# Patient Record
Sex: Female | Born: 1985
Health system: Southern US, Community
[De-identification: ages and names within clinical notes are randomized; demographics above are authoritative.]

## PROBLEM LIST (undated history)

## (undated) ENCOUNTER — Ambulatory Visit (HOSPITAL_COMMUNITY): Admission: EM | Payer: Self-pay

## (undated) ENCOUNTER — Inpatient Hospital Stay (HOSPITAL_COMMUNITY): Payer: Self-pay

## (undated) DIAGNOSIS — J40 Bronchitis, not specified as acute or chronic: Secondary | ICD-10-CM

## (undated) DIAGNOSIS — F419 Anxiety disorder, unspecified: Secondary | ICD-10-CM

## (undated) DIAGNOSIS — R252 Cramp and spasm: Secondary | ICD-10-CM

## (undated) DIAGNOSIS — F32A Depression, unspecified: Secondary | ICD-10-CM

## (undated) DIAGNOSIS — R51 Headache: Secondary | ICD-10-CM

## (undated) DIAGNOSIS — Z8489 Family history of other specified conditions: Secondary | ICD-10-CM

## (undated) DIAGNOSIS — R0602 Shortness of breath: Secondary | ICD-10-CM

## (undated) DIAGNOSIS — J302 Other seasonal allergic rhinitis: Secondary | ICD-10-CM

## (undated) DIAGNOSIS — F329 Major depressive disorder, single episode, unspecified: Secondary | ICD-10-CM

## (undated) HISTORY — PX: OTHER SURGICAL HISTORY: SHX169

---

## 2005-07-07 ENCOUNTER — Emergency Department (HOSPITAL_COMMUNITY): Admission: EM | Admit: 2005-07-07 | Discharge: 2005-07-07 | Payer: Self-pay | Admitting: Emergency Medicine

## 2007-05-08 ENCOUNTER — Emergency Department (HOSPITAL_COMMUNITY): Admission: EM | Admit: 2007-05-08 | Discharge: 2007-05-08 | Payer: Self-pay | Admitting: Emergency Medicine

## 2010-09-22 ENCOUNTER — Emergency Department (HOSPITAL_COMMUNITY)
Admission: EM | Admit: 2010-09-22 | Discharge: 2010-09-22 | Disposition: A | Discharge status: Home / Self Care | Payer: 59 | Attending: Emergency Medicine | Admitting: Emergency Medicine

## 2010-09-22 ENCOUNTER — Emergency Department (HOSPITAL_COMMUNITY): Payer: 59

## 2010-09-22 DIAGNOSIS — J069 Acute upper respiratory infection, unspecified: Secondary | ICD-10-CM | POA: Insufficient documentation

## 2010-09-22 DIAGNOSIS — R0989 Other specified symptoms and signs involving the circulatory and respiratory systems: Secondary | ICD-10-CM | POA: Insufficient documentation

## 2010-09-22 DIAGNOSIS — R059 Cough, unspecified: Secondary | ICD-10-CM | POA: Insufficient documentation

## 2010-09-22 DIAGNOSIS — J3489 Other specified disorders of nose and nasal sinuses: Secondary | ICD-10-CM | POA: Insufficient documentation

## 2010-09-22 DIAGNOSIS — R0609 Other forms of dyspnea: Secondary | ICD-10-CM | POA: Insufficient documentation

## 2010-09-22 DIAGNOSIS — R05 Cough: Secondary | ICD-10-CM | POA: Insufficient documentation

## 2010-09-22 DIAGNOSIS — R062 Wheezing: Secondary | ICD-10-CM | POA: Insufficient documentation

## 2010-09-22 DIAGNOSIS — J309 Allergic rhinitis, unspecified: Secondary | ICD-10-CM | POA: Insufficient documentation

## 2011-03-08 LAB — INFLUENZA A+B VIRUS AG-DIRECT(RAPID): Inflenza A Ag: NEGATIVE

## 2011-03-08 LAB — RAPID STREP SCREEN (MED CTR MEBANE ONLY): Streptococcus, Group A Screen (Direct): NEGATIVE

## 2011-04-01 ENCOUNTER — Emergency Department (HOSPITAL_COMMUNITY)
Admission: EM | Admit: 2011-04-01 | Discharge: 2011-04-01 | Disposition: A | Discharge status: Home / Self Care | Payer: Self-pay | Attending: Emergency Medicine | Admitting: Emergency Medicine

## 2011-04-01 DIAGNOSIS — J3489 Other specified disorders of nose and nasal sinuses: Secondary | ICD-10-CM | POA: Insufficient documentation

## 2011-04-01 DIAGNOSIS — J339 Nasal polyp, unspecified: Secondary | ICD-10-CM | POA: Insufficient documentation

## 2011-05-08 ENCOUNTER — Emergency Department (HOSPITAL_COMMUNITY)
Admission: EM | Admit: 2011-05-08 | Discharge: 2011-05-08 | Disposition: A | Discharge status: Home / Self Care | Payer: Self-pay | Attending: Emergency Medicine | Admitting: Emergency Medicine

## 2011-05-08 ENCOUNTER — Encounter: Payer: Self-pay | Admitting: Emergency Medicine

## 2011-05-08 DIAGNOSIS — R51 Headache: Secondary | ICD-10-CM | POA: Insufficient documentation

## 2011-05-08 DIAGNOSIS — K089 Disorder of teeth and supporting structures, unspecified: Secondary | ICD-10-CM | POA: Insufficient documentation

## 2011-05-08 DIAGNOSIS — K0889 Other specified disorders of teeth and supporting structures: Secondary | ICD-10-CM

## 2011-05-08 MED ORDER — OXYCODONE-ACETAMINOPHEN 5-325 MG PO TABS
1.0000 | ORAL_TABLET | Freq: Once | ORAL | Status: AC
  Administered 2011-05-08: 1 via ORAL
  Filled 2011-05-08: qty 1

## 2011-05-08 MED ORDER — HYDROCODONE-ACETAMINOPHEN 7.5-500 MG/15ML PO SOLN: 15.0000 mL | Freq: Four times a day (QID) | ORAL | Status: AC | PRN

## 2011-05-08 NOTE — ED Provider Notes (Signed)
History     CSN: [id number] Arrival date & time: 05/08/2011  1:45 PM   First MD Initiated Contact with Patient 05/08/11 1427      Chief Complaint  Patient presents with  . Dental Pain    r/upper mouth pain . Hx of same    (Consider location/radiation/quality/duration/timing/severity/associated sxs/prior treatment) HPI Comments: Patient with three-day history of pain in the area of her right second maxillary premolar. She's been treating at home with [facility] powder however this has not helped. She denies facial swelling or trouble breathing. She has had this tooth removed in the past. She has had pain like this in the past. No fever.   Patient is a 25 y.o. female presenting with tooth pain. The history is provided by the patient.  Dental PainThe primary symptoms include mouth pain and headaches. Primary symptoms do not include dental injury, fever, shortness of breath or sore throat. The symptoms began 3 to 5 days ago.  Additional symptoms do not include: facial swelling, trouble swallowing and ear pain.    History reviewed. No pertinent past medical history.  History reviewed. No pertinent past surgical history.  Family History  Problem Relation Age of Onset  . Diabetes Mother   . Cancer Mother   . Hypertension Mother     History  Substance Use Topics  . Smoking status: Never Smoker   . Smokeless tobacco: Not on file  . Alcohol Use: Yes    OB History    Grav Para Term Preterm Abortions TAB SAB Ect Mult Living                  Review of Systems  Constitutional: Negative for fever and chills.  HENT: Negative for ear pain, sore throat, facial swelling, rhinorrhea and trouble swallowing.        Positive for dental pain  Respiratory: Negative for shortness of breath.   Neurological: Positive for headaches.    Allergies  Review of patient's allergies indicates no known allergies.  Home Medications   Current Outpatient Rx  Name Route Sig Dispense Refill  .  ASPIRIN-ACETAMINOPHEN 500-325 MG PO PACK Oral Take 1 packet by mouth every 6 (six) hours as needed. pain     . MOMETASONE FUROATE 50 MCG/ACT NA SUSP Nasal Place 2 sprays into the nose daily.        BP 125/74  Pulse 94  Temp(Src) 97.9 F (36.6 C) (Oral)  Resp 18  SpO2 100%  LMP 04/14/2011  Physical Exam  Nursing note and vitals reviewed. Constitutional: She is oriented to person, place, and time. She appears well-developed and well-nourished.  HENT:  Head: Normocephalic and atraumatic.  Right Ear: External ear normal.  Left Ear: External ear normal.       Patient with R maxillary tooth pain and tenderness to palpation in area of second premolar. No swelling or erythema noted on exam.  Eyes: Conjunctivae are normal.  Neck: Normal range of motion. Neck supple.       No Ludwig's angina  Lymphadenopathy:    She has no cervical adenopathy.  Neurological: She is alert and oriented to person, place, and time.  Skin: Skin is warm and dry.    ED Course  Procedures (including critical care time)  Labs Reviewed - No data to display No results found.   1. Pain, dental     2:50 PM patient seen and examined. Will treat with pain medicine. Urged followup with dentist.  2:50 PM Patient counseled on use of  narcotic pain medications. Counseled not to combine these medications with others containing tylenol. Urged not to drink alcohol, drive, or perform any other activities that requires focus while taking these medications. The patient verbalizes understanding and agrees with the plan.   MDM  Patient with toothache.  No gross abscess.  Exam unconcerning for Ludwig's angina or other deep tissue infection in neck.  Will treat with penicillin and pain medicine.  Urged patient to follow-up with dentist.          Carolee Rota, PA 05/08/11 [phone]

## 2011-05-08 NOTE — ED Provider Notes (Signed)
Medical screening examination/treatment/procedure(s) were performed by non-physician practitioner and as supervising physician I was immediately available for consultation/collaboration.  Geoffery Lyons, MD 05/08/11 [phone]

## 2011-06-28 ENCOUNTER — Encounter (HOSPITAL_COMMUNITY): Payer: Self-pay | Admitting: *Deleted

## 2011-06-28 ENCOUNTER — Emergency Department (HOSPITAL_COMMUNITY)
Admission: EM | Admit: 2011-06-28 | Discharge: 2011-06-28 | Disposition: A | Discharge status: Home / Self Care | Payer: Self-pay | Attending: Emergency Medicine | Admitting: Emergency Medicine

## 2011-06-28 DIAGNOSIS — K089 Disorder of teeth and supporting structures, unspecified: Secondary | ICD-10-CM | POA: Insufficient documentation

## 2011-06-28 DIAGNOSIS — K120 Recurrent oral aphthae: Secondary | ICD-10-CM | POA: Insufficient documentation

## 2011-06-28 DIAGNOSIS — K0889 Other specified disorders of teeth and supporting structures: Secondary | ICD-10-CM

## 2011-06-28 MED ORDER — PENICILLIN V POTASSIUM 250 MG PO TABS: 250.0000 mg | ORAL_TABLET | Freq: Four times a day (QID) | ORAL | Status: AC

## 2011-06-28 MED ORDER — LIDOCAINE VISCOUS 2 % MT SOLN: 5.0000 mL | OROMUCOSAL | Status: AC | PRN

## 2011-06-28 NOTE — ED Notes (Signed)
Pt states she has a abscess on the left side on her mouth. Pt states she also has a bump on the inside of her lip. Pt states bump is painful when she is eating. Pt states she was suppose to get surgery in June of 2012 but she lost her insurance

## 2011-06-28 NOTE — ED Provider Notes (Signed)
History     CSN: [id number]  Arrival date & time 06/28/11  1507   First MD Initiated Contact with Patient 06/28/11 1631      No chief complaint on file.   (Consider location/radiation/quality/duration/timing/severity/associated sxs/prior treatment) Patient is a 26 y.o. female presenting with tooth pain. The history is provided by the patient. No language interpreter was used.  Dental PainThe symptoms began 12 to 24 hours ago. The symptoms are worsening. The symptoms occur constantly.  Additional symptoms include: gum swelling, gum tenderness and dry mouth. Additional symptoms do not include: dental sensitivity to temperature, purulent gums, jaw pain, facial swelling, trouble swallowing, pain with swallowing, excessive salivation, ear pain, hearing loss and swollen glands. Medical issues do not include: alcohol problem.   Gum swelling to R lower molar.  Cancre sore to lower inner lip x 2 days.  Pain 4-8/10. History reviewed. No pertinent past medical history.  History reviewed. No pertinent past surgical history.  Family History  Problem Relation Age of Onset  . Diabetes Mother   . Cancer Mother   . Hypertension Mother     History  Substance Use Topics  . Smoking status: Never Smoker   . Smokeless tobacco: Not on file  . Alcohol Use: Yes    OB History    Grav Para Term Preterm Abortions TAB SAB Ect Mult Living                  Review of Systems  HENT: Negative for hearing loss, ear pain, facial swelling and trouble swallowing.   All other systems reviewed and are negative.    Allergies  Review of patient's allergies indicates no known allergies.  Home Medications   Current Outpatient Rx  Name Route Sig Dispense Refill  . MOMETASONE FUROATE 50 MCG/ACT NA SUSP Nasal Place 2 sprays into the nose daily as needed. For congestion.    Marland Kitchen VITAMIN C 500 MG PO TABS Oral Take 500 mg by mouth daily.      BP 127/62  Pulse 90  Temp(Src) 98 F (36.7 C) (Oral)  Resp 20   Ht 5\' 5"  (1.651 m)  Wt 195 lb (88.451 kg)  BMI 32.45 kg/m2  SpO2 100%  LMP 06/02/2011  Physical Exam  Nursing note and vitals reviewed. Constitutional: She is oriented to person, place, and time. She appears well-developed and well-nourished.  HENT:  Head: Normocephalic and atraumatic.  Mouth/Throat: Mucous membranes are dry. No dental abscesses.    Eyes: Conjunctivae are normal. Pupils are equal, round, and reactive to light.  Neck: Normal range of motion. Neck supple.  Cardiovascular: Normal rate.   Pulmonary/Chest: Effort normal.  Abdominal: Soft.  Musculoskeletal: Normal range of motion. She exhibits no edema and no tenderness.  Neurological: She is alert and oriented to person, place, and time.  Skin: Skin is warm and dry.  Psychiatric: She has a normal mood and affect.    ED Course  Procedures (including critical care time)  Labs Reviewed - No data to display No results found.   No diagnosis found.    MDM  Gum swellin to lower molar and caker sore to lower inner lip.  PCN with dental follow up.  Lidocaine 2% for pain.  Refused ibuprofen in the ER.           Jethro Bastos, NP 06/28/11 1756

## 2011-06-29 NOTE — ED Provider Notes (Signed)
Medical screening examination/treatment/procedure(s) were performed by non-physician practitioner and as supervising physician I was immediately available for consultation/collaboration.  Joshual Terrio R. Neymar Dowe, MD 06/29/11 0045 

## 2011-08-07 ENCOUNTER — Encounter (HOSPITAL_COMMUNITY): Payer: Self-pay | Admitting: *Deleted

## 2011-08-07 ENCOUNTER — Emergency Department (HOSPITAL_COMMUNITY)
Admission: EM | Admit: 2011-08-07 | Discharge: 2011-08-07 | Disposition: A | Discharge status: Home / Self Care | Payer: Self-pay | Source: Home / Self Care | Attending: Emergency Medicine | Admitting: Emergency Medicine

## 2011-08-07 DIAGNOSIS — J329 Chronic sinusitis, unspecified: Secondary | ICD-10-CM

## 2011-08-07 DIAGNOSIS — J45909 Unspecified asthma, uncomplicated: Secondary | ICD-10-CM

## 2011-08-07 HISTORY — DX: Other seasonal allergic rhinitis: J30.2

## 2011-08-07 MED ORDER — ALBUTEROL SULFATE (5 MG/ML) 0.5% IN NEBU
INHALATION_SOLUTION | RESPIRATORY_TRACT | Status: AC
  Filled 2011-08-07: qty 1

## 2011-08-07 MED ORDER — METHYLPREDNISOLONE SODIUM SUCC 125 MG IJ SOLR
INTRAMUSCULAR | Status: AC
  Filled 2011-08-07: qty 2

## 2011-08-07 MED ORDER — ALBUTEROL SULFATE (5 MG/ML) 0.5% IN NEBU
5.0000 mg | INHALATION_SOLUTION | Freq: Once | RESPIRATORY_TRACT | Status: AC
  Administered 2011-08-07: 5 mg via RESPIRATORY_TRACT

## 2011-08-07 MED ORDER — GUAIFENESIN-CODEINE 100-10 MG/5ML PO SYRP: 10.0000 mL | ORAL_SOLUTION | Freq: Four times a day (QID) | ORAL | Status: AC | PRN

## 2011-08-07 MED ORDER — BECLOMETHASONE DIPROPIONATE 80 MCG/ACT IN AERS: 2.0000 | INHALATION_SPRAY | Freq: Two times a day (BID) | RESPIRATORY_TRACT | Status: DC

## 2011-08-07 MED ORDER — ALBUTEROL SULFATE HFA 108 (90 BASE) MCG/ACT IN AERS: 1.0000 | INHALATION_SPRAY | Freq: Four times a day (QID) | RESPIRATORY_TRACT | Status: DC | PRN

## 2011-08-07 MED ORDER — METHYLPREDNISOLONE SODIUM SUCC 125 MG IJ SOLR
125.0000 mg | Freq: Once | INTRAMUSCULAR | Status: AC
  Administered 2011-08-07: 125 mg via INTRAMUSCULAR

## 2011-08-07 MED ORDER — PREDNISONE 10 MG PO TABS: ORAL_TABLET | ORAL | Status: DC

## 2011-08-07 MED ORDER — IPRATROPIUM BROMIDE 0.02 % IN SOLN
0.5000 mg | Freq: Once | RESPIRATORY_TRACT | Status: AC
  Administered 2011-08-07: 0.5 mg via RESPIRATORY_TRACT

## 2011-08-07 MED ORDER — AZITHROMYCIN 250 MG PO TABS: ORAL_TABLET | ORAL | Status: AC

## 2011-08-07 NOTE — ED Provider Notes (Signed)
Chief Complaint  Patient presents with  . Nasal Congestion  . Cough  . Shortness of Breath    History of Present Illness:   The patient is a 26 year old female who has had a one-week history of cough productive of yellow sputum, trouble breathing, chest tightness, wheezing, aching in her chest and back, nasal congestion with yellowish bloody drainage, and headache. She denies any fever, chills, or sore throat. No GI complaints. No definite history of asthma in the past, however she had a similar episode with an upper respiratory infection about a year ago and she was given an albuterol inhaler which did seem to help.  Review of Systems:  Other than noted above, the patient denies any of the following symptoms. Systemic:  No fever, chills, sweats, fatigue, myalgias, headache, or anorexia. Eye:  No redness, pain or drainage. ENT:  No earache, nasal congestion, rhinorrhea, sinus pressure, or sore throat. Lungs:  No cough, sputum production, wheezing, shortness of breath. Or chest pain. GI:  No nausea, vomiting, abdominal pain or diarrhea. Skin:  No rash or itching.  PMFSH:  Past medical history, family history, social history, meds, and allergies were reviewed.  Physical Exam:   Vital signs:  BP 127/82  Pulse 91  Temp(Src) 97.4 F (36.3 C) (Oral)  Resp 20  SpO2 100%  LMP 07/26/2011 General:  Alert, in no distress. Eye:  No conjunctival injection or drainage. ENT:  TMs and canals were normal, without erythema or inflammation.  Nasal mucosa was clear and uncongested, without drainage.  Mucous membranes were moist.  Pharynx was clear, without exudate or drainage.  There were no oral ulcerations or lesions. Neck:  Supple, no adenopathy, tenderness or mass. Lungs:  No respiratory distress.  Lungs were clear to auscultation, without wheezes, rales or rhonchi.  Breath sounds were clear and equal bilaterally. She had no wheezes but lungs were very quiet and air movement was poor. Heart:  Regular  rhythm, without gallops, murmers or rubs. Skin:  Clear, warm, and dry, without rash or lesions.  Course in Urgent Care Center:   She was given a DuoNeb breathing treatment by nebulizer and Solu-Medrol 125 mg IM. Thereafter she felt better and her lungs sounded better. Still no wheezes but better air movement, still not quite up to normal but improved. She felt like she could go home at this point.  Assessment:   Diagnoses that have been ruled out:  None  Diagnoses that are still under consideration:  None  Final diagnoses:  Asthmatic bronchitis - I think she has mild asthma that is exacerbated by a viral upper respiratory infections and will treat as such and suggested she followup with her primary care doctor with regard to a formal asthma workup.   Sinusitis      Plan:   1.  The following meds were prescribed:   New Prescriptions   ALBUTEROL (PROVENTIL HFA;VENTOLIN HFA) 108 (90 BASE) MCG/ACT INHALER    Inhale 1-2 puffs into the lungs every 6 (six) hours as needed for wheezing.   AZITHROMYCIN (ZITHROMAX Z-PAK) 250 MG TABLET    Take as directed.   BECLOMETHASONE (QVAR) 80 MCG/ACT INHALER    Inhale 2 puffs into the lungs 2 (two) times daily.   GUAIFENESIN-CODEINE (GUIATUSS AC) 100-10 MG/5ML SYRUP    Take 10 mLs by mouth 4 (four) times daily as needed for cough.   PREDNISONE (DELTASONE) 10 MG TABLET    Take 4 tabs daily for 4 days, 3 tabs daily for 4 days,  2 tabs daily for 4 days, then 1 tab daily for 4 days.   2.  The patient was instructed in symptomatic care and handouts were given. 3.  The patient was told to return if becoming worse in any way, if no better in 3 or 4 days, and given some red flag symptoms that would indicate earlier return.   Reuben Likes, MD 08/07/11 [phone]

## 2011-08-07 NOTE — ED Notes (Signed)
Pt with onset of cough/congestion/x one week - chest sore with coughing - taking otc medications /albuterol inhaler

## 2011-08-07 NOTE — ED Notes (Signed)
Breathing treatment complete - per pt breathing easier

## 2011-08-07 NOTE — Discharge Instructions (Signed)

## 2011-09-27 ENCOUNTER — Emergency Department (HOSPITAL_COMMUNITY)
Admission: EM | Admit: 2011-09-27 | Discharge: 2011-09-27 | Disposition: A | Discharge status: Home / Self Care | Payer: Self-pay | Source: Home / Self Care | Attending: Emergency Medicine | Admitting: Emergency Medicine

## 2011-09-27 ENCOUNTER — Encounter (HOSPITAL_COMMUNITY): Payer: Self-pay | Admitting: Emergency Medicine

## 2011-09-27 DIAGNOSIS — J45901 Unspecified asthma with (acute) exacerbation: Secondary | ICD-10-CM

## 2011-09-27 DIAGNOSIS — J209 Acute bronchitis, unspecified: Secondary | ICD-10-CM

## 2011-09-27 HISTORY — DX: Bronchitis, not specified as acute or chronic: J40

## 2011-09-27 MED ORDER — ALBUTEROL SULFATE (5 MG/ML) 0.5% IN NEBU
INHALATION_SOLUTION | RESPIRATORY_TRACT | Status: AC
  Filled 2011-09-27: qty 1

## 2011-09-27 MED ORDER — PREDNISONE 20 MG PO TABS
ORAL_TABLET | ORAL | Status: AC
  Filled 2011-09-27: qty 2

## 2011-09-27 MED ORDER — IPRATROPIUM BROMIDE 0.02 % IN SOLN
0.5000 mg | Freq: Once | RESPIRATORY_TRACT | Status: AC
  Administered 2011-09-27: 0.5 mg via RESPIRATORY_TRACT

## 2011-09-27 MED ORDER — PREDNISONE 20 MG PO TABS
40.0000 mg | ORAL_TABLET | Freq: Once | ORAL | Status: AC
  Administered 2011-09-27: 40 mg via ORAL

## 2011-09-27 MED ORDER — PREDNISONE 20 MG PO TABS: 40.0000 mg | ORAL_TABLET | Freq: Every day | ORAL | Status: AC

## 2011-09-27 MED ORDER — ALBUTEROL SULFATE HFA 108 (90 BASE) MCG/ACT IN AERS: 2.0000 | INHALATION_SPRAY | Freq: Four times a day (QID) | RESPIRATORY_TRACT | Status: DC | PRN

## 2011-09-27 MED ORDER — ALBUTEROL SULFATE (5 MG/ML) 0.5% IN NEBU
2.5000 mg | INHALATION_SOLUTION | Freq: Once | RESPIRATORY_TRACT | Status: AC
  Administered 2011-09-27: 2.5 mg via RESPIRATORY_TRACT

## 2011-09-27 NOTE — Discharge Instructions (Signed)
Asthma, Acute Bronchospasm  Your exam shows you have asthma, or acute bronchospasm that acts like asthma. Bronchospasm means your air passages become narrowed. These conditions are due to inflammation and airway spasm that cause narrowing of the bronchial tubes in the lungs. This causes you to have wheezing and shortness of breath.  CAUSES    Respiratory infections and allergies most often bring on these attacks. Smoking, air pollution, cold air, emotional upsets, and vigorous exercise can also bring them on.    TREATMENT     Treatment is aimed at making the narrowed airways larger. Mild asthma/bronchospasm is usually controlled with inhaled medicines. Albuterol is a common medicine that you breathe in to open spastic or narrowed airways. Some trade names for albuterol are Ventolin or Proventil. Steroid medicine is also used to reduce the inflammation when an attack is moderate or severe. Antibiotics (medications used to kill germs) are only used if a bacterial infection is present.    If you are pregnant and need to use Albuterol (Ventolin or Proventil), you can expect the baby to move more than usual shortly after the medicine is used.   HOME CARE INSTRUCTIONS     Rest.    Drink plenty of liquids. This helps the mucus to remain thin and easily coughed up. Do not use caffeine or alcohol.    Do not smoke. Avoid being exposed to second-hand smoke.    You play a critical role in keeping yourself in good health. Avoid exposure to things that cause you to wheeze. Avoid exposure to things that cause you to have breathing problems. Keep your medications up-to-date and available. Carefully follow your doctor's treatment plan.    When pollen or pollution is bad, keep windows closed and use an air conditioner go to places with air conditioning. If you are allergic to furry pets or birds, find new homes for them or keep them outside.    Take your medicine exactly as prescribed.     Asthma requires careful medical attention. See your caregiver for follow-up as advised. If you are more than [redacted] weeks pregnant and you were prescribed any new medications, let your Obstetrician know about the visit and how you are doing. Arrange a recheck.   SEEK IMMEDIATE MEDICAL CARE IF:     You are getting worse.    You have trouble breathing. If severe, call 911.    You develop chest pain or discomfort.    You are throwing up or not drinking fluids.    You are not getting better within 24 hours.    You are coughing up yellow, green, brown, or bloody sputum.    You develop a fever over 102 F (38.9 C).    You have trouble swallowing.   MAKE SURE YOU:     Understand these instructions.    Will watch your condition.    Will get help right away if you are not doing well or get worse.   Document Released: 09/01/2006 Document Revised: 05/06/2011 Document Reviewed: 05/01/2007  [employer] Patient Information 2012 [employer].

## 2011-09-27 NOTE — ED Provider Notes (Signed)
History     CSN: [id number]  Arrival date & time 09/27/11  1109   First MD Initiated Contact with Patient 09/27/11 1206      Chief Complaint  Patient presents with  . Allergies  . Asthma    (Consider location/radiation/quality/duration/timing/severity/associated sxs/prior treatment) HPI Comments: Patient presents urgent care today as he been struggling with her asthma coughing with phlegm and congestion occasionally sees yellow sputum. Most of her symptoms are worse at night when she coughs and wheeze and have been using her inhaler very frequently up to 6 or 8 times a day. She continues to take Zyrtec and use a nasal steroid spray that was previously prescribed to her but still expressing symptoms some days better than others. She also described to [place] today that she has been unable to feel her a prescription for an inhaled steroid as it was too expensive even at [facility]. She presents today complaining of shortness of breath, wheezing and productive cough. No fevers,.  Patient is a 26 y.o. female presenting with asthma. The history is provided by the patient.  Asthma This is a recurrent problem. The current episode started more than 1 week ago. The problem occurs constantly. The problem has not changed since onset.Associated symptoms include shortness of breath. Pertinent negatives include no chest pain. The symptoms are aggravated by coughing and exertion. The symptoms are relieved by nothing. She has tried nothing for the symptoms.    Past Medical History  Diagnosis Date  . Seasonal allergies   . Asthma   . Bronchitis     History reviewed. No pertinent past surgical history.  Family History  Problem Relation Age of Onset  . Diabetes Mother   . Cancer Mother   . Hypertension Mother     History  Substance Use Topics  . Smoking status: Never Smoker   . Smokeless tobacco: Not on file  . Alcohol Use: Yes    OB History    Grav Para Term Preterm Abortions TAB SAB Ect Mult  Living                  Review of Systems  Constitutional: Negative for fever, diaphoresis, activity change, appetite change and fatigue.  HENT: Negative for facial swelling and neck pain.   Eyes: Negative for redness.  Respiratory: Positive for chest tightness, shortness of breath and wheezing.   Cardiovascular: Negative for chest pain, palpitations and leg swelling.  Gastrointestinal: Negative for abdominal distention.    Allergies  Review of patient's allergies indicates no known allergies.  Home Medications   Current Outpatient Rx  Name Route Sig Dispense Refill  . ALBUTEROL SULFATE HFA 108 (90 BASE) MCG/ACT IN AERS Inhalation Inhale 1-2 puffs into the lungs every 6 (six) hours as needed for wheezing. 1 Inhaler 0  . ALBUTEROL SULFATE HFA 108 (90 BASE) MCG/ACT IN AERS Inhalation Inhale 2 puffs into the lungs every 6 (six) hours as needed. 3.7 g 3  . BECLOMETHASONE DIPROPIONATE 80 MCG/ACT IN AERS Inhalation Inhale 2 puffs into the lungs 2 (two) times daily. 1 Inhaler 0  . GUAIFENESIN 100 MG/5ML PO SYRP Oral Take 200 mg by mouth 3 (three) times daily as needed.    . MOMETASONE FUROATE 50 MCG/ACT NA SUSP Nasal Place 2 sprays into the nose daily as needed. For congestion.    Marland Kitchen ALKA-SELTZER PLUS COLD & COUGH PO Oral Take by mouth.    Marland Kitchen PREDNISONE 10 MG PO TABS  Take 4 tabs daily for 4 days, 3  tabs daily for 4 days, 2 tabs daily for 4 days, then 1 tab daily for 4 days. 40 tablet 0  . PREDNISONE 20 MG PO TABS Oral Take 2 tablets (40 mg total) by mouth daily. 2 tablets daily for 5 days 14 tablet 0  . VITAMIN C 500 MG PO TABS Oral Take 500 mg by mouth daily.      BP 132/88  Pulse 90  Temp(Src) 97.9 F (36.6 C) (Oral)  Resp 18  SpO2 100%  Physical Exam  Nursing note and vitals reviewed. Constitutional: She appears well-developed and well-nourished.  Non-toxic appearance. She does not have a sickly appearance. She does not appear ill. No distress.  HENT:  Head: Normocephalic.    Eyes: Conjunctivae are normal.  Neck: Normal range of motion. Neck supple. No JVD present.  Cardiovascular: Normal rate.   Pulmonary/Chest: Effort normal. No accessory muscle usage. Not tachypneic. She has decreased breath sounds. She has wheezes. She has no rhonchi. She has no rales. She exhibits no tenderness.  Abdominal: Soft.  Lymphadenopathy:    She has no cervical adenopathy.  Skin: No erythema.    ED Course  Procedures (including critical care time)  Labs Reviewed - No data to display No results found.   1. Bronchitis with asthma, acute       MDM  Patient continues with seasonal allergies and exacerbation of her asthma. Was unable to start with inhale steroid as previously prescribed based on her demand for albuterol and she does not have the financial resources to obtain it. Have encouraged her to continue taking antihistamines and nasal steroid and have refill her albuterol. Have encouraged her to try to get this prescription Qvar as that will probably be very helpful. Was clinically improved after breathing treatments at [facility] and had started her with a course of prednisone for 7 days. Patient agree with treatment plan and followup care as necessary        Jimmie Molly, MD 09/27/11 1448

## 2011-09-27 NOTE — ED Notes (Signed)
PT HERE WITH ALLERGY SX THAT STARTED X 1 WEEK AGO AND TRIGGERED ASTHMA SX CONSTANT COUGH WITH THICK,DK YELLOW MUCOUS AND SOB THAT WORSENS AT NIGHT.PT WAS SEEN AND TREATED HERE LAST MNTH FOR BRONCHITIS AND PRESCRIBED VENTOLIN INHALER,CHERATUSSIN BUT STATES ITS NOT WORKING. AUDIBLE WHEEZES WITH C/O CHEST TIGHTNESS.

## 2011-09-29 ENCOUNTER — Telehealth (HOSPITAL_COMMUNITY): Payer: Self-pay | Admitting: *Deleted

## 2011-09-29 NOTE — ED Notes (Signed)
Dr. Lorenz Coaster sent me a message that pt. can have 1 refill of her Q-var but will have to get further refills from her PCP. I saw note by [name] today and called pt. She said the pharmacy found the original Rx. and it has been filled now. Julie Cardenas 09/29/2011

## 2011-09-29 NOTE — ED Notes (Signed)
Pt was seen here 08/07/11 and Dr. Lorenz Coaster gave her an Rx for Qvar.  She says she didn't have the money to fill the Rx at the time and since then the pharmacy lost it.  Requesting the Rx to be called in the pharmacy.  Message sent to Dr. Lorenz Coaster.

## 2012-01-02 ENCOUNTER — Encounter (HOSPITAL_COMMUNITY): Payer: Self-pay | Admitting: *Deleted

## 2012-01-02 ENCOUNTER — Other Ambulatory Visit: Payer: Self-pay

## 2012-01-02 ENCOUNTER — Emergency Department (HOSPITAL_COMMUNITY)
Admission: EM | Admit: 2012-01-02 | Discharge: 2012-01-02 | Disposition: A | Discharge status: Home / Self Care | Payer: Self-pay | Attending: Emergency Medicine | Admitting: Emergency Medicine

## 2012-01-02 ENCOUNTER — Emergency Department (HOSPITAL_COMMUNITY): Payer: Self-pay

## 2012-01-02 DIAGNOSIS — J329 Chronic sinusitis, unspecified: Secondary | ICD-10-CM

## 2012-01-02 DIAGNOSIS — J45909 Unspecified asthma, uncomplicated: Secondary | ICD-10-CM | POA: Insufficient documentation

## 2012-01-02 DIAGNOSIS — I498 Other specified cardiac arrhythmias: Secondary | ICD-10-CM | POA: Insufficient documentation

## 2012-01-02 DIAGNOSIS — F411 Generalized anxiety disorder: Secondary | ICD-10-CM | POA: Insufficient documentation

## 2012-01-02 DIAGNOSIS — F419 Anxiety disorder, unspecified: Secondary | ICD-10-CM

## 2012-01-02 LAB — POCT I-STAT, CHEM 8
Calcium, Ion: 1.18 mmol/L (ref 1.12–1.23)
Glucose, Bld: 105 mg/dL — ABNORMAL HIGH (ref 70–99)
HCT: 46 % (ref 36.0–46.0)
Sodium: 139 mEq/L (ref 135–145)

## 2012-01-02 LAB — POCT PREGNANCY, URINE: Preg Test, Ur: NEGATIVE

## 2012-01-02 MED ORDER — MOMETASONE FUROATE 50 MCG/ACT NA SUSP: 2.0000 | Freq: Every day | NASAL | Status: DC

## 2012-01-02 MED ORDER — ALPRAZOLAM 0.25 MG PO TABS: 0.2500 mg | ORAL_TABLET | Freq: Every evening | ORAL | Status: AC | PRN

## 2012-01-02 MED ORDER — AZITHROMYCIN 250 MG PO TABS: 250.0000 mg | ORAL_TABLET | Freq: Every day | ORAL | Status: AC

## 2012-01-02 MED ORDER — OXYMETAZOLINE HCL 0.05 % NA SOLN: 2.0000 | Freq: Two times a day (BID) | NASAL | Status: AC

## 2012-01-02 NOTE — ED Notes (Signed)
Reports having nasal congestion, cough and sinus problems for over two weeks. Having upper chest pains, denies pain occuring when coughing. ekg being done at triage, no acute distress noted.

## 2012-01-02 NOTE — ED Provider Notes (Signed)
History     CSN: [id number]  Arrival date & time 01/02/12  1518   First MD Initiated Contact with Patient 01/02/12 1726      Chief Complaint  Patient presents with  . Nasal Congestion  . Chest Pain    (Consider location/radiation/quality/duration/timing/severity/associated sxs/prior treatment) HPI Pt has had 2 weeks of sinus/nasal congestion. No fevers, chills. Intermittent cough. No SOB, wheezing. Pt also c/o chest pressure intermittently over the last 2 days. Pressure associated with stress and she has been under increased stress recently. No pain currently. No cardiac or PE risk factors.  Past Medical History  Diagnosis Date  . Seasonal allergies   . Asthma   . Bronchitis     History reviewed. No pertinent past surgical history.  Family History  Problem Relation Age of Onset  . Diabetes Mother   . Cancer Mother   . Hypertension Mother     History  Substance Use Topics  . Smoking status: Never Smoker   . Smokeless tobacco: Not on file  . Alcohol Use: Yes    OB History    Grav Para Term Preterm Abortions TAB SAB Ect Mult Living                  Review of Systems  Constitutional: Negative for fever and chills.  HENT: Positive for congestion and sinus pressure. Negative for sore throat and neck stiffness.   Respiratory: Positive for cough. Negative for shortness of breath, wheezing and stridor.   Cardiovascular: Positive for chest pain. Negative for palpitations and leg swelling.  Gastrointestinal: Negative for nausea, vomiting and abdominal pain.  Musculoskeletal: Negative for back pain.  Skin: Negative for rash.  Neurological: Negative for dizziness, weakness, numbness and headaches.    Allergies  Review of patient's allergies indicates no known allergies.  Home Medications   Current Outpatient Rx  Name Route Sig Dispense Refill  . ALBUTEROL SULFATE HFA 108 (90 BASE) MCG/ACT IN AERS Inhalation Inhale 1-2 puffs into the lungs every 6 (six) hours as  needed for wheezing. 1 Inhaler 0  . BECLOMETHASONE DIPROPIONATE 80 MCG/ACT IN AERS Inhalation Inhale 2 puffs into the lungs 2 (two) times daily. 1 Inhaler 0  . GUAIFENESIN-DM 100-10 MG/5ML PO SYRP Oral Take 5 mLs by mouth 3 (three) times daily as needed. For congestion    . ALKA-SELTZER PLUS COLD & COUGH PO Oral Take 1 tablet by mouth 2 (two) times daily.     Marland Kitchen ALPRAZOLAM 0.25 MG PO TABS Oral Take 1 tablet (0.25 mg total) by mouth at bedtime as needed for sleep. 30 tablet 0  . AZITHROMYCIN 250 MG PO TABS Oral Take 1 tablet (250 mg total) by mouth daily. Take first 2 tablets together, then 1 every day until finished. 6 tablet 0  . MOMETASONE FUROATE 50 MCG/ACT NA SUSP Nasal Place 2 sprays into the nose daily. 17 g 12  . OXYMETAZOLINE HCL 0.05 % NA SOLN Nasal Place 2 sprays into the nose 2 (two) times daily. 30 mL 0    BP 128/78  Pulse 92  Temp 97.3 F (36.3 C) (Oral)  Resp 20  SpO2 98%  LMP 12/27/2011  Physical Exam  Nursing note and vitals reviewed. Constitutional: She is oriented to person, place, and time. She appears well-developed and well-nourished. No distress.  HENT:  Head: Normocephalic and atraumatic.  Mouth/Throat: Oropharynx is clear and moist.       R frontal sinus TTP. +swollen nasal turbinates  Eyes: EOM are normal. Pupils are  equal, round, and reactive to light.  Neck: Normal range of motion. Neck supple.       No meningimus  Cardiovascular: Normal rate and regular rhythm.   Pulmonary/Chest: Effort normal and breath sounds normal. No respiratory distress. She has no wheezes. She has no rales. She exhibits no tenderness.  Abdominal: Soft. Bowel sounds are normal. She exhibits no mass. There is no tenderness. There is no rebound and no guarding.  Musculoskeletal: Normal range of motion. She exhibits no edema and no tenderness.       No calf swelling or pain  Neurological: She is alert and oriented to person, place, and time.  Skin: Skin is warm and dry. No rash noted.  No erythema.  Psychiatric: She has a normal mood and affect. Her behavior is normal.    ED Course  Procedures (including critical care time)  Labs Reviewed  POCT I-STAT, CHEM 8 - Abnormal; Notable for the following:    BUN 4 (*)     Glucose, Bld 105 (*)     Hemoglobin 15.6 (*)     All other components within normal limits  POCT I-STAT TROPONIN I  POCT PREGNANCY, URINE   Dg Chest 2 View  01/02/2012  *RADIOLOGY REPORT*  Clinical Data: Nasal congestion and chest pain.  CHEST - 2 VIEW  Comparison: Chest x-ray 09/22/2010.  Findings: Lung volumes are normal.  No consolidative airspace disease.  No pleural effusions.  No pneumothorax.  No pulmonary nodule or mass noted.  Pulmonary vasculature and the cardiomediastinal silhouette are within normal limits.  IMPRESSION: 1. No radiographic evidence of acute cardiopulmonary disease.  Original Report Authenticated By: Florencia Reasons, M.D.     1. Sinusitis   2. Anxiety       Date: 01/02/2012  Rate: 113  Rhythm: sinus tachycardia  QRS Axis: normal  Intervals: normal  ST/T Wave abnormalities: normal  Conduction Disutrbances:none  Narrative Interpretation:   Old EKG Reviewed: none available   MDM  Pt continues to be stable. HR in 90's. Blood work, CXR within normal limits. Will treat for sinusitis and anxiety. Return for worsening symptoms        Loren Racer, MD 01/02/12 1900

## 2012-06-26 ENCOUNTER — Encounter (HOSPITAL_COMMUNITY): Payer: Self-pay | Admitting: *Deleted

## 2012-06-26 ENCOUNTER — Emergency Department (HOSPITAL_COMMUNITY)
Admission: EM | Admit: 2012-06-26 | Discharge: 2012-06-26 | Disposition: A | Discharge status: Home / Self Care | Payer: Self-pay | Attending: Emergency Medicine | Admitting: Emergency Medicine

## 2012-06-26 DIAGNOSIS — R05 Cough: Secondary | ICD-10-CM

## 2012-06-26 DIAGNOSIS — J3489 Other specified disorders of nose and nasal sinuses: Secondary | ICD-10-CM | POA: Insufficient documentation

## 2012-06-26 DIAGNOSIS — R0982 Postnasal drip: Secondary | ICD-10-CM | POA: Insufficient documentation

## 2012-06-26 DIAGNOSIS — H9209 Otalgia, unspecified ear: Secondary | ICD-10-CM | POA: Insufficient documentation

## 2012-06-26 DIAGNOSIS — J45909 Unspecified asthma, uncomplicated: Secondary | ICD-10-CM | POA: Insufficient documentation

## 2012-06-26 DIAGNOSIS — R5381 Other malaise: Secondary | ICD-10-CM | POA: Insufficient documentation

## 2012-06-26 DIAGNOSIS — J029 Acute pharyngitis, unspecified: Secondary | ICD-10-CM | POA: Insufficient documentation

## 2012-06-26 DIAGNOSIS — J069 Acute upper respiratory infection, unspecified: Secondary | ICD-10-CM | POA: Insufficient documentation

## 2012-06-26 DIAGNOSIS — Z8709 Personal history of other diseases of the respiratory system: Secondary | ICD-10-CM | POA: Insufficient documentation

## 2012-06-26 DIAGNOSIS — Z79899 Other long term (current) drug therapy: Secondary | ICD-10-CM | POA: Insufficient documentation

## 2012-06-26 DIAGNOSIS — H7390 Unspecified disorder of tympanic membrane, unspecified ear: Secondary | ICD-10-CM | POA: Insufficient documentation

## 2012-06-26 MED ORDER — AZITHROMYCIN 250 MG PO TABS: 250.0000 mg | ORAL_TABLET | Freq: Every day | ORAL | Status: DC

## 2012-06-26 MED ORDER — ALBUTEROL SULFATE (5 MG/ML) 0.5% IN NEBU
5.0000 mg | INHALATION_SOLUTION | Freq: Once | RESPIRATORY_TRACT | Status: AC
  Administered 2012-06-26: 5 mg via RESPIRATORY_TRACT
  Filled 2012-06-26: qty 1

## 2012-06-26 MED ORDER — LORATADINE 10 MG PO TABS
10.0000 mg | ORAL_TABLET | Freq: Once | ORAL | Status: AC
  Administered 2012-06-26: 10 mg via ORAL
  Filled 2012-06-26: qty 1

## 2012-06-26 MED ORDER — IBUPROFEN 200 MG PO TABS
600.0000 mg | ORAL_TABLET | Freq: Once | ORAL | Status: DC
  Filled 2012-06-26: qty 3

## 2012-06-26 NOTE — ED Notes (Signed)
Pt reports productive cough x 1 week with "dark yellow" sputum.  Pt reports cough became severe for the last 3 days and has affected her asthma.  Pt reports using her inhaler and OTC cold meds without relief.  No know fever reported

## 2012-06-26 NOTE — ED Notes (Signed)
Please see triage note

## 2012-06-26 NOTE — ED Notes (Signed)
Pt was unable to take motrin pills.  Advised to take OTC for pain.

## 2012-06-27 NOTE — ED Provider Notes (Signed)
History     CSN: [id number]  Arrival date & time 06/26/12  1018   First MD Initiated Contact with Patient 06/26/12 1034      Chief Complaint  Patient presents with  . Cough    (Consider location/radiation/quality/duration/timing/severity/associated sxs/prior treatment) Patient is a 27 y.o. female presenting with URI. The history is provided by the patient. No language interpreter was used.  URI The primary symptoms include fatigue, sore throat and cough. Primary symptoms do not include fever, wheezing, nausea or vomiting. The current episode started more than 1 week ago. This is a new problem. The problem has been gradually worsening.  The sore throat is not accompanied by trouble swallowing.  Symptoms associated with the illness include congestion and rhinorrhea. The illness is not associated with chills or facial pain. Risk factors for severe complications from URI include chronic respiratory disease.  26yo with pmh asthma, bronchitis and seasonal allergies c/o upper respiratory symptoms x > 7 days and worsening cough x 3 days. Denies fever, nausea and vomiting.  Has tried nasonex, alka-selzer plus cold and cough and tylenol with minimal relief.  Out of her qvar.  Using albuterol inhaler 3-4 times a day.  Non toxic appearance.  Past Medical History  Diagnosis Date  . Seasonal allergies   . Asthma   . Bronchitis     History reviewed. No pertinent past surgical history.  Family History  Problem Relation Age of Onset  . Diabetes Mother   . Cancer Mother   . Hypertension Mother     History  Substance Use Topics  . Smoking status: Never Smoker   . Smokeless tobacco: Not on file  . Alcohol Use: Yes    OB History    Grav Para Term Preterm Abortions TAB SAB Ect Mult Living                  Review of Systems  Constitutional: Positive for fatigue. Negative for fever and chills.  HENT: Positive for congestion, sore throat, rhinorrhea, sneezing and postnasal drip. Negative  for trouble swallowing and neck pain.   Eyes: Negative.   Respiratory: Positive for cough. Negative for wheezing.   Cardiovascular: Negative.   Gastrointestinal: Negative.  Negative for nausea and vomiting.  Neurological: Negative.   Psychiatric/Behavioral: Negative.   All other systems reviewed and are negative.    Allergies  Review of patient's allergies indicates no known allergies.  Home Medications   Current Outpatient Rx  Name  Route  Sig  Dispense  Refill  . ACETAMINOPHEN 500 MG PO TABS   Oral   Take 500 mg by mouth every 6 (six) hours as needed. pain         . ALBUTEROL SULFATE HFA 108 (90 BASE) MCG/ACT IN AERS   Inhalation   Inhale 1-2 puffs into the lungs every 6 (six) hours as needed for wheezing.   1 Inhaler   0   . BECLOMETHASONE DIPROPIONATE 80 MCG/ACT IN AERS   Inhalation   Inhale 2 puffs into the lungs 2 (two) times daily.   1 Inhaler   0   . MOMETASONE FUROATE 50 MCG/ACT NA SUSP   Nasal   Place 2 sprays into the nose daily.   17 g   12   . ALKA-SELTZER PLUS COLD & COUGH PO   Oral   Take 1 tablet by mouth 2 (two) times daily.          . AZITHROMYCIN 250 MG PO TABS   Oral  Take 1 tablet (250 mg total) by mouth daily.   6 tablet   0     BP 134/45  Pulse 120  Temp 97.8 F (36.6 C) (Oral)  Resp 18  SpO2 95%  LMP 05/31/2012  Physical Exam  Nursing note and vitals reviewed. Constitutional: She is oriented to person, place, and time. She appears well-developed and well-nourished.  HENT:  Head: Normocephalic and atraumatic.  Right Ear: Tympanic membrane normal.  Left Ear: There is tenderness. Tympanic membrane is bulging.  Nose: Rhinorrhea present.  Mouth/Throat: Uvula is midline and mucous membranes are normal. Posterior oropharyngeal erythema present. No oropharyngeal exudate or posterior oropharyngeal edema.  Eyes: Conjunctivae normal and EOM are normal. Pupils are equal, round, and reactive to light.  Neck: Normal range of motion.  Neck supple.  Cardiovascular: Normal rate.   Pulmonary/Chest: Effort normal.  Abdominal: Soft.  Musculoskeletal: Normal range of motion. She exhibits no edema and no tenderness.  Neurological: She is alert and oriented to person, place, and time. She has normal reflexes.  Skin: Skin is warm and dry.  Psychiatric: She has a normal mood and affect.    ED Course  Procedures (including critical care time)  Labs Reviewed - No data to display No results found.   1. URI (upper respiratory infection)   2. Cough       MDM  Upper respiratory infection with cough > 7days.  rx for azithromycin.  Continue albuterol and over the counter medication.  Follow up with pcp from list this week.  She understands to return for worsening symptoms.  Ready for discharge.        Remi Haggard, NP 06/27/12 1037

## 2012-06-27 NOTE — ED Provider Notes (Signed)
Medical screening examination/treatment/procedure(s) were performed by non-physician practitioner and as supervising physician I was immediately available for consultation/collaboration.  Kearston Putman, MD 06/27/12 1807 

## 2012-07-09 ENCOUNTER — Encounter (HOSPITAL_COMMUNITY): Payer: Self-pay | Admitting: Emergency Medicine

## 2012-07-09 ENCOUNTER — Emergency Department (HOSPITAL_COMMUNITY)
Admission: EM | Admit: 2012-07-09 | Discharge: 2012-07-09 | Disposition: A | Discharge status: Home / Self Care | Payer: Self-pay | Attending: Emergency Medicine | Admitting: Emergency Medicine

## 2012-07-09 DIAGNOSIS — R0602 Shortness of breath: Secondary | ICD-10-CM | POA: Insufficient documentation

## 2012-07-09 DIAGNOSIS — Z79899 Other long term (current) drug therapy: Secondary | ICD-10-CM | POA: Insufficient documentation

## 2012-07-09 DIAGNOSIS — R059 Cough, unspecified: Secondary | ICD-10-CM | POA: Insufficient documentation

## 2012-07-09 DIAGNOSIS — R05 Cough: Secondary | ICD-10-CM | POA: Insufficient documentation

## 2012-07-09 DIAGNOSIS — R0789 Other chest pain: Secondary | ICD-10-CM | POA: Insufficient documentation

## 2012-07-09 DIAGNOSIS — J45901 Unspecified asthma with (acute) exacerbation: Secondary | ICD-10-CM | POA: Insufficient documentation

## 2012-07-09 MED ORDER — ALBUTEROL SULFATE (5 MG/ML) 0.5% IN NEBU
INHALATION_SOLUTION | RESPIRATORY_TRACT | Status: AC
  Administered 2012-07-09: 2.5 mg via RESPIRATORY_TRACT
  Filled 2012-07-09: qty 1

## 2012-07-09 MED ORDER — IPRATROPIUM BROMIDE 0.02 % IN SOLN
0.5000 mg | Freq: Once | RESPIRATORY_TRACT | Status: AC
  Administered 2012-07-09: 0.5 mg via RESPIRATORY_TRACT

## 2012-07-09 MED ORDER — PREDNISONE 20 MG PO TABS
60.0000 mg | ORAL_TABLET | Freq: Once | ORAL | Status: AC
  Administered 2012-07-09: 60 mg via ORAL

## 2012-07-09 MED ORDER — PREDNISONE 20 MG PO TABS
ORAL_TABLET | ORAL | Status: AC
  Administered 2012-07-09: 60 mg via ORAL
  Filled 2012-07-09: qty 3

## 2012-07-09 MED ORDER — ALBUTEROL SULFATE (5 MG/ML) 0.5% IN NEBU
2.5000 mg | INHALATION_SOLUTION | Freq: Once | RESPIRATORY_TRACT | Status: AC
  Administered 2012-07-09: 2.5 mg via RESPIRATORY_TRACT

## 2012-07-09 MED ORDER — IPRATROPIUM BROMIDE 0.02 % IN SOLN
RESPIRATORY_TRACT | Status: AC
  Administered 2012-07-09: 0.5 mg via RESPIRATORY_TRACT
  Filled 2012-07-09: qty 2.5

## 2012-07-09 MED ORDER — ALBUTEROL SULFATE (5 MG/ML) 0.5% IN NEBU
5.0000 mg | INHALATION_SOLUTION | Freq: Once | RESPIRATORY_TRACT | Status: AC
  Administered 2012-07-09: 5 mg via RESPIRATORY_TRACT
  Filled 2012-07-09: qty 1

## 2012-07-09 MED ORDER — IPRATROPIUM BROMIDE 0.02 % IN SOLN: 0.5000 mg | Freq: Once | RESPIRATORY_TRACT | Status: DC

## 2012-07-09 MED ORDER — PREDNISONE 20 MG PO TABS: 60.0000 mg | ORAL_TABLET | Freq: Every day | ORAL | Status: DC

## 2012-07-09 MED ORDER — ALBUTEROL SULFATE HFA 108 (90 BASE) MCG/ACT IN AERS
2.0000 | INHALATION_SPRAY | RESPIRATORY_TRACT | Status: DC | PRN
  Administered 2012-07-09: 2 via RESPIRATORY_TRACT
  Filled 2012-07-09: qty 6.7

## 2012-07-09 NOTE — ED Notes (Signed)
Pt states she feels better after the 2nd breathing tx

## 2012-07-09 NOTE — ED Provider Notes (Signed)
Medical screening examination/treatment/procedure(s) were performed by non-physician practitioner and as supervising physician I was immediately available for consultation/collaboration.   Cordell Guercio, MD 07/09/12 2241 

## 2012-07-09 NOTE — ED Provider Notes (Signed)
History    This chart was scribed for non-physician practitioner working with Dione Booze, MD by Melba Coon, ED Scribe. This patient was seen in room WTR6/WTR6 and the patient's care was started at 6:49PM.  CSN: [id number]  Arrival date & time 07/09/12  1742   First MD Initiated Contact with Patient 07/09/12 1753      Chief Complaint  Patient presents with  . Asthma  . Cough    (Consider location/radiation/quality/duration/timing/severity/associated sxs/prior treatment) The history is provided by the patient. No language interpreter was used.   Julie Cardenas is a 27 y.o. female who presents to the Emergency Department complaining of persistent, moderate shortness of breath with associated productive cough an onset 3-4 days ago. She reports she has a chronic history of asthma but she has run out of albuterol inhaler 2 days ago. She reports she has also been treated for a URI with an onset 2 weeks ago and has been taking Zithromax. She reports that her symptoms have gotten worse since onset. Phlegm expelled by cough is thick and dark.  She has never been hospitalized or intubated for her asthma. She reports audible wheezing. She denies dysarthria and is able to complete sentences with little difficulty. She reports that prednisone and breathing treatment given before exam has alleviated her symptoms, but she still reports some chest tightness and would like another treatment. Denies HA, fever,chills, neck pain, sore throat, rash, back pain, abdominal pain, nausea, emesis, diarrhea, dysuria, or extremity pain, edema, weakness, numbness, or tingling. No known allergies. No other pertinent medical symptoms.   Past Medical History  Diagnosis Date  . Seasonal allergies   . Asthma   . Bronchitis     History reviewed. No pertinent past surgical history.  Family History  Problem Relation Age of Onset  . Diabetes Mother   . Cancer Mother   . Hypertension Mother     History  Substance Use  Topics  . Smoking status: Never Smoker   . Smokeless tobacco: Not on file  . Alcohol Use: Yes    OB History   Grav Para Term Preterm Abortions TAB SAB Ect Mult Living                  Review of Systems  Constitutional: Negative for fever.  Respiratory: Positive for cough, shortness of breath and wheezing.    10 Systems reviewed and all are negative for acute change except as noted in the HPI.   Allergies  Review of patient's allergies indicates no known allergies.  Home Medications   Current Outpatient Rx  Name  Route  Sig  Dispense  Refill  . albuterol (PROVENTIL HFA;VENTOLIN HFA) 108 (90 BASE) MCG/ACT inhaler   Inhalation   Inhale 1-2 puffs into the lungs every 6 (six) hours as needed for wheezing.   1 Inhaler   0   . diphenhydrAMINE (BENADRYL) 12.5 MG/5ML liquid   Oral   Take 25 mg by mouth 4 (four) times daily as needed for allergies.         Marland Kitchen loratadine (CLARITIN) 10 MG tablet   Oral   Take 10 mg by mouth every morning.           BP 142/73  Pulse 108  Temp(Src) 98.3 F (36.8 C) (Oral)  SpO2 100%  LMP 05/31/2012  Physical Exam  Nursing note and vitals reviewed. Constitutional: She is oriented to person, place, and time.  Awake, alert, nontoxic appearance.  HENT:  Head: Atraumatic.  Right  Ear: External ear normal.  Left Ear: External ear normal.  Mouth/Throat: Oropharynx is clear and moist.  Nasal congestion with some mucus present. Bilateral TMs normal.  Eyes: EOM are normal. Pupils are equal, round, and reactive to light. Right eye exhibits no discharge. Left eye exhibits no discharge.  Neck: Normal range of motion. Neck supple.  Cardiovascular: Normal rate, regular rhythm and normal heart sounds.   No murmur heard. Pulmonary/Chest: Effort normal. No respiratory distress. She has wheezes. She has no rales. She exhibits no tenderness.  Mild wheezing at the bases of bilateral lungs.  Abdominal: Soft. There is no tenderness. There is no rebound.   Musculoskeletal: She exhibits no tenderness.  Baseline ROM, no obvious new focal weakness.  Neurological: She is alert and oriented to person, place, and time. No cranial nerve deficit.  Mental status and motor strength appears baseline for patient and situation.  Skin: No rash noted.  Psychiatric: She has a normal mood and affect. Her behavior is normal.    ED Course  Procedures (including critical care time)  DIAGNOSTIC STUDIES: Oxygen Saturation is 100% on room air, normal by my interpretation.    COORDINATION OF CARE:  6:54PM - second breathing treatment and prednisone will be ordered for [employer].  8:11 PM Recheck: Lungs clear to ascultation.  Will prescribe albuterol inhaler.  Pt understands and agrees.     Labs Reviewed - No data to display No results found.   No diagnosis found.    MDM  Patient with a history of Asthma presents today with an asthma exacerbation.  No current signs of respiratory distress.  Pulse ox 100 on RA.  Lung exam improved after hospital treatment. Pt states they are breathing at baseline. Pt has been instructed to continue using prescribed medications and to speak with PCP about today's exacerbation.  Return precautions discussed with the patient.  I personally performed the services described in this documentation, which was scribed in my presence. The recorded information has been reviewed and is accurate.        Pascal Lux Leighton, PA-C 07/09/12 2021

## 2012-07-09 NOTE — ED Notes (Signed)
Pt c/o difficulty breathing for the last 2 days. Worse now. Pt states she is out of her inhaler. Pt reports she has been treated for URI 2 weeks ago and took Z-Pack for same. Pt now reports productive cough with thick dark sputum. Pt has audible expiratory wheezes. Pt with no acute distress. Speaks in complete sentences. Pt with steady gait to exam room.

## 2012-09-16 ENCOUNTER — Emergency Department (HOSPITAL_COMMUNITY)
Admission: EM | Admit: 2012-09-16 | Discharge: 2012-09-16 | Disposition: A | Discharge status: Home / Self Care | Payer: Self-pay | Attending: Emergency Medicine | Admitting: Emergency Medicine

## 2012-09-16 ENCOUNTER — Encounter (HOSPITAL_COMMUNITY): Payer: Self-pay | Admitting: Emergency Medicine

## 2012-09-16 DIAGNOSIS — J45901 Unspecified asthma with (acute) exacerbation: Secondary | ICD-10-CM | POA: Insufficient documentation

## 2012-09-16 DIAGNOSIS — Z8709 Personal history of other diseases of the respiratory system: Secondary | ICD-10-CM | POA: Insufficient documentation

## 2012-09-16 MED ORDER — ALBUTEROL SULFATE HFA 108 (90 BASE) MCG/ACT IN AERS
2.0000 | INHALATION_SPRAY | Freq: Once | RESPIRATORY_TRACT | Status: DC
  Filled 2012-09-16: qty 6.7

## 2012-09-16 MED ORDER — DEXAMETHASONE SODIUM PHOSPHATE 10 MG/ML IJ SOLN
16.0000 mg | Freq: Once | INTRAMUSCULAR | Status: AC
  Administered 2012-09-16: 16 mg
  Filled 2012-09-16: qty 2

## 2012-09-16 MED ORDER — ALBUTEROL SULFATE HFA 108 (90 BASE) MCG/ACT IN AERS: 2.0000 | INHALATION_SPRAY | RESPIRATORY_TRACT | Status: DC | PRN

## 2012-09-16 MED ORDER — ALBUTEROL SULFATE (5 MG/ML) 0.5% IN NEBU
5.0000 mg | INHALATION_SOLUTION | Freq: Once | RESPIRATORY_TRACT | Status: AC
  Administered 2012-09-16: 5 mg via RESPIRATORY_TRACT
  Filled 2012-09-16: qty 1

## 2012-09-16 MED ORDER — IPRATROPIUM BROMIDE 0.02 % IN SOLN
0.5000 mg | Freq: Once | RESPIRATORY_TRACT | Status: AC
  Administered 2012-09-16: 0.5 mg via RESPIRATORY_TRACT
  Filled 2012-09-16: qty 2.5

## 2012-09-16 MED ORDER — DEXAMETHASONE 4 MG PO TABS: 16.0000 mg | ORAL_TABLET | Freq: Once | ORAL | Status: DC

## 2012-09-16 NOTE — ED Notes (Signed)
Per patient, has history of asthma-increased symptoms on and off for a week-worse today-no relief from inhalers

## 2012-09-16 NOTE — ED Notes (Signed)
Pt alert and oriented x4. Respirations even and labored, bilateral symmetrical rise and fall of chest. Skin warm and dry. In no acute distress. Denies needs.

## 2012-09-16 NOTE — ED Notes (Signed)
md at bedside

## 2012-09-19 NOTE — ED Provider Notes (Signed)
History    26yf with sob.intermittent. Onset about a week ago. Hx of asthma and says feels different. Gets some relief with inhaler but symptoms return. No fever or chills. No cp. No unusual leg pain or swelling. No hx of blood clot, exogenous estrogen, recent procedures.   CSN: [id number]  Arrival date & time 09/16/12  1332   First MD Initiated Contact with Patient 09/16/12 1406      Chief Complaint  Patient presents with  . Asthma    (Consider location/radiation/quality/duration/timing/severity/associated sxs/prior treatment) HPI  Past Medical History  Diagnosis Date  . Seasonal allergies   . Asthma   . Bronchitis     History reviewed. No pertinent past surgical history.  Family History  Problem Relation Age of Onset  . Diabetes Mother   . Cancer Mother   . Hypertension Mother     History  Substance Use Topics  . Smoking status: Never Smoker   . Smokeless tobacco: Not on file  . Alcohol Use: Yes    OB History   Grav Para Term Preterm Abortions TAB SAB Ect Mult Living                  Review of Systems  All systems reviewed and negative, other than as noted in HPI.   Allergies  Review of patient's allergies indicates no known allergies.  Home Medications   Current Outpatient Rx  Name  Route  Sig  Dispense  Refill  . albuterol (PROVENTIL HFA;VENTOLIN HFA) 108 (90 BASE) MCG/ACT inhaler   Inhalation   Inhale 2 puffs into the lungs every 6 (six) hours as needed for wheezing or shortness of breath.         . cetirizine (ZYRTEC) 10 MG tablet   Oral   Take 10 mg by mouth daily.         Marland Kitchen albuterol (PROVENTIL HFA;VENTOLIN HFA) 108 (90 BASE) MCG/ACT inhaler   Inhalation   Inhale 2 puffs into the lungs every 4 (four) hours as needed for wheezing.   1 Inhaler   2   . dexamethasone (DECADRON) 4 MG tablet   Oral   Take 4 tablets (16 mg total) by mouth once.   4 tablet   0     BP 112/76  Pulse 101  Temp(Src) 98.1 F (36.7 C) (Oral)  Resp 15   SpO2 99%  LMP 09/09/2012  Physical Exam  Nursing note and vitals reviewed. Constitutional: She appears well-developed and well-nourished. No distress.  HENT:  Head: Normocephalic and atraumatic.  Eyes: Conjunctivae are normal. Right eye exhibits no discharge. Left eye exhibits no discharge.  Neck: Neck supple.  Cardiovascular: Normal rate, regular rhythm and normal heart sounds.  Exam reveals no gallop and no friction rub.   No murmur heard. Pulmonary/Chest: Effort normal. No respiratory distress. She has wheezes.  Faint expiratory wheezing b/l. No increased wob.   Abdominal: Soft. She exhibits no distension. There is no tenderness.  Musculoskeletal: She exhibits no edema and no tenderness.  Lower extremities symmetric as compared to each other. No calf tenderness. Negative Homan's. No palpable cords.   Neurological: She is alert.  Skin: Skin is warm and dry.  Psychiatric: She has a normal mood and affect. Her behavior is normal. Thought content normal.    ED Course  Procedures (including critical care time)  Labs Reviewed - No data to display No results found.   1. Mild asthma exacerbation       MDM  26yf with symptoms  consistent with mild asthma exacerbation. Doubt PE, pneumothorax, infectious or other emergent process. Tachycardia noted, but pt used albuterol just prior to arrival and I feel this is most likely reason for it. I feel appropriate for outpt tx. Return precautions discussed.         Raeford Razor, MD 09/19/12 1341

## 2012-10-04 ENCOUNTER — Encounter (HOSPITAL_COMMUNITY): Payer: Self-pay | Admitting: *Deleted

## 2012-10-04 ENCOUNTER — Emergency Department (HOSPITAL_COMMUNITY)
Admission: EM | Admit: 2012-10-04 | Discharge: 2012-10-04 | Disposition: A | Discharge status: Home / Self Care | Payer: Self-pay | Attending: Emergency Medicine | Admitting: Emergency Medicine

## 2012-10-04 ENCOUNTER — Emergency Department (HOSPITAL_COMMUNITY): Payer: Self-pay

## 2012-10-04 DIAGNOSIS — Z79899 Other long term (current) drug therapy: Secondary | ICD-10-CM | POA: Insufficient documentation

## 2012-10-04 DIAGNOSIS — J3489 Other specified disorders of nose and nasal sinuses: Secondary | ICD-10-CM | POA: Insufficient documentation

## 2012-10-04 DIAGNOSIS — J302 Other seasonal allergic rhinitis: Secondary | ICD-10-CM

## 2012-10-04 DIAGNOSIS — J45901 Unspecified asthma with (acute) exacerbation: Secondary | ICD-10-CM | POA: Insufficient documentation

## 2012-10-04 MED ORDER — CETIRIZINE HCL 10 MG PO TABS: 10.0000 mg | ORAL_TABLET | Freq: Every day | ORAL | Status: DC

## 2012-10-04 MED ORDER — ALBUTEROL SULFATE HFA 108 (90 BASE) MCG/ACT IN AERS: 1.0000 | INHALATION_SPRAY | Freq: Four times a day (QID) | RESPIRATORY_TRACT | Status: DC | PRN

## 2012-10-04 MED ORDER — ALBUTEROL SULFATE (5 MG/ML) 0.5% IN NEBU
2.5000 mg | INHALATION_SOLUTION | Freq: Once | RESPIRATORY_TRACT | Status: AC
  Administered 2012-10-04: 2.5 mg via RESPIRATORY_TRACT
  Filled 2012-10-04: qty 0.5

## 2012-10-04 MED ORDER — ALBUTEROL SULFATE HFA 108 (90 BASE) MCG/ACT IN AERS
2.0000 | INHALATION_SPRAY | Freq: Once | RESPIRATORY_TRACT | Status: DC
  Filled 2012-10-04 (×2): qty 6.7

## 2012-10-04 NOTE — ED Provider Notes (Signed)
History     CSN: [id number]  Arrival date & time 10/04/12  5621   First MD Initiated Contact with Patient 10/04/12 0913      Chief Complaint  Patient presents with  . Asthma    (Consider location/radiation/quality/duration/timing/severity/associated sxs/prior treatment) HPI Comments: Patient is a 27 year old female with a past medical history of asthma who presents with "asthma problems" for the past 2 weeks. Symptoms started gradually and progressively worsened since the onset. Patient reports running out of her inhaler a few weeks ago. No aggravating/alleviating factors. Patient reports associated congestion.    Past Medical History  Diagnosis Date  . Seasonal allergies   . Asthma   . Bronchitis     History reviewed. No pertinent past surgical history.  Family History  Problem Relation Age of Onset  . Diabetes Mother   . Cancer Mother   . Hypertension Mother     History  Substance Use Topics  . Smoking status: Never Smoker   . Smokeless tobacco: Not on file  . Alcohol Use: Yes    OB History   Grav Para Term Preterm Abortions TAB SAB Ect Mult Living                  Review of Systems  HENT: Positive for congestion.   Respiratory: Positive for wheezing.   All other systems reviewed and are negative.    Allergies  Review of patient's allergies indicates no known allergies.  Home Medications   Current Outpatient Rx  Name  Route  Sig  Dispense  Refill  . albuterol (PROVENTIL HFA;VENTOLIN HFA) 108 (90 BASE) MCG/ACT inhaler   Inhalation   Inhale 2 puffs into the lungs every 6 (six) hours as needed for wheezing or shortness of breath.         . cetirizine (ZYRTEC) 10 MG tablet   Oral   Take 10 mg by mouth daily.         . Pseudoephedrine-APAP-DM (TYLENOL COLD/FLU SEVERE DAY PO)   Oral   Take 30 mLs by mouth daily as needed (for congestion).           BP 109/56  Pulse 106  Temp(Src) 98 F (36.7 C) (Oral)  Resp 24  SpO2 99%  LMP  09/09/2012  Physical Exam  Nursing note and vitals reviewed. Constitutional: She is oriented to person, place, and time. She appears well-developed and well-nourished. No distress.  HENT:  Head: Normocephalic and atraumatic.  Eyes: Conjunctivae are normal.  Neck: Normal range of motion.  Cardiovascular: Normal rate and regular rhythm.  Exam reveals no gallop and no friction rub.   No murmur heard. Pulmonary/Chest: Effort normal. She has wheezes. She has no rales. She exhibits no tenderness.  Wheezing noted throughout bilateral lung fields.   Abdominal: Soft. There is no tenderness.  Musculoskeletal: Normal range of motion.  Neurological: She is alert and oriented to person, place, and time. Coordination normal.  Speech is goal-oriented. Moves limbs without ataxia.   Skin: Skin is warm and dry.  Psychiatric: She has a normal mood and affect. Her behavior is normal.    ED Course  Procedures (including critical care time)  Labs Reviewed - No data to display Dg Chest 2 View  10/04/2012  *RADIOLOGY REPORT*  Clinical Data: Asthma, cough, shortness of breath  CHEST - 2 VIEW  Comparison:  01/02/2012  Findings:  The heart size and mediastinal contours are within normal limits.  Both lungs are clear.  The visualized skeletal  structures are unremarkable.  IMPRESSION: No active cardiopulmonary disease.   Original Report Authenticated By: Judie Petit. Shick, M.D.      1. Asthma exacerbation   2. Seasonal allergies       MDM  10:24 AM Patient feeling better after albuterol nebulizer. Chest xray unremarkable. Patient given an albuterol inhaler. Patient is afebrile with stable vitals. Patient instructed to return with worsening or concerning symptoms.         Emilia Beck, PA-C 10/04/12 1549

## 2012-10-04 NOTE — ED Notes (Signed)
RT in PEDS ED with pt at time of order.  RT in to give neb and MDI.  Already given by RN.

## 2012-10-04 NOTE — Discharge Instructions (Signed)
Use inhaler as needed for asthma. Take Zyrtec daily for allergies. Refer to attached documents for more information. Return to the ED with worsening or concerning symptoms. Follow up with a doctor from the list below.   RESOURCE GUIDE  Chronic Pain Problems: Contact [facility]  [phone] Patients need to be referred by their primary care doctor.  Insufficient Money for Medicine: Contact United Way:  call "211."   No Primary Care Doctor: - Call Health Connect  [phone] - can help you locate a primary care doctor that  accepts your insurance, provides certain services, etc. - Physician Referral Service- [phone]  Agencies that provide inexpensive medical care: - [facility]  [phone] - [facility]  [phone] - [facility]  [phone] - Women's Clinic  [phone] - Planned Parenthood  [phone] [facility]  [phone]  Medicaid-accepting [facility] Providers: - [facility]- [address]  [phone], Mon-Fri 9am-7pm, Sat 9am-1pm - [facility]- [address] [place], Suite [place]  [phone] - [facility]- [address], Suite [place]  [phone] [facility]- [address]  [phone] - Renaye Rakers- [address] [place], [address], [phone]  Only accepts [place] patients after they have their name  applied to their card  Self Pay (no insurance) in [place]: - Sickle Cell Patients: Dr Willey Blade, [facility]  [address] [place], [phone] - [facility]- [address] [place]  [phone]       [facility] [place]- [address]       -     [facility]- see information above (Speak to [employer] if you do not have insurance)       -  [facility]- [address],  [phone]       -  [facility]- [address] [phone]       -  Dr Julio Sicks-  [address], [place], [phone]       -  [facility]gent [facility] and [facility] - [address], [phone]       -  [facility]- [address] [phone], also [address] [phone]       -    [facility]- [address] [place], [phone], 1st & 3rd Saturday        every month, 10am-1pm  [facility] [address] [place], [place] [address] [phone]  [facility] [address] [place], [place] [address] [phone]  1) Find a Doctor and Pay Out of Pocket Although you won't have to find out who is covered by your insurance plan, it is a good idea to ask around and get recommendations. You will then need to call the office and see if the doctor you have chosen will accept you as a new patient and what types of options they offer for patients who are self-pay. Some doctors offer discounts or will set up payment plans for their patients who do not have insurance, but you will need to ask so you aren't surprised when you get to your appointment.  2) Contact Your Local Health Department Not all health departments have doctors that can see patients for sick  visits, but many do, so it is worth a call to see if yours does. If you don't know where your local health department is, you can check in your phone book. The CDC also has a tool to help you locate your state's health department, and many state websites also have listings of all of their local health departments.  3) Find a Walk-in Clinic If your illness is not likely to be very severe or complicated, you may want to try a walk in clinic. These are popping up all over the country in pharmacies, drugstores, and shopping centers. They're usually staffed by nurse practitioners or physician assistants that have been trained to treat common illnesses and complaints. They're usually fairly quick and inexpensive.  However, if you have serious medical issues or chronic medical problems, these are probably not your best option  STD Testing - [facility] Department of [facility], STD Clinic, [address] [place] [address] [place], phone [phone] or [phone].  Monday - Friday, call for an appointment. [facility] of [employer], STD Clinic, [place] [address], [place], phone [phone] or [phone].  Monday - Friday, call for an appointment.  Abuse/Neglect: [facility] Child Abuse Hotline 4230647722 [facility] Child Abuse Hotline [phone] (After Hours)  Emergency Shelter:  [facility] [phone]  Maternity Homes: - Room at the [place] [facility] [phone] - [facility] [phone]  MRSA Hotline #:   504-093-8970  Dental Assistance If unable to pay or uninsured, contact:  [facility]. to become qualified for the adult dental clinic.  Patients with Medicaid: [facility] [phone] [address] Qu[address], [phone] [address] [phone]  If unable to pay, or uninsured, contact [facility] [phone] in [place], [phone] in [facility]) to become qualified for the adult dental clinic  [facility] [address] [place], [place] [address] [phone] [email or web address]  Other Proofreader Services: - [facility]- [address] [place], [place], [place], [address], [phone], Ext. [phone], 2nd and 4th Thursday of the month at 6:30am.  10 clients each day by appointment, can sometimes see walk-in patients if someone does not show for an appointment. [facility]- [address] [place], [place], [address], [phone] - [facility] [address], [place], [place], [address], [phone] - [facility]- [phone] [facility] Health Department- [phone] [facility] Health [phone]       Behavioral Health Resources in the [facility]  Intensive Outpatient Programs: [facility]      [address] [place], [place] [phone] Both a day and evening program       [facility] Outpatient     [address]        [place], [place] [address] [phone]         ADS: [facility] [address] [place] [phone]  [facility] Mental Health ACCESS LINE: 7192712325 or [phone] [place], [place] [address] [email or web address]   Substance Abuse Resources: - [facility] Drug [facility]  (703) [phone] - [facility] [phone] - [facility] [place] [phone] Floydene Flock [phone] - Residential & Outpatient Substance Abuse Program  [phone]  Psychological Services: [facility]  [phone] [facility] Services  [phone] - [facility], [phone] [place]. [address] [place], ACCESS  LINE: 919-022-9433 or [phone], [email or web address]  Mobile Crisis Teams:                                        Therapeutic Alternatives         Mobile Crisis Care Unit 4343260040             [facility] [address] Ginette Otto [phone]                                         Interventionist [address] [place] [phone]  Self-Help/Support Groups: Mental Health Assoc. of [employer] of support groups 9782210471 (call for more info)   Narcotics Anonymous (NA) Caring Services [address] [place] - 2 meetings at this location  Residential Treatment Programs:  [facility]      [address]        [place]       [phone]         [facility] [address] [place] [address] [place], [place]  [address] [phone]  [facility]  [address] [place], [place] [address] [phone] Admissions: 8am-3pm M-F  [facility]     [address]        [place], [place] [address]       [phone]         [facility] [address] [place], [place] [phone]  [facility] [address] [place], [place] [phone]  [facility] - Intensive Outpatient      [address]     [place], [place]       [phone]         [facility] ([facility]     [address] [place], [place] [phone] or [phone]  Residential Treatment Services (RTS), Medicaid [address] [place], [place] [phone]  [facility] [address]                                               [address] [place] [phone]  [facility] Resources: [phone]               General Therapy                                                Angie Fava, PhD        [address] [place], [place] [address]         [phone]   Insurance  [facility] Behavioral   [address] [place], [place] [address] [phone]  [facility] [address] [place], [place] [address] [phone] Insurance/Medicaid/sponsorship through [facility]  [facility] Families  [address]                                        [place], [place] [address]    Therapy/tele-psych/case         [phone]          [facility] [address], [place]  [address]  Adolescent/group home/case management [phone]                                           Creola Corn PhD       General therapy       Insurance   [phone]         Dr. Lolly Mustache, [place], M-F [phone]  [facility]  United [employer] [facility] Dept. [address]                 [address]         [address] [place] [address]  Blondell Reveal Phone:  [phone]                                  Phone:  [phone]                   Phone:  [phone]  [facility], [phone] - [facility] - [facility]- [phone]       -     [facility] in [place], [address],             [phone], Insurance  [place] Child Abuse Hotline (405) 558-6947 or [phone] (After Hours)

## 2012-10-04 NOTE — ED Notes (Signed)
Reports not feeling well x 2 weeks, having problems with allergies and asthma, is out of her inhaler. Airway intact at triage, able to speak in full sentences.

## 2012-10-08 NOTE — ED Provider Notes (Signed)
Medical screening examination/treatment/procedure(s) were conducted as a shared visit with non-physician practitioner(s) and myself.  I personally evaluated the patient during the encounter.  Wheezing improved after breathing treatment. Discharge home with albuterol inhaler  Donnetta Hutching, MD 10/08/12 [phone]

## 2013-02-09 ENCOUNTER — Encounter (HOSPITAL_COMMUNITY): Payer: Self-pay | Admitting: *Deleted

## 2013-02-09 ENCOUNTER — Emergency Department (HOSPITAL_COMMUNITY)
Admission: EM | Admit: 2013-02-09 | Discharge: 2013-02-09 | Disposition: A | Discharge status: Home / Self Care | Payer: Self-pay | Attending: Emergency Medicine | Admitting: Emergency Medicine

## 2013-02-09 DIAGNOSIS — R51 Headache: Secondary | ICD-10-CM | POA: Insufficient documentation

## 2013-02-09 DIAGNOSIS — R0981 Nasal congestion: Secondary | ICD-10-CM

## 2013-02-09 DIAGNOSIS — J069 Acute upper respiratory infection, unspecified: Secondary | ICD-10-CM | POA: Insufficient documentation

## 2013-02-09 DIAGNOSIS — R059 Cough, unspecified: Secondary | ICD-10-CM | POA: Insufficient documentation

## 2013-02-09 DIAGNOSIS — Z79899 Other long term (current) drug therapy: Secondary | ICD-10-CM | POA: Insufficient documentation

## 2013-02-09 DIAGNOSIS — R0602 Shortness of breath: Secondary | ICD-10-CM | POA: Insufficient documentation

## 2013-02-09 DIAGNOSIS — R0789 Other chest pain: Secondary | ICD-10-CM | POA: Insufficient documentation

## 2013-02-09 DIAGNOSIS — J45901 Unspecified asthma with (acute) exacerbation: Secondary | ICD-10-CM | POA: Insufficient documentation

## 2013-02-09 DIAGNOSIS — R05 Cough: Secondary | ICD-10-CM | POA: Insufficient documentation

## 2013-02-09 DIAGNOSIS — R000 Tachycardia, unspecified: Secondary | ICD-10-CM | POA: Insufficient documentation

## 2013-02-09 MED ORDER — IPRATROPIUM BROMIDE HFA 17 MCG/ACT IN AERS
2.0000 | INHALATION_SPRAY | Freq: Once | RESPIRATORY_TRACT | Status: AC
  Administered 2013-02-09: 2 via RESPIRATORY_TRACT
  Filled 2013-02-09: qty 12.9

## 2013-02-09 MED ORDER — MOMETASONE FUROATE 50 MCG/ACT NA SUSP: 2.0000 | Freq: Every day | NASAL | Status: DC

## 2013-02-09 MED ORDER — FEXOFENADINE-PSEUDOEPHED ER 180-240 MG PO TB24: 1.0000 | ORAL_TABLET | Freq: Every day | ORAL | Status: DC

## 2013-02-09 NOTE — ED Provider Notes (Signed)
CSN: [id number]     Arrival date & time 02/09/13  6578 History   First MD Initiated Contact with Patient 02/09/13 0735     Chief Complaint  Patient presents with  . Chest Pain  . Shortness of Breath  . Nasal Congestion   (Consider location/radiation/quality/duration/timing/severity/associated sxs/prior Treatment) HPI Patient is a 27 year old female who presents to emergency department with complaint of nasal congestion and cough. Patient states her symptoms began about 2 weeks ago worse in the last few days. Patient states she has history of asthma and feels like it is her asthma acting up. States she's having a nonproductive cough with some shortness of breath. The patient has been taking her albuterol inhaler but ran out this morning. Patient also states she has chronic sinus congestion and has been having worsening nasal discharge in the last couple days. Patient states she takes Nasonex which she ran out of and also uses Afrin daily. Patient denies any fever, chills. She denies any other complaints. Nothing making her symptoms better or worse.   Past Medical History  Diagnosis Date  . Seasonal allergies   . Asthma   . Bronchitis    History reviewed. No pertinent past surgical history. Family History  Problem Relation Age of Onset  . Diabetes Mother   . Cancer Mother   . Hypertension Mother    History  Substance Use Topics  . Smoking status: Never Smoker   . Smokeless tobacco: Not on file  . Alcohol Use: Yes   OB History   Grav Para Term Preterm Abortions TAB SAB Ect Mult Living                 Review of Systems  Constitutional: Negative for fever and chills.  HENT: Positive for congestion and sinus pressure. Negative for sore throat, neck pain and neck stiffness.   Respiratory: Positive for cough, chest tightness, shortness of breath and wheezing.   Cardiovascular: Negative.   Gastrointestinal: Negative.   Genitourinary: Negative for dysuria.  Musculoskeletal:  Negative.   Skin: Negative for rash.  Neurological: Positive for headaches.  All other systems reviewed and are negative.    Allergies  Review of patient's allergies indicates no known allergies.  Home Medications   Current Outpatient Rx  Name  Route  Sig  Dispense  Refill  . albuterol (PROVENTIL HFA;VENTOLIN HFA) 108 (90 BASE) MCG/ACT inhaler   Inhalation   Inhale 2 puffs into the lungs every 6 (six) hours as needed for wheezing or shortness of breath.         Marland Kitchen albuterol (PROVENTIL HFA;VENTOLIN HFA) 108 (90 BASE) MCG/ACT inhaler   Inhalation   Inhale 1-2 puffs into the lungs every 6 (six) hours as needed for wheezing.   1 Inhaler   0   . cetirizine (ZYRTEC ALLERGY) 10 MG tablet   Oral   Take 1 tablet (10 mg total) by mouth daily.   30 tablet   1   . cetirizine (ZYRTEC) 10 MG tablet   Oral   Take 10 mg by mouth daily.         . Pseudoephedrine-APAP-DM (TYLENOL COLD/FLU SEVERE DAY PO)   Oral   Take 30 mLs by mouth daily as needed (for congestion).          BP 122/88  Pulse 125  Temp(Src) 97.5 F (36.4 C) (Oral)  Resp 23  SpO2 100% Physical Exam  Nursing note and vitals reviewed. Constitutional: She is oriented to person, place, and time. She appears  well-developed and well-nourished. No distress.  HENT:  Head: Normocephalic and atraumatic.  Right Ear: Tympanic membrane, external ear and ear canal normal.  Left Ear: External ear and ear canal normal.  Nose: Rhinorrhea present. Right sinus exhibits maxillary sinus tenderness and frontal sinus tenderness. Left sinus exhibits maxillary sinus tenderness and frontal sinus tenderness.  Mouth/Throat: Uvula is midline, oropharynx is clear and moist and mucous membranes are normal. No oropharyngeal exudate, posterior oropharyngeal edema or posterior oropharyngeal erythema.  Eyes: Conjunctivae are normal.  Neck: Neck supple.  Cardiovascular: Regular rhythm and normal heart sounds.  Tachycardia present.    Pulmonary/Chest: Effort normal and breath sounds normal. No respiratory distress. She has no wheezes. She has no rales.  Musculoskeletal: She exhibits no edema.  Neurological: She is alert and oriented to person, place, and time.  Skin: Skin is warm and dry.    ED Course  Procedures (including critical care time) Labs Review Labs Reviewed - No data to display Imaging Review No results found.  MDM   1. URI (upper respiratory infection)   2. Asthma exacerbation   3. Nasal congestion    Patient's with asthma exacerbation and chronic nasal congestion which is worsening. It is possible that her nasal congestion is worsening because she is using Afrin daily. Instructed to stop Afrin. Start nasal saline and will refill her Nasonex. Patient has no wheezing on exam today she is moving air well. I will refill her inhaler. Patient will need close follow primary care Dr. for recheck. Oxygen is 100% on room air. She is tachycardic however his sinus rhythm on the monitor. Pt is afebrile, doubt pneumonia.   Filed Vitals:   02/09/13 0746 02/09/13 0806  BP: 122/88 123/67  Pulse: 125 100  Temp: 97.5 F (36.4 C)   TempSrc: Oral   Resp: 23   SpO2: 100% 100%       Lottie Mussel, PA-C 02/09/13 1437

## 2013-02-09 NOTE — ED Notes (Signed)
Pt escorted to discharge window. Pt verbalized understanding discharge instructions. In no acute distress.  

## 2013-02-09 NOTE — ED Notes (Signed)
Pt has asthma and two weeks ago pt starting having shob and trouble catching her breath, symptoms have progressed.  Pt states that yesterday pt started having intermittent mid chest pain that radiates to left breast.  Pt also c/o nasal congestion.

## 2013-02-09 NOTE — ED Provider Notes (Signed)
Medical screening examination/treatment/procedure(s) were performed by non-physician practitioner and as supervising physician I was immediately available for consultation/collaboration.   Rolan Bucco, MD 02/09/13 [phone]

## 2013-02-09 NOTE — ED Notes (Signed)
Pt needs refill on albuterol inhaler and nasonex bc she ran out and only taking afrin.

## 2013-02-09 NOTE — ED Notes (Signed)
Triage assessment was done by Lyla Son RN but was charted in Epic under NT name.

## 2013-02-17 ENCOUNTER — Encounter (HOSPITAL_COMMUNITY): Payer: Self-pay | Admitting: Emergency Medicine

## 2013-02-17 ENCOUNTER — Emergency Department (HOSPITAL_COMMUNITY)
Admission: EM | Admit: 2013-02-17 | Discharge: 2013-02-17 | Disposition: A | Discharge status: Home / Self Care | Payer: Self-pay | Attending: Emergency Medicine | Admitting: Emergency Medicine

## 2013-02-17 DIAGNOSIS — Z79899 Other long term (current) drug therapy: Secondary | ICD-10-CM | POA: Insufficient documentation

## 2013-02-17 DIAGNOSIS — IMO0002 Reserved for concepts with insufficient information to code with codable children: Secondary | ICD-10-CM | POA: Insufficient documentation

## 2013-02-17 DIAGNOSIS — J45901 Unspecified asthma with (acute) exacerbation: Secondary | ICD-10-CM | POA: Insufficient documentation

## 2013-02-17 MED ORDER — ALBUTEROL SULFATE (5 MG/ML) 0.5% IN NEBU
5.0000 mg | INHALATION_SOLUTION | Freq: Once | RESPIRATORY_TRACT | Status: AC
  Administered 2013-02-17: 5 mg via RESPIRATORY_TRACT
  Filled 2013-02-17: qty 1

## 2013-02-17 MED ORDER — PREDNISONE 20 MG PO TABS: 60.0000 mg | ORAL_TABLET | Freq: Every day | ORAL | Status: DC

## 2013-02-17 MED ORDER — PREDNISONE 20 MG PO TABS
60.0000 mg | ORAL_TABLET | Freq: Once | ORAL | Status: AC
  Administered 2013-02-17: 60 mg via ORAL
  Filled 2013-02-17: qty 3

## 2013-02-17 MED ORDER — IPRATROPIUM BROMIDE 0.02 % IN SOLN
0.5000 mg | Freq: Once | RESPIRATORY_TRACT | Status: AC
  Administered 2013-02-17: 0.5 mg via RESPIRATORY_TRACT
  Filled 2013-02-17: qty 2.5

## 2013-02-17 MED ORDER — ALBUTEROL SULFATE HFA 108 (90 BASE) MCG/ACT IN AERS
2.0000 | INHALATION_SPRAY | RESPIRATORY_TRACT | Status: DC | PRN
  Administered 2013-02-17: 2 via RESPIRATORY_TRACT
  Filled 2013-02-17: qty 6.7

## 2013-02-17 NOTE — ED Provider Notes (Signed)
CSN: [id number]     Arrival date & time 02/17/13  1646 History   First MD Initiated Contact with Patient 02/17/13 1706     Chief Complaint  Patient presents with  . Asthma   (Consider location/radiation/quality/duration/timing/severity/associated sxs/prior Treatment) HPI Comments: Patient presents today with a chief complaint of cough, nasal congestion, SOB, and wheezing.  She reports that her symptoms have been present for the past week and are gradually worsening. She states that it feels similar to an asthma exacerbation that she has had in the past.  She was seen in the ED eight days ago for the same and was given a prescription for Atrovent.   She reports that she has been using the Atrovent,  but does not feel that it is helping.  She feels that the Albuterol inhaler worked better, but she ran out.  She denies fever, chills, or chest pain.  She currently does not smoke.    The history is provided by the patient.    Past Medical History  Diagnosis Date  . Seasonal allergies   . Asthma   . Bronchitis    History reviewed. No pertinent past surgical history. Family History  Problem Relation Age of Onset  . Diabetes Mother   . Cancer Mother   . Hypertension Mother    History  Substance Use Topics  . Smoking status: Never Smoker   . Smokeless tobacco: Not on file  . Alcohol Use: Yes   OB History   Grav Para Term Preterm Abortions TAB SAB Ect Mult Living                 Review of Systems  Constitutional: Negative for chills.  Respiratory: Positive for cough, shortness of breath and wheezing.   All other systems reviewed and are negative.    Allergies  Review of patient's allergies indicates no known allergies.  Home Medications   Current Outpatient Rx  Name  Route  Sig  Dispense  Refill  . fexofenadine-pseudoephedrine (ALLEGRA-D 24 HOUR) 180-240 MG per 24 hr tablet   Oral   Take 1 tablet by mouth daily.   30 tablet   0   . ipratropium (ATROVENT HFA) 17 MCG/ACT  inhaler   Inhalation   Inhale 2 puffs into the lungs every 6 (six) hours.         . mometasone (NASONEX) 50 MCG/ACT nasal spray   Nasal   Place 2 sprays into the nose daily.   17 g   12    BP 118/72  Pulse 117  Temp(Src) 97.8 F (36.6 C) (Oral)  Resp 20  SpO2 98%  LMP 01/28/2013 Physical Exam  Nursing note and vitals reviewed. Constitutional: She appears well-developed and well-nourished.  HENT:  Head: Normocephalic and atraumatic.  Right Ear: Tympanic membrane and ear canal normal.  Left Ear: Tympanic membrane and ear canal normal.  Nose: Mucosal edema and rhinorrhea present. Right sinus exhibits no maxillary sinus tenderness and no frontal sinus tenderness. Left sinus exhibits no maxillary sinus tenderness and no frontal sinus tenderness.  Mouth/Throat: Oropharynx is clear and moist.  Right nasal polyp  Cardiovascular: Normal rate, regular rhythm and normal heart sounds.   Pulmonary/Chest: Effort normal. She has wheezes.  Patient speaking in complete sentences Diffuse inspiratory wheezing  Neurological: She is alert.  Skin: Skin is warm and dry.  Psychiatric: She has a normal mood and affect.    ED Course  Procedures (including critical care time) Labs Review Labs Reviewed - No data  to display Imaging Review No results found.  6:26 PM Reassessed patient.  She reports that her breathing has improved somewhat. Patient continues to have mild diffuse wheezing.  Will order another breathing treatment and reassess. 7:14 PM Reassessed patient.    MDM  No diagnosis found. Patient ambulated in ED with O2 saturations maintained >90, no current signs of respiratory distress. Lung exam improved after nebulizer treatment. Prednisone given in the ED and pt will bd dc with 5 day burst. Pt states they are breathing at baseline. Pt has been instructed to continue using prescribed medications and to speak with PCP about today's exacerbation.     Pascal Lux [place],  PA-C 02/18/13 0210

## 2013-02-17 NOTE — ED Notes (Signed)
Pt from home reports asthma exacerbation x3 days. Pt states productive cough with yellow sputum, sinus congestion. Pt denies N/V/D or fever. Pt adds that she has used the inhaler that was rx'd to her from this facility, Atrovent, with no relief. Pt states that she" may have over used it today". Pt O2 sats 96% RA. Pt A&O and in NAD.

## 2013-02-20 NOTE — ED Provider Notes (Signed)
Medical screening examination/treatment/procedure(s) were performed by non-physician practitioner and as supervising physician I was immediately available for consultation/collaboration.  Gearl Baratta N Ambrosio Reuter, DO 02/20/13 0743 

## 2013-04-15 ENCOUNTER — Encounter (HOSPITAL_COMMUNITY): Payer: Self-pay | Admitting: Emergency Medicine

## 2013-04-15 ENCOUNTER — Emergency Department (HOSPITAL_COMMUNITY)
Admission: EM | Admit: 2013-04-15 | Discharge: 2013-04-15 | Disposition: A | Discharge status: Home / Self Care | Payer: Managed Care, Other (non HMO) | Attending: Emergency Medicine | Admitting: Emergency Medicine

## 2013-04-15 DIAGNOSIS — IMO0002 Reserved for concepts with insufficient information to code with codable children: Secondary | ICD-10-CM | POA: Insufficient documentation

## 2013-04-15 DIAGNOSIS — Z79899 Other long term (current) drug therapy: Secondary | ICD-10-CM | POA: Insufficient documentation

## 2013-04-15 DIAGNOSIS — J45901 Unspecified asthma with (acute) exacerbation: Secondary | ICD-10-CM | POA: Insufficient documentation

## 2013-04-15 MED ORDER — PREDNISONE 20 MG PO TABS: 60.0000 mg | ORAL_TABLET | Freq: Every day | ORAL | Status: DC

## 2013-04-15 MED ORDER — FLUTICASONE PROPIONATE 50 MCG/ACT NA SUSP
2.0000 | Freq: Every day | NASAL | Status: DC
  Administered 2013-04-15: 2 via NASAL
  Filled 2013-04-15: qty 16

## 2013-04-15 MED ORDER — IPRATROPIUM BROMIDE 0.02 % IN SOLN
0.5000 mg | Freq: Once | RESPIRATORY_TRACT | Status: AC
  Administered 2013-04-15: 0.5 mg via RESPIRATORY_TRACT
  Filled 2013-04-15: qty 2.5

## 2013-04-15 MED ORDER — PREDNISONE 20 MG PO TABS
60.0000 mg | ORAL_TABLET | Freq: Once | ORAL | Status: AC
  Administered 2013-04-15: 60 mg via ORAL
  Filled 2013-04-15: qty 3

## 2013-04-15 MED ORDER — ALBUTEROL SULFATE (5 MG/ML) 0.5% IN NEBU
2.5000 mg | INHALATION_SOLUTION | Freq: Once | RESPIRATORY_TRACT | Status: AC
  Administered 2013-04-15: 2.5 mg via RESPIRATORY_TRACT
  Filled 2013-04-15: qty 0.5

## 2013-04-15 NOTE — ED Provider Notes (Signed)
CSN: [id number]     Arrival date & time 04/15/13  0302 History   First MD Initiated Contact with Patient 04/15/13 [phone]     Chief Complaint  Patient presents with  . Asthma   (Consider location/radiation/quality/duration/timing/severity/associated sxs/prior Treatment) HPI 27 year old female presents to emergency room from home with complaint of shortness of breath ongoing for last 2 weeks.  Patient reports she was called in amoxicillin for possible sinus infection.  Aside from the shortness of breath and wheezing she is also complaining of nasal congestion.  She has used Afrin with some mild improvement.  She reports Atrovent given to her in the past.  Has not helped.  She, reports she's also prescribed Flonase, but is unable to afford it.  She does not currently have a primary care Dr., no prior intubations and no hospitalizations for her asthma.  She has never seen a pulmonologist.  She is not a smoker.  No fevers no chills.  Past Medical History  Diagnosis Date  . Seasonal allergies   . Asthma   . Bronchitis    History reviewed. No pertinent past surgical history. Family History  Problem Relation Age of Onset  . Diabetes Mother   . Cancer Mother   . Hypertension Mother    History  Substance Use Topics  . Smoking status: Never Smoker   . Smokeless tobacco: Not on file  . Alcohol Use: Yes   OB History   Grav Para Term Preterm Abortions TAB SAB Ect Mult Living                 Review of Systems  All other systems reviewed and are negative.    Allergies  Review of patient's allergies indicates no known allergies.  Home Medications   Current Outpatient Rx  Name  Route  Sig  Dispense  Refill  . ipratropium (ATROVENT HFA) 17 MCG/ACT inhaler   Inhalation   Inhale 2 puffs into the lungs every 6 (six) hours.         Marland Kitchen amoxicillin-clavulanate (AUGMENTIN) 875-125 MG per tablet               . fexofenadine-pseudoephedrine (ALLEGRA-D 24 HOUR) 180-240 MG per 24 hr  tablet   Oral   Take 1 tablet by mouth daily.   30 tablet   0   . predniSONE (DELTASONE) 20 MG tablet   Oral   Take 3 tablets (60 mg total) by mouth daily.   15 tablet   0    BP 121/63  Pulse 89  Temp(Src) 97.4 F (36.3 C) (Oral)  Resp 18  SpO2 99%  LMP 04/01/2013 Physical Exam  Nursing note and vitals reviewed. Constitutional: She is oriented to person, place, and time. She appears well-developed and well-nourished. No distress.  HENT:  Head: Normocephalic and atraumatic.  Right Ear: External ear normal.  Left Ear: External ear normal.  Mouth/Throat: Oropharynx is clear and moist.  Nasal congestion  Eyes: Conjunctivae and EOM are normal. Pupils are equal, round, and reactive to light.  Neck: Normal range of motion. Neck supple. No JVD present. No tracheal deviation present. No thyromegaly present.  Cardiovascular: Normal rate, regular rhythm, normal heart sounds and intact distal pulses.  Exam reveals no gallop and no friction rub.   No murmur heard. Pulmonary/Chest: Effort normal. No stridor. No respiratory distress. She has wheezes. She has no rales. She exhibits no tenderness.  Abdominal: Soft. Bowel sounds are normal. She exhibits no distension and no mass. There is no  tenderness. There is no rebound and no guarding.  Musculoskeletal: Normal range of motion. She exhibits no edema and no tenderness.  Lymphadenopathy:    She has no cervical adenopathy.  Neurological: She is alert and oriented to person, place, and time. She exhibits normal muscle tone. Coordination normal.  Skin: Skin is warm and dry. No rash noted. She is not diaphoretic. No erythema. No pallor.  Psychiatric: She has a normal mood and affect. Her behavior is normal. Judgment and thought content normal.    ED Course  Procedures (including critical care time) Labs Review Labs Reviewed - No data to display Imaging Review No results found.   MDM   1. Asthma exacerbation    27 year old female  with asthma exacerbation.  Wheezing resolved after one neb treatment.  We'll place on short course of prednisone.  Patient also given Flonase.   Olivia Mackie, MD 04/15/13 [phone]

## 2013-04-15 NOTE — ED Notes (Signed)
Per pt, has had increased shortness of breath for 2 weeks.  Called MD and placed on amoxicillan for sinus infection.  No improvement.  Pt has hx of asthma

## 2013-05-19 ENCOUNTER — Encounter (HOSPITAL_COMMUNITY): Payer: Self-pay | Admitting: Emergency Medicine

## 2013-05-19 ENCOUNTER — Emergency Department (HOSPITAL_COMMUNITY)
Admission: EM | Admit: 2013-05-19 | Discharge: 2013-05-20 | Disposition: A | Discharge status: Home / Self Care | Payer: Managed Care, Other (non HMO) | Attending: Emergency Medicine | Admitting: Emergency Medicine

## 2013-05-19 DIAGNOSIS — Z79899 Other long term (current) drug therapy: Secondary | ICD-10-CM | POA: Insufficient documentation

## 2013-05-19 DIAGNOSIS — IMO0002 Reserved for concepts with insufficient information to code with codable children: Secondary | ICD-10-CM | POA: Insufficient documentation

## 2013-05-19 DIAGNOSIS — J45901 Unspecified asthma with (acute) exacerbation: Secondary | ICD-10-CM | POA: Insufficient documentation

## 2013-05-19 MED ORDER — ALBUTEROL (5 MG/ML) CONTINUOUS INHALATION SOLN
10.0000 mg/h | INHALATION_SOLUTION | Freq: Once | RESPIRATORY_TRACT | Status: AC
  Administered 2013-05-19: 10 mg/h via RESPIRATORY_TRACT

## 2013-05-19 MED ORDER — ALBUTEROL SULFATE (5 MG/ML) 0.5% IN NEBU
5.0000 mg | INHALATION_SOLUTION | Freq: Once | RESPIRATORY_TRACT | Status: DC
  Filled 2013-05-19: qty 1

## 2013-05-19 MED ORDER — PREDNISONE 20 MG PO TABS
60.0000 mg | ORAL_TABLET | Freq: Once | ORAL | Status: AC
  Administered 2013-05-20: 60 mg via ORAL
  Filled 2013-05-19: qty 3

## 2013-05-19 MED ORDER — ALBUTEROL (5 MG/ML) CONTINUOUS INHALATION SOLN
INHALATION_SOLUTION | RESPIRATORY_TRACT | Status: AC
  Filled 2013-05-19: qty 20

## 2013-05-19 MED ORDER — IPRATROPIUM BROMIDE 0.02 % IN SOLN
0.5000 mg | Freq: Once | RESPIRATORY_TRACT | Status: DC
  Filled 2013-05-19: qty 2.5

## 2013-05-19 NOTE — ED Provider Notes (Signed)
CSN: [id number]     Arrival date & time 05/19/13  2319 History   First MD Initiated Contact with Patient 05/19/13 2326     Chief Complaint  Patient presents with  . Asthma   (Consider location/radiation/quality/duration/timing/severity/associated sxs/prior Treatment) HPI History provided by pt.   Pt has h/o asthma, exacerbations infrequent, not on maintenance drugs.  Developed typical asthma sx, coughing, wheezing and SOB, one week ago, but had relief w/ her rescue inhaler.  Sx worsened today and refractory to albuterol.  Went to [employer] this morning, felt better after receiving a breathing treatment and was sent home.  Comes to ED now because her symptoms have gradually worsened since then and she now has severe chest tightness and inability to complete sentences.  No associated fever or worse than baseline nasal congestion/rhinorrhea.   Past Medical History  Diagnosis Date  . Seasonal allergies   . Asthma   . Bronchitis    History reviewed. No pertinent past surgical history. Family History  Problem Relation Age of Onset  . Diabetes Mother   . Cancer Mother   . Hypertension Mother    History  Substance Use Topics  . Smoking status: Never Smoker   . Smokeless tobacco: Not on file  . Alcohol Use: Yes   OB History   Grav Para Term Preterm Abortions TAB SAB Ect Mult Living                 Review of Systems  All other systems reviewed and are negative.    Allergies  Review of patient's allergies indicates no known allergies.  Home Medications   Current Outpatient Rx  Name  Route  Sig  Dispense  Refill  . fexofenadine-pseudoephedrine (ALLEGRA-D 24 HOUR) 180-240 MG per 24 hr tablet   Oral   Take 1 tablet by mouth daily.   30 tablet   0   . fluticasone (FLONASE) 50 MCG/ACT nasal spray   Each Nare   Place 1 spray into both nostrils every morning.         Marland Kitchen ipratropium (ATROVENT HFA) 17 MCG/ACT inhaler   Inhalation   Inhale 2 puffs into the lungs every 6 (six)  hours.         . VENTOLIN HFA 108 (90 BASE) MCG/ACT inhaler   Inhalation   Inhale 1-2 puffs into the lungs every 6 (six) hours as needed for wheezing or shortness of breath.           BP 128/95  Pulse 115  Resp 22  SpO2 97%  LMP 05/05/2013 Physical Exam  Nursing note and vitals reviewed. Constitutional: She is oriented to person, place, and time. She appears well-developed and well-nourished. No distress.  HENT:  Head: Normocephalic and atraumatic.  Eyes:  Normal appearance  Neck: Normal range of motion.  Cardiovascular: Normal rate and regular rhythm.   Pulmonary/Chest: Effort normal and breath sounds normal. No respiratory distress.  Pt is speaking in complete sentences.  No coughing currently.  Diffuse inspiratory and expiratory wheezing w/ prolonged expiratory phase.   Musculoskeletal: Normal range of motion.  Neurological: She is alert and oriented to person, place, and time.  Skin: Skin is warm and dry. No rash noted.  Psychiatric: She has a normal mood and affect. Her behavior is normal.    ED Course  Procedures (including critical care time) Labs Review Labs Reviewed - No data to display Imaging Review No results found.  EKG Interpretation   None  MDM   1. Asthma exacerbation    27yo asthmatic F presents w/ asthma exacerbation.  Tachycardic, respiratory distress w/ inability to complete sentences and coughing on exam in triage.  Pt has received a nebulizer treatment and prednisone and on my initial exam, completing sentences and no coughing but diffuse inspiratory and expiratory wheezing.  She reports feeling a little bit better.  15mg  continuous neb ordered. 12:52 AM   Pt reports feeling better, though shaky.  VSS, continues to be mildly tachycardic.  Wheezing markedly improved.  Will ambulate and then d/c home if she does not become dyspneic or drop her O2 sat.  2:43 AM   Otilio Miu, PA-C 05/20/13 2014

## 2013-05-19 NOTE — ED Notes (Signed)
Pt arrived to ED with an exacerbation of her asthma.  Pt seen at [facility] earlier today.  Pt is having shortness of breath and is coughing.  Pt has a hx of asthma.

## 2013-05-20 MED ORDER — ALBUTEROL SULFATE HFA 108 (90 BASE) MCG/ACT IN AERS
2.0000 | INHALATION_SPRAY | Freq: Once | RESPIRATORY_TRACT | Status: AC
  Administered 2013-05-20: 2 via RESPIRATORY_TRACT
  Filled 2013-05-20: qty 6.7

## 2013-05-20 MED ORDER — ALBUTEROL (5 MG/ML) CONTINUOUS INHALATION SOLN
15.0000 mg/h | INHALATION_SOLUTION | RESPIRATORY_TRACT | Status: DC
  Administered 2013-05-20: 15 mg/h via RESPIRATORY_TRACT
  Filled 2013-05-20: qty 20

## 2013-05-20 NOTE — ED Provider Notes (Signed)
Medical screening examination/treatment/procedure(s) were performed by non-physician practitioner and as supervising physician I was immediately available for consultation/collaboration.    Taunja Brickner M Jessenya Berdan, MD 05/20/13 2130 

## 2013-05-20 NOTE — ED Notes (Signed)
Patient is alert and oriented x3.  She was given DC instructions and follow up visit instructions.  Patient gave verbal understanding. She was DC ambulatory under her own power to home.  V/S stable.  He was not showing any signs of distress on DC 

## 2013-09-11 NOTE — Pre-Procedure Instructions (Signed)
Julie RaringKristy Y Fabio  09/11/2013   Your procedure is scheduled on:  Thursday, April 16th   [facility]  [facility] 3   Entrance  A, proceed to admitting at 7:30 AM.   Call this number if you have problems the morning of surgery: [phone]   Remember:   Do not eat food or drink liquids after midnight Wednesday.   Take these medicines the morning of surgery - - Inhalers, Flonase   Do not wear jewelry, make-up or nail polish.  Do not wear lotions, powders, or perfumes. You may wear deodorant.  Do not shave underarms & legs 48 hours prior to surgery.    Do not bring valuables to the hospital.  [facility] not [facility] for any belongings or valuables.               Contacts, dentures or bridgework may not be worn into surgery.  Leave suitcase in the car. After surgery it may be brought to your room.  For patients admitted to the hospital, discharge time is determined by your treatment team.               Patients discharged the day of surgery will not be allowed to drive home.   Name and phone number of your driver:    Special Instructions: "Preparing for Surgery" instruction Sheet   Please read over the following fact sheets that you were given: Pain Booklet and Surgical Site Infection Prevention

## 2013-09-12 ENCOUNTER — Encounter (HOSPITAL_COMMUNITY)
Admission: RE | Admit: 2013-09-12 | Discharge: 2013-09-12 | Disposition: A | Discharge status: Home / Self Care | Payer: Managed Care, Other (non HMO) | Source: Ambulatory Visit | Attending: Otolaryngology | Admitting: Otolaryngology

## 2013-09-12 ENCOUNTER — Other Ambulatory Visit: Payer: Self-pay | Admitting: Otolaryngology

## 2013-09-12 ENCOUNTER — Encounter (HOSPITAL_COMMUNITY): Payer: Self-pay

## 2013-09-12 ENCOUNTER — Encounter (HOSPITAL_COMMUNITY): Payer: Self-pay | Admitting: Pharmacy Technician

## 2013-09-12 HISTORY — DX: Anxiety disorder, unspecified: F41.9

## 2013-09-12 HISTORY — DX: Cramp and spasm: R25.2

## 2013-09-12 HISTORY — DX: Headache: R51

## 2013-09-12 LAB — BASIC METABOLIC PANEL
BUN: 8 mg/dL (ref 6–23)
CALCIUM: 9.2 mg/dL (ref 8.4–10.5)
CO2: 25 mEq/L (ref 19–32)
Chloride: 99 mEq/L (ref 96–112)
Creatinine, Ser: 0.71 mg/dL (ref 0.50–1.10)
GFR calc non Af Amer: >90 mL/min (ref >90)
Glucose, Bld: 89 mg/dL (ref 70–99)
Potassium: 4.5 mEq/L (ref 3.7–5.3)
Sodium: 138 mEq/L (ref 137–147)

## 2013-09-12 LAB — CBC
HCT: 39.8 % (ref 36.0–46.0)
Hemoglobin: 13.2 g/dL (ref 12.0–15.0)
MCH: 27.7 pg (ref 26.0–34.0)
MCHC: 33.2 g/dL (ref 30.0–36.0)
MCV: 83.6 fL (ref 78.0–100.0)
PLATELETS: 376 10*3/uL (ref 150–400)
RBC: 4.76 MIL/uL (ref 3.87–5.11)
RDW: 14.5 % (ref 11.5–15.5)
WBC: 11.2 10*3/uL — AB (ref 4.0–10.5)

## 2013-09-12 LAB — HCG, SERUM, QUALITATIVE: PREG SERUM: NEGATIVE

## 2013-09-12 NOTE — Progress Notes (Signed)
Call to Dr. Thurmon [facility], reported that we do not have preop orders. , spoke /w [employer]

## 2013-09-13 ENCOUNTER — Ambulatory Visit (HOSPITAL_COMMUNITY): Payer: Managed Care, Other (non HMO) | Admitting: Critical Care Medicine

## 2013-09-13 ENCOUNTER — Ambulatory Visit (HOSPITAL_COMMUNITY)
Admission: RE | Admit: 2013-09-13 | Discharge: 2013-09-13 | Disposition: A | Discharge status: Home / Self Care | Payer: Managed Care, Other (non HMO) | Source: Ambulatory Visit | Attending: Otolaryngology | Admitting: Otolaryngology

## 2013-09-13 ENCOUNTER — Encounter (HOSPITAL_COMMUNITY): Payer: Self-pay | Admitting: *Deleted

## 2013-09-13 ENCOUNTER — Encounter (HOSPITAL_COMMUNITY): Payer: Managed Care, Other (non HMO) | Admitting: Critical Care Medicine

## 2013-09-13 ENCOUNTER — Encounter (HOSPITAL_COMMUNITY)
Admission: RE | Disposition: A | Discharge status: Home / Self Care | Payer: Self-pay | Source: Ambulatory Visit | Attending: Otolaryngology

## 2013-09-13 DIAGNOSIS — F411 Generalized anxiety disorder: Secondary | ICD-10-CM | POA: Insufficient documentation

## 2013-09-13 DIAGNOSIS — J45909 Unspecified asthma, uncomplicated: Secondary | ICD-10-CM | POA: Insufficient documentation

## 2013-09-13 DIAGNOSIS — J329 Chronic sinusitis, unspecified: Secondary | ICD-10-CM

## 2013-09-13 DIAGNOSIS — J338 Other polyp of sinus: Secondary | ICD-10-CM | POA: Insufficient documentation

## 2013-09-13 DIAGNOSIS — J339 Nasal polyp, unspecified: Secondary | ICD-10-CM | POA: Diagnosis present

## 2013-09-13 HISTORY — PX: SINUS ENDO W/FUSION: SHX777

## 2013-09-13 SURGERY — SINUS SURGERY, ENDOSCOPIC, USING COMPUTER-ASSISTED NAVIGATION
Anesthesia: General | Site: Nose | Laterality: Right

## 2013-09-13 MED ORDER — NEOSTIGMINE METHYLSULFATE 1 MG/ML IJ SOLN
INTRAMUSCULAR | Status: DC | PRN
  Administered 2013-09-13: 3 mg via INTRAVENOUS

## 2013-09-13 MED ORDER — MUPIROCIN CALCIUM 2 % EX CREA
TOPICAL_CREAM | CUTANEOUS | Status: DC | PRN
Start: 2013-09-13 — End: 2013-09-13
  Administered 2013-09-13: 1 via TOPICAL

## 2013-09-13 MED ORDER — GLYCOPYRROLATE 0.2 MG/ML IJ SOLN
INTRAMUSCULAR | Status: AC
  Filled 2013-09-13: qty 2

## 2013-09-13 MED ORDER — LACTATED RINGERS IV SOLN
INTRAVENOUS | Status: DC
  Administered 2013-09-13 (×2): via INTRAVENOUS

## 2013-09-13 MED ORDER — LIDOCAINE-EPINEPHRINE 1 %-1:100000 IJ SOLN
INTRAMUSCULAR | Status: DC | PRN
  Administered 2013-09-13: 20 mL

## 2013-09-13 MED ORDER — TRIAMCINOLONE ACETONIDE 40 MG/ML IJ SUSP
INTRAMUSCULAR | Status: AC
  Filled 2013-09-13: qty 5

## 2013-09-13 MED ORDER — PROPOFOL 10 MG/ML IV BOLUS
INTRAVENOUS | Status: AC
  Filled 2013-09-13: qty 20

## 2013-09-13 MED ORDER — OXYMETAZOLINE HCL 0.05 % NA SOLN
NASAL | Status: DC | PRN
  Administered 2013-09-13: 1 via NASAL

## 2013-09-13 MED ORDER — HYDROMORPHONE HCL PF 1 MG/ML IJ SOLN
0.2500 mg | INTRAMUSCULAR | Status: DC | PRN
  Administered 2013-09-13 (×2): 0.5 mg via INTRAVENOUS

## 2013-09-13 MED ORDER — ROCURONIUM BROMIDE 100 MG/10ML IV SOLN
INTRAVENOUS | Status: DC | PRN
  Administered 2013-09-13: 50 mg via INTRAVENOUS

## 2013-09-13 MED ORDER — ONDANSETRON HCL 4 MG/2ML IJ SOLN
INTRAMUSCULAR | Status: DC | PRN
  Administered 2013-09-13: 4 mg via INTRAVENOUS

## 2013-09-13 MED ORDER — ARTIFICIAL TEARS OP OINT
TOPICAL_OINTMENT | OPHTHALMIC | Status: DC | PRN
  Administered 2013-09-13: 1 via OPHTHALMIC

## 2013-09-13 MED ORDER — MIDAZOLAM HCL 2 MG/2ML IJ SOLN
INTRAMUSCULAR | Status: AC
  Filled 2013-09-13: qty 2

## 2013-09-13 MED ORDER — MIDAZOLAM HCL 5 MG/5ML IJ SOLN
INTRAMUSCULAR | Status: DC | PRN
  Administered 2013-09-13: 2 mg via INTRAVENOUS

## 2013-09-13 MED ORDER — FENTANYL CITRATE 0.05 MG/ML IJ SOLN
INTRAMUSCULAR | Status: AC
  Filled 2013-09-13: qty 5

## 2013-09-13 MED ORDER — ONDANSETRON HCL 4 MG/2ML IJ SOLN
INTRAMUSCULAR | Status: AC
  Filled 2013-09-13: qty 2

## 2013-09-13 MED ORDER — PROPOFOL 10 MG/ML IV BOLUS
INTRAVENOUS | Status: DC | PRN
Start: 2013-09-13 — End: 2013-09-13
  Administered 2013-09-13: 170 mg via INTRAVENOUS
  Administered 2013-09-13: 30 mg via INTRAVENOUS

## 2013-09-13 MED ORDER — TRIAMCINOLONE ACETONIDE 40 MG/ML IJ SUSP
INTRAMUSCULAR | Status: DC | PRN
  Administered 2013-09-13: 40 mg via INTRAMUSCULAR

## 2013-09-13 MED ORDER — HYDROMORPHONE HCL PF 1 MG/ML IJ SOLN
INTRAMUSCULAR | Status: AC
  Administered 2013-09-13: 0.5 mg via INTRAVENOUS
  Filled 2013-09-13: qty 1

## 2013-09-13 MED ORDER — FENTANYL CITRATE 0.05 MG/ML IJ SOLN
INTRAMUSCULAR | Status: DC | PRN
  Administered 2013-09-13: 50 ug via INTRAVENOUS
  Administered 2013-09-13: 100 ug via INTRAVENOUS
  Administered 2013-09-13 (×3): 50 ug via INTRAVENOUS

## 2013-09-13 MED ORDER — MUPIROCIN CALCIUM 2 % EX CREA
TOPICAL_CREAM | CUTANEOUS | Status: AC
  Filled 2013-09-13: qty 15

## 2013-09-13 MED ORDER — CEFAZOLIN SODIUM-DEXTROSE 2-3 GM-% IV SOLR
INTRAVENOUS | Status: AC
  Filled 2013-09-13: qty 50

## 2013-09-13 MED ORDER — SODIUM CHLORIDE 0.9 % IR SOLN
Status: DC | PRN
  Administered 2013-09-13: 1000 mL

## 2013-09-13 MED ORDER — OXYMETAZOLINE HCL 0.05 % NA SOLN
NASAL | Status: AC
  Filled 2013-09-13: qty 15

## 2013-09-13 MED ORDER — ARTIFICIAL TEARS OP OINT
TOPICAL_OINTMENT | OPHTHALMIC | Status: AC
  Filled 2013-09-13: qty 3.5

## 2013-09-13 MED ORDER — GLYCOPYRROLATE 0.2 MG/ML IJ SOLN
INTRAMUSCULAR | Status: DC | PRN
  Administered 2013-09-13: 0.4 mg via INTRAVENOUS

## 2013-09-13 MED ORDER — NEOSTIGMINE METHYLSULFATE 1 MG/ML IJ SOLN
INTRAMUSCULAR | Status: AC
  Filled 2013-09-13: qty 10

## 2013-09-13 MED ORDER — LIDOCAINE HCL (CARDIAC) 20 MG/ML IV SOLN
INTRAVENOUS | Status: DC | PRN
  Administered 2013-09-13: 60 mg via INTRAVENOUS

## 2013-09-13 MED ORDER — LIDOCAINE-EPINEPHRINE 1 %-1:100000 IJ SOLN
INTRAMUSCULAR | Status: AC
  Filled 2013-09-13: qty 1

## 2013-09-13 MED ORDER — HYDROCODONE-ACETAMINOPHEN 5-325 MG PO TABS: 1.0000 | ORAL_TABLET | Freq: Four times a day (QID) | ORAL | Status: DC | PRN

## 2013-09-13 MED ORDER — 0.9 % SODIUM CHLORIDE (POUR BTL) OPTIME
TOPICAL | Status: DC | PRN
  Administered 2013-09-13: 1000 mL

## 2013-09-13 MED ORDER — MOXIFLOXACIN HCL 400 MG PO TABS: 400.0000 mg | ORAL_TABLET | Freq: Every day | ORAL | Status: DC

## 2013-09-13 MED ORDER — DEXAMETHASONE SODIUM PHOSPHATE 4 MG/ML IJ SOLN
INTRAMUSCULAR | Status: DC | PRN
  Administered 2013-09-13: 10 mg via INTRAVENOUS

## 2013-09-13 MED ORDER — ONDANSETRON HCL 4 MG/2ML IJ SOLN: 4.0000 mg | Freq: Once | INTRAMUSCULAR | Status: DC | PRN

## 2013-09-13 MED ORDER — CEFAZOLIN SODIUM-DEXTROSE 2-3 GM-% IV SOLR
INTRAVENOUS | Status: DC | PRN
  Administered 2013-09-13: 2 g via INTRAVENOUS

## 2013-09-13 MED ORDER — LIDOCAINE HCL (CARDIAC) 20 MG/ML IV SOLN
INTRAVENOUS | Status: AC
  Filled 2013-09-13: qty 5

## 2013-09-13 SURGICAL SUPPLY — 38 items
BLADE ROTATE RAD 40 4 M4 (BLADE) ×1 IMPLANT
BLADE ROTATE RAD 40 4MM M4 (BLADE) ×1
BLADE ROTATE TRICUT 4MX13CM M4 (BLADE) ×1
BLADE ROTATE TRICUT 4X13 M4 (BLADE) ×2 IMPLANT
CANISTER SUCTION 2500CC (MISCELLANEOUS) ×6 IMPLANT
COAGULATOR SUCT 6 FR SWTCH (ELECTROSURGICAL) ×1
COAGULATOR SUCT 8FR VV (MISCELLANEOUS) ×2 IMPLANT
COAGULATOR SUCT SWTCH 10FR 6 (ELECTROSURGICAL) ×1 IMPLANT
DRESSING NASAL KENNEDY 3.5X.9 (MISCELLANEOUS) IMPLANT
DRESSING TELFA 8X10 (GAUZE/BANDAGES/DRESSINGS) ×2 IMPLANT
DRSG NASAL KENNEDY 3.5X.9 (MISCELLANEOUS) ×3
ELECT REM PT RETURN 9FT ADLT (ELECTROSURGICAL) ×3
ELECTRODE REM PT RTRN 9FT ADLT (ELECTROSURGICAL) ×1 IMPLANT
FILTER ARTHROSCOPY CONVERTOR (FILTER) ×4 IMPLANT
GLOVE BIOGEL M 7.0 STRL (GLOVE) ×6 IMPLANT
GOWN STRL REUS W/ TWL LRG LVL3 (GOWN DISPOSABLE) ×2 IMPLANT
GOWN STRL REUS W/TWL LRG LVL3 (GOWN DISPOSABLE) ×9
KIT BASIN OR (CUSTOM PROCEDURE TRAY) ×3 IMPLANT
KIT ROOM TURNOVER OR (KITS) ×3 IMPLANT
NDL 18GX1X1/2 (RX/OR ONLY) (NEEDLE) IMPLANT
NEEDLE 18GX1X1/2 (RX/OR ONLY) (NEEDLE) ×3 IMPLANT
NS IRRIG 1000ML POUR BTL (IV SOLUTION) ×3 IMPLANT
PAD ARMBOARD 7.5X6 YLW CONV (MISCELLANEOUS) ×6 IMPLANT
PAD ENT ADHESIVE 25PK (MISCELLANEOUS) ×3 IMPLANT
SPECIMEN JAR SMALL (MISCELLANEOUS) ×4 IMPLANT
SPONGE NEURO XRAY DETECT 1X3 (DISPOSABLE) ×3 IMPLANT
SYR CONTROL 10ML LL (SYRINGE) ×2 IMPLANT
TOWEL OR 17X24 6PK STRL BLUE (TOWEL DISPOSABLE) ×3 IMPLANT
TOWEL OR 17X26 10 PK STRL BLUE (TOWEL DISPOSABLE) ×3 IMPLANT
TRACKER ENT INSTRUMENT (MISCELLANEOUS) ×3 IMPLANT
TRACKER ENT PATIENT (MISCELLANEOUS) ×3 IMPLANT
TRAY ENT MC OR (CUSTOM PROCEDURE TRAY) ×3 IMPLANT
TUBE CONNECTING 12'X1/4 (SUCTIONS) ×1
TUBE CONNECTING 12X1/4 (SUCTIONS) ×2 IMPLANT
TUBING EXTENTION W/L.L. (IV SETS) ×3 IMPLANT
TUBING STRAIGHTSHOT EPS 5PK (TUBING) ×3 IMPLANT
WATER STERILE IRR 1000ML POUR (IV SOLUTION) ×3 IMPLANT
WIPE INSTRUMENT VISIWIPE 73X73 (MISCELLANEOUS) ×3 IMPLANT

## 2013-09-13 NOTE — Brief Op Note (Signed)
09/13/2013  12:11 PM  PATIENT:  Julie AddisonKristy Y Cardenas  28 y.o. female  PRE-OPERATIVE DIAGNOSIS:  Chronic sinusitis  POST-OPERATIVE DIAGNOSIS:  Chronic sinusitis  PROCEDURE:  Procedure(s): BILATERAL ENDOSCOPIC SINUS SURGERY WITH FUSION SCAN (Bilateral)  SURGEON:  Surgeon(s) and Role:    * Osborn Cohoavid Cylinda Santoli, MD - Primary  PHYSICIAN ASSISTANT:   ASSISTANTS: none   ANESTHESIA:   general  EBL:  Total I/O In: 1900 [I.V.:1900] Out: 600 [Blood:600]  BLOOD ADMINISTERED:none  DRAINS: none   LOCAL MEDICATIONS USED:  LIDOCAINE  and Amount: 6 ml  SPECIMEN:  Source of Specimen:  Right sinus contents  DISPOSITION OF SPECIMEN:  PATHOLOGY  COUNTS:  YES  TOURNIQUET:  * No tourniquets in log *  DICTATION: .Other Dictation: Dictation Number [id number]  PLAN OF CARE: Discharge to home after PACU  PATIENT DISPOSITION:  PACU - hemodynamically stable.   Delay start of Pharmacological VTE agent (>24hrs) due to surgical blood loss or risk of bleeding: not applicable

## 2013-09-13 NOTE — Transfer of Care (Signed)
Immediate Anesthesia Transfer of Care Note  Patient: Julie AddisonKristy Y Cardenas  Procedure(s) Performed: Procedure(s): BILATERAL ENDOSCOPIC SINUS SURGERY WITH FUSION SCAN (Bilateral)  Patient Location: PACU  Anesthesia Type:General  Level of Consciousness: awake and alert   Airway & Oxygen Therapy: Patient Spontanous Breathing and Patient connected to face mask oxygen  Post-op Assessment: Report given to PACU RN, Post -op Vital signs reviewed and stable and Patient moving all extremities X 4  Post vital signs: Reviewed and stable  Complications: No apparent anesthesia complications

## 2013-09-13 NOTE — Anesthesia Preprocedure Evaluation (Addendum)
Anesthesia Evaluation  Patient identified by MRN, date of birth, ID band Patient awake    Reviewed: Allergy & Precautions, H&P , NPO status , Patient's Chart, lab work & pertinent test results  Airway Mallampati: III TM Distance: >3 FB Neck ROM: Full    Dental  (+) Dental Advisory Given, Teeth Intact   Pulmonary asthma ,          Cardiovascular     Neuro/Psych  Headaches, PSYCHIATRIC DISORDERS Anxiety    GI/Hepatic   Endo/Other  Morbid obesity  Renal/GU      Musculoskeletal   Abdominal   Peds  Hematology   Anesthesia Other Findings   Reproductive/Obstetrics                          Anesthesia Physical Anesthesia Plan  ASA: II  Anesthesia Plan: General   Post-op Pain Management:    Induction: Intravenous  Airway Management Planned: Oral ETT  Additional Equipment:   Intra-op Plan:   Post-operative Plan: Extubation in OR  Informed Consent: I have reviewed the patients History and Physical, chart, labs and discussed the procedure including the risks, benefits and alternatives for the proposed anesthesia with the patient or authorized representative who has indicated his/her understanding and acceptance.   Dental advisory given  Plan Discussed with: Anesthesiologist and Surgeon  Anesthesia Plan Comments:         Anesthesia Quick Evaluation

## 2013-09-13 NOTE — Anesthesia Procedure Notes (Signed)
Procedure Name: Intubation Date/Time: 09/13/2013 10:16 AM Performed by: Elon AlasLEE, Yonah Tangeman BROWN Pre-anesthesia Checklist: Timeout performed, Patient identified, Emergency Drugs available, Suction available and Patient being monitored Patient Re-evaluated:Patient Re-evaluated prior to inductionOxygen Delivery Method: Circle system utilized Preoxygenation: Pre-oxygenation with 100% oxygen Intubation Type: IV induction Ventilation: Mask ventilation without difficulty Laryngoscope Size: Mac and 3 Grade View: Grade I Tube type: Oral Tube size: 7.5 mm Number of attempts: 1 Airway Equipment and Method: Stylet Placement Confirmation: positive ETCO2,  ETT inserted through vocal cords under direct vision,  breath sounds checked- equal and bilateral and CO2 detector Secured at: 22 cm Tube secured with: Tape Dental Injury: Teeth and Oropharynx as per pre-operative assessment

## 2013-09-13 NOTE — Addendum Note (Signed)
Addended by: Kyo Cocuzza on: 09/13/2013 09:53 AM   Modules accepted: Orders  

## 2013-09-13 NOTE — Anesthesia Postprocedure Evaluation (Signed)
  Anesthesia Post-op Note  Patient: Julie Cardenas  Procedure(s) Performed: Procedure(s): RIGHT ENDOSCOPIC SINUS SURGERY WITH FUSION SCAN (Right)  Patient Location: PACU  Anesthesia Type:General  Level of Consciousness: awake and alert   Airway and Oxygen Therapy: Patient Spontanous Breathing  Post-op Pain: none  Post-op Assessment: Post-op Vital signs reviewed, Patient's Cardiovascular Status Stable, Respiratory Function Stable, Patent Airway, No signs of Nausea or vomiting and Pain level controlled  Post-op Vital Signs: Reviewed and stable  Last Vitals:  Filed Vitals:   09/13/13 1300  BP: 111/79  Pulse: 60  Temp: 36.9 C  Resp: 9    Complications: No apparent anesthesia complications

## 2013-09-13 NOTE — Discharge Instructions (Signed)

## 2013-09-13 NOTE — H&P (Signed)
Julie Cardenas is an 28 y.o. female.   Chief Complaint: Nasal Obstruction HPI: Chronic nasal polyposis  Past Medical History  Diagnosis Date  . Seasonal allergies   . Asthma   . Bronchitis   . Anxiety     no medicine, prayers aid her   . Headache(784.0)     sinus related  . Leg cramps     PCP- advised her to exercise     History reviewed. No pertinent past surgical history.  Family History  Problem Relation Age of Onset  . Diabetes Mother   . Cancer Mother   . Hypertension Mother    Social History:  reports that she has never smoked. She does not have any smokeless tobacco history on file. She reports that she drinks alcohol. She reports that she does not use illicit drugs.  Allergies: No Known Allergies  Medications Prior to Admission  Medication Sig Dispense Refill  . albuterol (PROVENTIL HFA;VENTOLIN HFA) 108 (90 BASE) MCG/ACT inhaler Inhale 2 puffs into the lungs every 6 (six) hours as needed for wheezing or shortness of breath.       . fexofenadine-pseudoephedrine (ALLEGRA-D 24 HOUR) 180-240 MG per 24 hr tablet Take 1 tablet by mouth daily.  30 tablet  0  . Fluticasone Furoate-Vilanterol (BREO ELLIPTA) 100-25 MCG/INH AEPB Inhale 1 puff into the lungs daily.      . methylPREDNISolone (MEDROL) 4 MG tablet Take 4 mg by mouth daily.      . Multiple Vitamins-Minerals (ONE-A-DAY WOMENS VITACRAVES) CHEW Chew 2 tablets by mouth daily.      Marland Kitchen triamcinolone ointment (KENALOG) 0.1 % Apply 1 application topically daily.      Marland Kitchen ipratropium (ATROVENT HFA) 17 MCG/ACT inhaler Inhale 2 puffs into the lungs every 6 (six) hours.        Results for orders placed during the hospital encounter of 09/12/13 (from the past 48 hour(s))  BASIC METABOLIC PANEL     Status: None   Collection Time    09/12/13  8:49 AM      Result Value Ref Range   Sodium 138  137 - 147 mEq/L   Potassium 4.5  3.7 - 5.3 mEq/L   Chloride 99  96 - 112 mEq/L   CO2 25  19 - 32 mEq/L   Glucose, Bld 89  70 - 99 mg/dL    BUN 8  6 - 23 mg/dL   Creatinine, Ser 0.71  0.50 - 1.10 mg/dL   Calcium 9.2  8.4 - 10.5 mg/dL   GFR calc non Af Amer >90  >90 mL/min   GFR calc Af Amer >90  >90 mL/min   Comment: (NOTE)     The eGFR has been calculated using the CKD EPI equation.     This calculation has not been validated in all clinical situations.     eGFR's persistently <90 mL/min signify possible Chronic Kidney     Disease.  CBC     Status: Abnormal   Collection Time    09/12/13  8:49 AM      Result Value Ref Range   WBC 11.2 (*) 4.0 - 10.5 K/uL   RBC 4.76  3.87 - 5.11 MIL/uL   Hemoglobin 13.2  12.0 - 15.0 g/dL   HCT 39.8  36.0 - 46.0 %   MCV 83.6  78.0 - 100.0 fL   MCH 27.7  26.0 - 34.0 pg   MCHC 33.2  30.0 - 36.0 g/dL   RDW 14.5  11.5 -  15.5 %   Platelets 376  150 - 400 K/uL  HCG, SERUM, QUALITATIVE     Status: None   Collection Time    09/12/13  8:49 AM      Result Value Ref Range   Preg, Serum NEGATIVE  NEGATIVE   Comment:            THE SENSITIVITY OF THIS     METHODOLOGY IS >10 mIU/mL.   No results found.  Review of Systems  Constitutional: Negative.   HENT: Negative.   Respiratory: Negative.   Cardiovascular: Negative.   Gastrointestinal: Negative.   Genitourinary: Negative.     Blood pressure 136/65, pulse 81, temperature 98 F (36.7 C), temperature source Oral, resp. rate 16, last menstrual period 08/27/2013, SpO2 100.00%. Physical Exam  Constitutional: She is oriented to person, place, and time. She appears well-developed and well-nourished.  HENT:  Complete nasal airway obstruction from nasal polyps  Neck: Normal range of motion. Neck supple.  Cardiovascular: Normal rate.   Respiratory: Effort normal.  GI: Soft.  Musculoskeletal: Normal range of motion.  Neurological: She is alert and oriented to person, place, and time.     Assessment/Plan Adm for OP sinus surgery  [name] 09/13/2013, 9:54 AM

## 2013-09-14 NOTE — Op Note (Signed)
NAME:  Julie Cardenas, Julie Cardenas                 ACCOUNT NO.:  [id number]  MEDICAL RECORD NO.:  [id number]  LOCATION:  [facility]                         FACILITY:  [facility]  PHYSICIAN:  Kinnie Scalesavid L. Annalee GentaShoemaker, M.D.DATE OF BIRTH:  [birth year]  DATE OF PROCEDURE:  09/13/2013 DATE OF DISCHARGE:  09/13/2013                              OPERATIVE REPORT   LOCATION:  [facility].  PREOPERATIVE DIAGNOSES: 1. Chronic sinusitis. 2. Extensive sinonasal polyposis. 3. Reactive airway disease.  POSTOPERATIVE DIAGNOSES: 1. Chronic sinusitis. 2. Extensive sinonasal polyposis. 3. Reactive airway disease.  INDICATIONS FOR SURGERY: 1. Chronic sinusitis. 2. Extensive sinonasal polyposis. 3. Reactive airway disease.  SURGICAL PROCEDURES: 1. Right endoscopic sinus surgery and nasal polypectomy with     intraoperative computer-assisted navigation (fusion) consisting of     right total ethmoidectomy, right maxillary antrostomy with removal     of diseased tissue and right frontal sinus exploration. 2. Right inferior turbinate reduction.  ANESTHESIA:  General endotracheal.  SURGEON:  Kinnie Scalesavid L. Annalee GentaShoemaker, M.D.  COMPLICATIONS:  None.  ESTIMATED BLOOD LOSS:  Approximately 600 mL.  DISPOSITION:  The patient transferred from the operating room to the recovery room in stable condition.  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  Bactroban and Kenalog slurry was instilled in the right sinus passageway and a Baron sinus pack was placed at the conclusion of the surgical procedure.  BRIEF HISTORY:  The patient is a [age] year old black female referred to our office with a history of progressive symptoms of nasal airway obstruction and chronic sinusitis.  She has a prior history of allergies and reactive airway  disease with aspirin sensitivity.  Examination in the office showed extensive sinonasal polyps with complete opacification of the nasal cavity.  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  The patient had been previously treated with antibiotics, oral and topical steroids, and saline nasal irrigation, and despite appropriate therapy, continued to have progressive symptoms of nasal airway obstruction and chronic congestion with worsening asthma. A CT scan of the sinus was performed after comprehensive medical therapy, and the patient was found to have complete opacification of all sinuses with complete obstruction of the nasal cavity with soft tissue disease consistent with chronic nasal polyposis.  There was some bony expansion of the sinuses particularly in the ethmoid region without evidence of bone erosion or dehiscence.  Given the patient's history, physical findings, and CT as above, we discussed treatment options.  The patient had been appropriately treated with medical therapy with limited improvement, and I recommended endoscopic sinus surgery.  The risks and benefits of the procedure were discussed in detail with the patient and her mother, and they understood and concurred with our plan, which was scheduled on elective basis at [facility].  DESCRIPTION OF PROCEDURE:  The patient brought to the operating room at [facility] on September 13, 2013 and placed in supine position on the operating table.  General endotracheal anesthesia was established without difficulty.  When the patient was adequately anesthetized,  she was positioned on the operating table, and prepped and draped in a sterile fashion.  Her nose was injected with a total of 6 mL of 1% lidocaine 1:100,000 solution epinephrine, which was injected in a submucosal fashion along the intranasal polyps, lateral nasal wall, and via transoral's sphenopalatine block bilaterally.   The patient's nose was then packed with Afrin-soaked cotton pledgets and were left in place for approximately 10 minutes to allow for vasoconstriction hemostasis. The fusion navigation headgear was applied, and anatomic and surgical landmarks were identified and confirmed.  The fusion navigation device was used throughout the sinus portion of the surgical procedure.  When the patient prepared for surgery, nasal endoscopy was performed. The patient had complete obstruction of the nasal passageways bilaterally on her preoperative navigation CT scan.  The right had most involvement of the sinuses with complete opacification versus the left, which showed an air-fluid level in the left maxillary sinus.  We opted to begin surgery on the right-hand side.  Using a 0-degree telescope and a straight microdebrider, polypoid material from the entire nasal cavity was carefully dissected to the level of the middle meatus and superior meatus within the nasal cavity.  There were significant polypoid changes within the inferior and middle turbinate.  These were completely resected and the middle meatus was explored.  A total ethmoidectomy was then performed, dissecting from anterior to posterior along the floor of the ethmoid sinus.  Polypoid disease and bony septations were completely removed creating a widely patent ethmoid cavity.  The posterior-superior aspect of the ethmoid sinus was identified, and using a curved microdebrider 45-degree telescope and navigation, dissection was then carried from posterior to anterior along the roof of the ethmoid sinus, again removing bony septations and extensive polypoid disease.  The nasal frontal recess was identified and this was completely occluded with underlying polypoid disease as well as polypoid sinus changes at the rim of the nasal frontal recess.  The frontal sinus itself was filled with heavy mucinous debris, which was suctioned and sent to Pathology  for identification of allergic fungal mucin.  Attention was then turned to the lateral nasal wall where the natural ostium of the maxillary sinus was identified.  This was completely occluded with polypoid disease.  Using a straight through-cutting forceps, the patient's maxillary ostium was enlarged posteriorly.  There was a large posterior accessory ostium and there was polypoid material and thick mucoid discharge emanating from that area.  The intervening soft tissue was taken down, and exploration of the right maxillary sinus was undertaken using a curved microdebrider and suction.  Polypoid material was removed within the right maxillary sinus creating a widely patent sinus ostium.  At this point, in the surgical procedure, blood loss was assessed.  There was approximately 600 mL of surgical blood loss, and given the extensive nature of the patient's polyps, we opted to terminate the procedure rather than undertaken the left-sided sinus surgery.  This had been previously discussed with the patient as the possibility and rather than put her on increased risk for blood loss, we opted to defer the surgical procedure on the left-hand side.  Inferior turbinate reduction was then performed on the right inferior turbinate.  Significant polypoid disease was then resected using a straight microdebrider.  The mucosa of the turbinate was elevated and the turbinate bone was then resected with the microdebrider significantly shrinking the size of the inferior turbinate.  The turbinate was then outfractured to create a more patent nasal cavity.  The  patient nasal cavity was then thoroughly inspected.  Surgical debris was cleared.  There was some active bleeding along the posterior aspect of the dissection and this was cauterized with monopolar suction cautery.  The sinuses and nasopharynx were then cleared of any clotted material, and a 50:50 mixture of Kenalog 40 and Bactroban slurry  was instilled in the frontal ethmoid and maxillary sinus on the right-hand side.  A Baron sinus pack was placed in the common ethmoid cavity on the right.  The patient's nasopharynx was irrigated and suctioned.  An orogastric tube was passed.  Stomach contents were aspirated.  The patient was then awakened from anesthetic.  She was extubated and transferred from the operating room to recovery room in stable condition.  There were no complications and blood loss was 600 mL.          ______________________________ Kinnie Scalesavid L. Annalee GentaShoemaker, M.D.     DLS/MEDQ  D:  16/10/960404/16/2015  T:  09/14/2013  Job:  [id number]

## 2013-09-17 ENCOUNTER — Encounter (HOSPITAL_COMMUNITY): Payer: Self-pay | Admitting: Otolaryngology

## 2014-01-24 ENCOUNTER — Encounter (HOSPITAL_COMMUNITY): Payer: Self-pay | Admitting: Pharmacy Technician

## 2014-01-29 ENCOUNTER — Other Ambulatory Visit: Payer: Self-pay | Admitting: Otolaryngology

## 2014-01-29 NOTE — Pre-Procedure Instructions (Signed)
Julie Cardenas  01/29/2014   Your procedure is scheduled on:  Friday, September 4th  Report to [facility] Admitting at 0700 AM.  Call this number if you have problems the morning of surgery: [phone]   Remember:   Do not eat food or drink liquids after midnight.   Take these medicines the morning of surgery with A SIP OF WATER: flonase, inhalers, medrol   Do not wear jewelry, make-up or nail polish.  Do not wear lotions, powders, or perfumes. You may wear deodorant.  Do not shave 48 hours prior to surgery. Men may shave face and neck.  Do not bring valuables to the hospital.  [facility] is not responsible for any belongings or valuables.               Contacts, dentures or bridgework may not be worn into surgery.  Leave suitcase in the car. After surgery it may be brought to your room.  For patients admitted to the hospital, discharge time is determined by your treatment team.               Patients discharged the day of surgery will not be allowed to drive home.  Please read over the following fact sheets that you were given: Pain Booklet, Coughing and Deep Breathing and Surgical Site Infection Prevention [facility] - Preparing for Surgery  Before surgery, you can play an important role.  Because skin is not sterile, your skin needs to be as free of germs as possible.  You can reduce the number of germs on you skin by washing with CHG (chlorahexidine gluconate) soap before surgery.  CHG is an antiseptic cleaner which kills germs and bonds with the skin to continue killing germs even after washing.  Please DO NOT use if you have an allergy to CHG or antibacterial soaps.  If your skin becomes reddened/irritated stop using the CHG and inform your nurse when you arrive at Short Stay.  Do not shave (including legs and underarms) for at least 48 hours prior to the first CHG shower.  You may shave your face.  Please follow these instructions carefully:   1.  Shower with CHG  Soap the night before surgery and the morning of Surgery.  2.  If you choose to wash your hair, wash your hair first as usual with your normal shampoo.  3.  After you shampoo, rinse your hair and body thoroughly to remove the shampoo.  4.  Use CHG as you would any other liquid soap.  You can apply CHG directly to the skin and wash gently with scrungie or a clean washcloth.  5.  Apply the CHG Soap to your body ONLY FROM THE NECK DOWN.  Do not use on open wounds or open sores.  Avoid contact with your eyes, ears, mouth and genitals (private parts).  Wash genitals (private parts) with your normal soap.  6.  Wash thoroughly, paying special attention to the area where your surgery will be performed.  7.  Thoroughly rinse your body with warm water from the neck down.  8.  DO NOT shower/wash with your normal soap after using and rinsing off the CHG Soap.  9.  Pat yourself dry with a clean towel.            10.  Wear clean pajamas.            11.  Place clean sheets on your bed the night of your first shower  and do not sleep with pets.  Day of Surgery  Do not apply any lotions/deoderants the morning of surgery.  Please wear clean clothes to the hospital/surgery center.

## 2014-01-30 ENCOUNTER — Encounter (HOSPITAL_COMMUNITY): Payer: Self-pay

## 2014-01-30 ENCOUNTER — Encounter (HOSPITAL_COMMUNITY)
Admission: RE | Admit: 2014-01-30 | Discharge: 2014-01-30 | Disposition: A | Discharge status: Home / Self Care | Payer: Managed Care, Other (non HMO) | Source: Ambulatory Visit | Attending: Otolaryngology | Admitting: Otolaryngology

## 2014-01-30 ENCOUNTER — Encounter (HOSPITAL_COMMUNITY)
Admission: RE | Admit: 2014-01-30 | Discharge: 2014-01-30 | Disposition: A | Discharge status: Home / Self Care | Payer: Managed Care, Other (non HMO) | Source: Ambulatory Visit | Attending: Anesthesiology | Admitting: Anesthesiology

## 2014-01-30 DIAGNOSIS — Z01810 Encounter for preprocedural cardiovascular examination: Secondary | ICD-10-CM | POA: Diagnosis not present

## 2014-01-30 DIAGNOSIS — F411 Generalized anxiety disorder: Secondary | ICD-10-CM | POA: Diagnosis not present

## 2014-01-30 DIAGNOSIS — Z9889 Other specified postprocedural states: Secondary | ICD-10-CM | POA: Diagnosis not present

## 2014-01-30 DIAGNOSIS — J330 Polyp of nasal cavity: Secondary | ICD-10-CM | POA: Diagnosis present

## 2014-01-30 DIAGNOSIS — Z79899 Other long term (current) drug therapy: Secondary | ICD-10-CM | POA: Diagnosis not present

## 2014-01-30 DIAGNOSIS — J45909 Unspecified asthma, uncomplicated: Secondary | ICD-10-CM | POA: Diagnosis not present

## 2014-01-30 DIAGNOSIS — J329 Chronic sinusitis, unspecified: Secondary | ICD-10-CM | POA: Diagnosis not present

## 2014-01-30 DIAGNOSIS — Z01818 Encounter for other preprocedural examination: Secondary | ICD-10-CM | POA: Diagnosis not present

## 2014-01-30 DIAGNOSIS — IMO0002 Reserved for concepts with insufficient information to code with codable children: Secondary | ICD-10-CM | POA: Diagnosis not present

## 2014-01-30 DIAGNOSIS — Z01812 Encounter for preprocedural laboratory examination: Secondary | ICD-10-CM | POA: Diagnosis not present

## 2014-01-30 DIAGNOSIS — J40 Bronchitis, not specified as acute or chronic: Secondary | ICD-10-CM | POA: Diagnosis not present

## 2014-01-30 HISTORY — DX: Family history of other specified conditions: Z84.89

## 2014-01-30 HISTORY — DX: Shortness of breath: R06.02

## 2014-01-30 LAB — CBC
HCT: 37.9 % (ref 36.0–46.0)
Hemoglobin: 12.3 g/dL (ref 12.0–15.0)
MCH: 26.7 pg (ref 26.0–34.0)
MCHC: 32.5 g/dL (ref 30.0–36.0)
MCV: 82.4 fL (ref 78.0–100.0)
Platelets: 364 10*3/uL (ref 150–400)
RBC: 4.6 MIL/uL (ref 3.87–5.11)
RDW: 15.5 % (ref 11.5–15.5)
WBC: 6.9 10*3/uL (ref 4.0–10.5)

## 2014-01-30 LAB — BASIC METABOLIC PANEL
Anion gap: 11 (ref 5–15)
BUN: 9 mg/dL (ref 6–23)
CO2: 24 mEq/L (ref 19–32)
Calcium: 8.7 mg/dL (ref 8.4–10.5)
Chloride: 105 mEq/L (ref 96–112)
Creatinine, Ser: 0.83 mg/dL (ref 0.50–1.10)
GFR calc Af Amer: >90 mL/min (ref >90)
GFR calc non Af Amer: >90 mL/min (ref >90)
Glucose, Bld: 77 mg/dL (ref 70–99)
POTASSIUM: 4.1 meq/L (ref 3.7–5.3)
Sodium: 140 mEq/L (ref 137–147)

## 2014-01-30 LAB — HCG, SERUM, QUALITATIVE: PREG SERUM: NEGATIVE

## 2014-01-30 NOTE — Progress Notes (Signed)
Dr Thurmon Fair office made aware that orders need to be signed in Epic for patient's 0900 AM PAT appointment.

## 2014-02-01 ENCOUNTER — Encounter (HOSPITAL_COMMUNITY): Payer: Managed Care, Other (non HMO) | Admitting: Anesthesiology

## 2014-02-01 ENCOUNTER — Encounter (HOSPITAL_COMMUNITY): Payer: Self-pay | Admitting: *Deleted

## 2014-02-01 ENCOUNTER — Ambulatory Visit (HOSPITAL_COMMUNITY): Payer: Managed Care, Other (non HMO) | Admitting: Anesthesiology

## 2014-02-01 ENCOUNTER — Encounter (HOSPITAL_COMMUNITY)
Admission: RE | Disposition: A | Discharge status: Home / Self Care | Payer: Self-pay | Source: Ambulatory Visit | Attending: Otolaryngology

## 2014-02-01 ENCOUNTER — Ambulatory Visit (HOSPITAL_COMMUNITY)
Admission: RE | Admit: 2014-02-01 | Discharge: 2014-02-01 | Disposition: A | Discharge status: Home / Self Care | Payer: Managed Care, Other (non HMO) | Source: Ambulatory Visit | Attending: Otolaryngology | Admitting: Otolaryngology

## 2014-02-01 DIAGNOSIS — J324 Chronic pansinusitis: Secondary | ICD-10-CM

## 2014-02-01 DIAGNOSIS — F411 Generalized anxiety disorder: Secondary | ICD-10-CM | POA: Insufficient documentation

## 2014-02-01 DIAGNOSIS — J330 Polyp of nasal cavity: Secondary | ICD-10-CM | POA: Diagnosis not present

## 2014-02-01 DIAGNOSIS — Z01812 Encounter for preprocedural laboratory examination: Secondary | ICD-10-CM | POA: Insufficient documentation

## 2014-02-01 DIAGNOSIS — J45909 Unspecified asthma, uncomplicated: Secondary | ICD-10-CM | POA: Insufficient documentation

## 2014-02-01 DIAGNOSIS — J329 Chronic sinusitis, unspecified: Secondary | ICD-10-CM | POA: Insufficient documentation

## 2014-02-01 DIAGNOSIS — IMO0002 Reserved for concepts with insufficient information to code with codable children: Secondary | ICD-10-CM | POA: Insufficient documentation

## 2014-02-01 DIAGNOSIS — Z9889 Other specified postprocedural states: Secondary | ICD-10-CM | POA: Insufficient documentation

## 2014-02-01 DIAGNOSIS — J40 Bronchitis, not specified as acute or chronic: Secondary | ICD-10-CM | POA: Insufficient documentation

## 2014-02-01 DIAGNOSIS — Z01810 Encounter for preprocedural cardiovascular examination: Secondary | ICD-10-CM | POA: Insufficient documentation

## 2014-02-01 DIAGNOSIS — Z01818 Encounter for other preprocedural examination: Secondary | ICD-10-CM | POA: Insufficient documentation

## 2014-02-01 DIAGNOSIS — Z79899 Other long term (current) drug therapy: Secondary | ICD-10-CM | POA: Insufficient documentation

## 2014-02-01 HISTORY — PX: SINUS ENDO W/FUSION: SHX777

## 2014-02-01 SURGERY — SINUS SURGERY, ENDOSCOPIC, USING COMPUTER-ASSISTED NAVIGATION
Anesthesia: General | Site: Nose | Laterality: Left

## 2014-02-01 MED ORDER — DEXAMETHASONE SODIUM PHOSPHATE 10 MG/ML IJ SOLN
10.0000 mg | Freq: Once | INTRAMUSCULAR | Status: AC
  Administered 2014-02-01: 10 mg via INTRAVENOUS

## 2014-02-01 MED ORDER — TRIAMCINOLONE ACETONIDE 40 MG/ML IJ SUSP
INTRAMUSCULAR | Status: DC | PRN
  Administered 2014-02-01: 200 mg

## 2014-02-01 MED ORDER — MIDAZOLAM HCL 5 MG/5ML IJ SOLN
INTRAMUSCULAR | Status: DC | PRN
  Administered 2014-02-01: 2 mg via INTRAVENOUS

## 2014-02-01 MED ORDER — FENTANYL CITRATE 0.05 MG/ML IJ SOLN
INTRAMUSCULAR | Status: AC
  Filled 2014-02-01: qty 5

## 2014-02-01 MED ORDER — TRIAMCINOLONE ACETONIDE 40 MG/ML IJ SUSP
INTRAMUSCULAR | Status: AC
  Filled 2014-02-01: qty 5

## 2014-02-01 MED ORDER — MUPIROCIN CALCIUM 2 % EX CREA
TOPICAL_CREAM | CUTANEOUS | Status: AC
  Filled 2014-02-01: qty 15

## 2014-02-01 MED ORDER — ROCURONIUM BROMIDE 50 MG/5ML IV SOLN
INTRAVENOUS | Status: AC
  Filled 2014-02-01: qty 1

## 2014-02-01 MED ORDER — MIDAZOLAM HCL 2 MG/2ML IJ SOLN: 0.5000 mg | Freq: Once | INTRAMUSCULAR | Status: DC | PRN

## 2014-02-01 MED ORDER — ONDANSETRON HCL 4 MG/2ML IJ SOLN
INTRAMUSCULAR | Status: AC
  Filled 2014-02-01: qty 2

## 2014-02-01 MED ORDER — DIPHENHYDRAMINE HCL 50 MG/ML IJ SOLN
10.0000 mg | Freq: Once | INTRAMUSCULAR | Status: AC
  Administered 2014-02-01: 10 mg via INTRAVENOUS

## 2014-02-01 MED ORDER — DEXAMETHASONE SODIUM PHOSPHATE 10 MG/ML IJ SOLN
INTRAMUSCULAR | Status: AC
  Filled 2014-02-01: qty 1

## 2014-02-01 MED ORDER — SCOPOLAMINE 1 MG/3DAYS TD PT72
MEDICATED_PATCH | TRANSDERMAL | Status: AC
  Filled 2014-02-01: qty 1

## 2014-02-01 MED ORDER — DIPHENHYDRAMINE HCL 50 MG/ML IJ SOLN
INTRAMUSCULAR | Status: AC
  Filled 2014-02-01: qty 1

## 2014-02-01 MED ORDER — PROPOFOL 10 MG/ML IV BOLUS
INTRAVENOUS | Status: DC | PRN
  Administered 2014-02-01: 200 mg via INTRAVENOUS

## 2014-02-01 MED ORDER — OXYCODONE HCL 5 MG PO TABS
5.0000 mg | ORAL_TABLET | Freq: Once | ORAL | Status: AC | PRN
  Administered 2014-02-01: 5 mg via ORAL

## 2014-02-01 MED ORDER — GLYCOPYRROLATE 0.2 MG/ML IJ SOLN
INTRAMUSCULAR | Status: AC
  Filled 2014-02-01: qty 2

## 2014-02-01 MED ORDER — NEOSTIGMINE METHYLSULFATE 10 MG/10ML IV SOLN
INTRAVENOUS | Status: DC | PRN
  Administered 2014-02-01: 3 mg via INTRAVENOUS

## 2014-02-01 MED ORDER — OXYMETAZOLINE HCL 0.05 % NA SOLN
NASAL | Status: DC | PRN
  Administered 2014-02-01: 1

## 2014-02-01 MED ORDER — OXYCODONE HCL 5 MG PO TABS
ORAL_TABLET | ORAL | Status: AC
  Filled 2014-02-01: qty 1

## 2014-02-01 MED ORDER — PROPOFOL 10 MG/ML IV BOLUS
INTRAVENOUS | Status: AC
  Filled 2014-02-01: qty 20

## 2014-02-01 MED ORDER — SCOPOLAMINE 1 MG/3DAYS TD PT72
1.0000 | MEDICATED_PATCH | Freq: Once | TRANSDERMAL | Status: AC
  Administered 2014-02-01: 1 via TRANSDERMAL

## 2014-02-01 MED ORDER — AMOXICILLIN-POT CLAVULANATE 500-125 MG PO TABS: 1.0000 | ORAL_TABLET | Freq: Two times a day (BID) | ORAL | Status: DC

## 2014-02-01 MED ORDER — OXYCODONE HCL 5 MG/5ML PO SOLN: 5.0000 mg | Freq: Once | ORAL | Status: AC | PRN

## 2014-02-01 MED ORDER — OXYMETAZOLINE HCL 0.05 % NA SOLN
NASAL | Status: AC
  Filled 2014-02-01: qty 15

## 2014-02-01 MED ORDER — LIDOCAINE-EPINEPHRINE 1 %-1:100000 IJ SOLN
INTRAMUSCULAR | Status: AC
  Filled 2014-02-01: qty 1

## 2014-02-01 MED ORDER — LIDOCAINE-EPINEPHRINE 1 %-1:100000 IJ SOLN
INTRAMUSCULAR | Status: DC | PRN
  Administered 2014-02-01: 20 mL

## 2014-02-01 MED ORDER — GLYCOPYRROLATE 0.2 MG/ML IJ SOLN
INTRAMUSCULAR | Status: DC | PRN
  Administered 2014-02-01: 0.4 mg via INTRAVENOUS

## 2014-02-01 MED ORDER — FENTANYL CITRATE 0.05 MG/ML IJ SOLN
INTRAMUSCULAR | Status: DC | PRN
  Administered 2014-02-01 (×2): 50 ug via INTRAVENOUS
  Administered 2014-02-01: 100 ug via INTRAVENOUS
  Administered 2014-02-01 (×3): 50 ug via INTRAVENOUS

## 2014-02-01 MED ORDER — MIDAZOLAM HCL 2 MG/2ML IJ SOLN
INTRAMUSCULAR | Status: AC
  Filled 2014-02-01: qty 2

## 2014-02-01 MED ORDER — MEPERIDINE HCL 25 MG/ML IJ SOLN: 6.2500 mg | INTRAMUSCULAR | Status: DC | PRN

## 2014-02-01 MED ORDER — 0.9 % SODIUM CHLORIDE (POUR BTL) OPTIME
TOPICAL | Status: DC | PRN
  Administered 2014-02-01: 1000 mL

## 2014-02-01 MED ORDER — LACTATED RINGERS IV SOLN
INTRAVENOUS | Status: DC | PRN
  Administered 2014-02-01 (×2): via INTRAVENOUS

## 2014-02-01 MED ORDER — LIDOCAINE HCL (CARDIAC) 20 MG/ML IV SOLN
INTRAVENOUS | Status: DC | PRN
  Administered 2014-02-01: 30 mg via INTRAVENOUS

## 2014-02-01 MED ORDER — NEOSTIGMINE METHYLSULFATE 10 MG/10ML IV SOLN
INTRAVENOUS | Status: AC
  Filled 2014-02-01: qty 1

## 2014-02-01 MED ORDER — ROCURONIUM BROMIDE 100 MG/10ML IV SOLN
INTRAVENOUS | Status: DC | PRN
  Administered 2014-02-01: 30 mg via INTRAVENOUS

## 2014-02-01 MED ORDER — HYDROMORPHONE HCL PF 1 MG/ML IJ SOLN
INTRAMUSCULAR | Status: AC
  Filled 2014-02-01: qty 1

## 2014-02-01 MED ORDER — PROMETHAZINE HCL 25 MG/ML IJ SOLN: 6.2500 mg | INTRAMUSCULAR | Status: DC | PRN

## 2014-02-01 MED ORDER — HYDROCODONE-ACETAMINOPHEN 5-325 MG PO TABS: 1.0000 | ORAL_TABLET | Freq: Four times a day (QID) | ORAL | Status: DC | PRN

## 2014-02-01 MED ORDER — HYDROMORPHONE HCL PF 1 MG/ML IJ SOLN
0.2500 mg | INTRAMUSCULAR | Status: DC | PRN
  Administered 2014-02-01: 0.5 mg via INTRAVENOUS

## 2014-02-01 MED ORDER — ONDANSETRON HCL 4 MG/2ML IJ SOLN
INTRAMUSCULAR | Status: DC | PRN
  Administered 2014-02-01: 4 mg via INTRAVENOUS

## 2014-02-01 MED ORDER — LACTATED RINGERS IV SOLN
INTRAVENOUS | Status: DC
  Administered 2014-02-01: 08:00:00 via INTRAVENOUS

## 2014-02-01 MED ORDER — CEFAZOLIN SODIUM-DEXTROSE 2-3 GM-% IV SOLR
2000.0000 mg | Freq: Once | INTRAVENOUS | Status: AC
  Administered 2014-02-01: 2 g via INTRAVENOUS

## 2014-02-01 MED ORDER — MUPIROCIN CALCIUM 2 % EX CREA
TOPICAL_CREAM | CUTANEOUS | Status: DC | PRN
  Administered 2014-02-01: 1 via TOPICAL

## 2014-02-01 MED ORDER — EPHEDRINE SULFATE 50 MG/ML IJ SOLN
INTRAMUSCULAR | Status: DC | PRN
  Administered 2014-02-01 (×2): 5 mg via INTRAVENOUS

## 2014-02-01 MED ORDER — SODIUM CHLORIDE 0.9 % IR SOLN
Status: DC | PRN
  Administered 2014-02-01: 1000 mL

## 2014-02-01 MED ORDER — ARTIFICIAL TEARS OP OINT
TOPICAL_OINTMENT | OPHTHALMIC | Status: AC
  Filled 2014-02-01: qty 3.5

## 2014-02-01 MED ORDER — LIDOCAINE HCL 4 % MT SOLN
OROMUCOSAL | Status: DC | PRN
  Administered 2014-02-01: 4 mL via TOPICAL

## 2014-02-01 SURGICAL SUPPLY — 45 items
ATTRACTOMAT 16X20 MAGNETIC DRP (DRAPES) IMPLANT
BLADE ROTATE RAD 40 4 M4 (BLADE) ×1 IMPLANT
BLADE ROTATE RAD 40 4MM M4 (BLADE) ×1
BLADE ROTATE TRICUT 4MX13CM M4 (BLADE) ×1
BLADE ROTATE TRICUT 4X13 M4 (BLADE) ×2 IMPLANT
CANISTER SUCTION 2500CC (MISCELLANEOUS) ×6 IMPLANT
COAGULATOR SUCT 6 FR SWTCH (ELECTROSURGICAL)
COAGULATOR SUCT SWTCH 10FR 6 (ELECTROSURGICAL) IMPLANT
DRESSING NASAL KENNEDY 3.5X.9 (MISCELLANEOUS) IMPLANT
DRSG NASAL KENNEDY 3.5X.9 (MISCELLANEOUS)
ELECT COATED BLADE 2.86 ST (ELECTRODE) IMPLANT
ELECT REM PT RETURN 9FT ADLT (ELECTROSURGICAL) ×3
ELECTRODE REM PT RTRN 9FT ADLT (ELECTROSURGICAL) ×1 IMPLANT
FILTER ARTHROSCOPY CONVERTOR (FILTER) ×4 IMPLANT
GLOVE BIO SURGEON STRL SZ7.5 (GLOVE) ×2 IMPLANT
GLOVE BIOGEL M 7.0 STRL (GLOVE) ×6 IMPLANT
GOWN STRL REUS W/ TWL LRG LVL3 (GOWN DISPOSABLE) ×1 IMPLANT
GOWN STRL REUS W/TWL LRG LVL3 (GOWN DISPOSABLE) ×6
KIT BASIN OR (CUSTOM PROCEDURE TRAY) ×3 IMPLANT
KIT ROOM TURNOVER OR (KITS) ×3 IMPLANT
NDL 18GX1X1/2 (RX/OR ONLY) (NEEDLE) IMPLANT
NEEDLE 18GX1X1/2 (RX/OR ONLY) (NEEDLE) ×3 IMPLANT
NS IRRIG 1000ML POUR BTL (IV SOLUTION) ×3 IMPLANT
PAD ARMBOARD 7.5X6 YLW CONV (MISCELLANEOUS) ×6 IMPLANT
PAD ENT ADHESIVE 25PK (MISCELLANEOUS) ×3 IMPLANT
PENCIL BUTTON HOLSTER BLD 10FT (ELECTRODE) IMPLANT
SPECIMEN JAR SMALL (MISCELLANEOUS) ×1 IMPLANT
SPONGE NEURO XRAY DETECT 1X3 (DISPOSABLE) ×3 IMPLANT
SUT ETHILON 3 0 FSL (SUTURE) IMPLANT
SUT PLAIN 4 0 ~~LOC~~ 1 (SUTURE) IMPLANT
SWAB COLLECTION DEVICE MRSA (MISCELLANEOUS) IMPLANT
SYR CONTROL 10ML LL (SYRINGE) ×2 IMPLANT
SYRINGE 10CC LL (SYRINGE) ×2 IMPLANT
TOWEL OR 17X24 6PK STRL BLUE (TOWEL DISPOSABLE) ×3 IMPLANT
TOWEL OR 17X26 10 PK STRL BLUE (TOWEL DISPOSABLE) ×3 IMPLANT
TRACKER ENT INSTRUMENT (MISCELLANEOUS) ×3 IMPLANT
TRACKER ENT PATIENT (MISCELLANEOUS) ×3 IMPLANT
TRAP SPECIMEN MUCOUS 40CC (MISCELLANEOUS) IMPLANT
TRAY ENT MC OR (CUSTOM PROCEDURE TRAY) ×3 IMPLANT
TUBE ANAEROBIC SPECIMEN COL (MISCELLANEOUS) IMPLANT
TUBE CONNECTING 12'X1/4 (SUCTIONS) ×1
TUBE CONNECTING 12X1/4 (SUCTIONS) ×2 IMPLANT
TUBING EXTENTION W/L.L. (IV SETS) ×3 IMPLANT
TUBING STRAIGHTSHOT EPS 5PK (TUBING) ×3 IMPLANT
WIPE INSTRUMENT VISIWIPE 73X73 (MISCELLANEOUS) ×3 IMPLANT

## 2014-02-01 NOTE — Anesthesia Preprocedure Evaluation (Signed)
Anesthesia Evaluation  Patient identified by MRN, date of birth, ID band Patient awake    Reviewed: Allergy & Precautions, H&P , NPO status , Patient's Chart, lab work & pertinent test results  History of Anesthesia Complications Negative for: history of anesthetic complications  Airway Mallampati: I TM Distance: >3 FB Neck ROM: Full    Dental  (+) Dental Advisory Given   Pulmonary asthma (last inhaler use a month ago) ,  breath sounds clear to auscultation        Cardiovascular negative cardio ROS  Rhythm:Regular Rate:Normal     Neuro/Psych  Headaches,    GI/Hepatic negative GI ROS, Neg liver ROS,   Endo/Other  Morbid obesity  Renal/GU negative Renal ROS     Musculoskeletal   Abdominal   Peds  Hematology negative hematology ROS (+)   Anesthesia Other Findings   Reproductive/Obstetrics 01/30/14 preg test NEG                           Anesthesia Physical Anesthesia Plan  ASA: II  Anesthesia Plan: General   Post-op Pain Management:    Induction: Intravenous  Airway Management Planned: Oral ETT  Additional Equipment:   Intra-op Plan:   Post-operative Plan: Extubation in OR  Informed Consent: I have reviewed the patients History and Physical, chart, labs and discussed the procedure including the risks, benefits and alternatives for the proposed anesthesia with the patient or authorized representative who has indicated his/her understanding and acceptance.   Dental advisory given  Plan Discussed with: CRNA and Surgeon  Anesthesia Plan Comments: (Plan routine monitors, GETA)        Anesthesia Quick Evaluation

## 2014-02-01 NOTE — H&P (Signed)
Julie Cardenas is an 28 y.o. female.   Chief Complaint: Chronic nasal Polyposis HPI: Nasal airway obstruction, chronic polyps, s/p Rt Ess  Past Medical History  Diagnosis Date  . Seasonal allergies   . Asthma   . Bronchitis   . Anxiety     no medicine, prayers aid her   . Headache(784.0)     sinus related  . Leg cramps     PCP- advised her to exercise   . Family history of anesthesia complication     mother slow waking up  . Shortness of breath     Past Surgical History  Procedure Laterality Date  . Sinus endo w/fusion Right 09/13/2013    Procedure: RIGHT ENDOSCOPIC SINUS SURGERY WITH FUSION SCAN;  Surgeon: [name], MD;  Location: [facility] OR;  Service: ENT;  Laterality: Right;    Family History  Problem Relation Age of Onset  . Diabetes Mother   . Cancer Mother   . Hypertension Mother    Social History:  reports that she has never smoked. She has never used smokeless tobacco. She reports that she drinks alcohol. She reports that she does not use illicit drugs.  Allergies: No Known Allergies  Medications Prior to Admission  Medication Sig Dispense Refill  . albuterol (PROVENTIL HFA;VENTOLIN HFA) 108 (90 BASE) MCG/ACT inhaler Inhale 2 puffs into the lungs every 6 (six) hours as needed for wheezing or shortness of breath.       . fluticasone (FLONASE) 50 MCG/ACT nasal spray Place 2 sprays into both nostrils daily.      . methylPREDNISolone (MEDROL DOSEPAK) 4 MG tablet Take by mouth. follow package directions      . methylPREDNISolone (MEDROL) 4 MG tablet Take 4 mg by mouth daily.      . Multiple Vitamins-Minerals (ONE-A-DAY WOMENS VITACRAVES) CHEW Chew 2 tablets by mouth daily.      Marland Kitchen triamcinolone ointment (KENALOG) 0.1 % Apply 1 application topically daily as needed.       . Fluticasone Furoate-Vilanterol (BREO ELLIPTA) 100-25 MCG/INH AEPB Inhale 1 puff into the lungs daily.      Marland Kitchen ipratropium (ATROVENT HFA) 17 MCG/ACT inhaler Inhale 2 puffs into the lungs every 6 (six)  hours.        Results for orders placed during the hospital encounter of 01/30/14 (from the past 48 hour(s))  BASIC METABOLIC PANEL     Status: None   Collection Time    01/30/14  9:15 AM      Result Value Ref Range   Sodium 140  137 - 147 mEq/L   Potassium 4.1  3.7 - 5.3 mEq/L   Chloride 105  96 - 112 mEq/L   CO2 24  19 - 32 mEq/L   Glucose, Bld 77  70 - 99 mg/dL   BUN 9  6 - 23 mg/dL   Creatinine, Ser 0.83  0.50 - 1.10 mg/dL   Calcium 8.7  8.4 - 10.5 mg/dL   GFR calc non Af Amer >90  >90 mL/min   GFR calc Af Amer >90  >90 mL/min   Comment: (NOTE)     The eGFR has been calculated using the CKD EPI equation.     This calculation has not been validated in all clinical situations.     eGFR's persistently <90 mL/min signify possible Chronic Kidney     Disease.   Anion gap 11  5 - 15  CBC     Status: None   Collection Time  01/30/14  9:15 AM      Result Value Ref Range   WBC 6.9  4.0 - 10.5 K/uL   RBC 4.60  3.87 - 5.11 MIL/uL   Hemoglobin 12.3  12.0 - 15.0 g/dL   HCT 37.9  36.0 - 46.0 %   MCV 82.4  78.0 - 100.0 fL   MCH 26.7  26.0 - 34.0 pg   MCHC 32.5  30.0 - 36.0 g/dL   RDW 15.5  11.5 - 15.5 %   Platelets 364  150 - 400 K/uL  HCG, SERUM, QUALITATIVE     Status: None   Collection Time    01/30/14  9:15 AM      Result Value Ref Range   Preg, Serum NEGATIVE  NEGATIVE   Comment:            THE SENSITIVITY OF THIS     METHODOLOGY IS >10 mIU/mL.   Dg Chest 2 View  01/30/2014   CLINICAL DATA:  Preop for endoscopic sinus surgery  EXAM: CHEST  2 VIEW  COMPARISON:  10/04/2012  FINDINGS: Cardiomediastinal silhouette is stable. No acute infiltrate or pleural effusion. No pulmonary edema. Bony thorax is unremarkable.  IMPRESSION: No active cardiopulmonary disease.   Electronically Signed   By: Lahoma Crocker M.D.   On: 01/30/2014 09:29    Review of Systems  Constitutional: Negative.   Respiratory: Negative.   Cardiovascular: Negative.   Gastrointestinal: Negative.    Genitourinary: Negative.     Blood pressure 114/66, pulse 81, temperature 97.9 F (36.6 C), temperature source Oral, resp. rate 20, weight 105.688 kg (233 lb), SpO2 99.00%. Physical Exam  Constitutional: She is oriented to person, place, and time. She appears well-developed.  HENT:  Left nasal polyposis  Neck: Normal range of motion. Neck supple.  Cardiovascular: Normal rate.   Respiratory: Effort normal.  GI: Soft.  Musculoskeletal: Normal range of motion.  Neurological: She is alert and oriented to person, place, and time.     Assessment/Plan Adm for OP Left ESS and polypectomy  [name] 02/01/2014, 8:34 AM

## 2014-02-01 NOTE — Op Note (Signed)
NAME:  Julie Cardenas, Julie Cardenas NO.:  [id number]  MEDICAL RECORD NO.:  [id number]  LOCATION:  [facility]                         FACILITY:  [facility]  PHYSICIAN:  Kinnie Scales. Annalee Genta, M.D.DATE OF BIRTH:  15-Jan-1986  DATE OF PROCEDURE:  02/01/2014 DATE OF DISCHARGE:  02/01/2014                              OPERATIVE REPORT   PREOPERATIVE DIAGNOSIS: 1. Chronic sinusitis with nasal polyposis. 2. Status post previous right endoscopic sinus surgery with nasal     polypectomy.  POSTOPERATIVE DIAGNOSIS: 1. Chronic sinusitis with nasal polyposis. 2. Status post previous right endoscopic sinus surgery with nasal     polypectomy.  INDICATION FOR SURGERY: 1. Chronic sinusitis with nasal polyposis. 2. Status post previous right endoscopic sinus surgery with nasal     polypectomy.  SURGICAL PROCEDURE: 1. Left endoscopic sinus surgery consisting of left total     ethmoidectomy, left maxillary antrostomy with removal of diseased     tissue, left nasal frontal recess exploration, and left     sphenoidotomy with removal of diseased tissue. 2. Endoscopic debridement, left nasal cavity. 3. Intraoperative computer-assisted navigation (fusion).  ANESTHESIA:  General endotracheal.  SURGEON:  Kinnie Scales. Annalee Genta, MD  COMPLICATIONS:  None.  ESTIMATED BLOOD LOSS:  400 mL.  Patient transferred from the operating room to recovery room in stable condition.  FINDINGS:  Extensive nasal polyposis with near complete left-sided nasal airway obstruction with polypoid disease involving oral four left-sided sinuses as well as the nasal passageway.  Polypoid sinus disease cleared and the sinus ostia widely patent. At the conclusion of the surgical procedure, there was no packing placed.  A 50:50 mixture of Kenalog 40 and Bactroban ointment was instilled in the left sinus passageways.  BRIEF HISTORY:  The patient is a 28 year old, black female, who has been followed in our office with a history of  chronic progressive nasal polyposis.  She has a history of chronic sinusitis, nasal airway obstruction discharge and cough.  She underwent previous right-sided endoscopic sinus surgery.  The procedures were performed on a staged basis because of the patient's extensive nasal polyposis and moderate intraoperative blood loss.  Her previous procedure was performed approximately 4 months ago.  She did well and continued to have progressive symptoms of left-sided sinusitis and airway obstruction. Given the patient's history, findings, and previous CT scan, I recommended that we undertake completion endoscopic sinus surgery consisting of left endoscopic sinus surgery with nasal polypectomy.  The risks and benefits of this procedure were discussed in detail with the patient and her family and they understood and concurred with our plan for surgery which is scheduled under general anesthesia as an outpatient.  SURGICAL PROCEDURE:  Patient was brought to the operating room on February 01, 2014, and placed in supine position on the operating table. A general endotracheal anesthesia was established without difficulty. When the patient was adequately anesthetized, she was positioned on the operating table and prepped and draped in a sterile fashion.  Her nose was injected with 5 mL of 1% lidocaine 1:100,000 solution of epinephrine which injected in submucosal fashion along the left nasal passageway, intranasal polyps and via transoral sphenopalatine block.  Her nose was then packed with  Afrin-soaked cottonoid pledgets bilaterally.  The Xomed navigation headgear was applied.  Anatomic and surgical landmarks were identified and confirmed and the Xomed fusion device was used throughout the surgical procedure.  With the patient prepped and draped in position, surgical procedure was begun by examining the right nasal passageway which was the previous surgical site.  The sinus ostia widely patent.   There was some thick mucoid discharge, but no evidence of significant recurrent polyps or infection.  Packing was removed from the left side and using a 0-degree endoscope, intranasal polypoid debridement was undertaken using a straight microdebrider.  Extensive polypoid disease filling the entire left nasal passageway was then resected to the level of the middle meatus, this included polyp tissue within the nasopharynx as well as along the superior meatus adjacent to the middle turbinate.  When the polyp disease had been completely debrided, the endoscopic sinus component of the case was begun using a 0-degree telescope, straight microdebrider, and the fusion navigation tool, total ethmoidectomy was performed.  The uncinate process was resected and extensive polypoid disease within the ethmoid cavity was resected from anterior to posterior.  The posterior superior component of the ethmoid sinus was identified and using a curved microdebrider and 45 degree telescope, dissection was carried out from posterior to anterior along the roof of the ethmoid sinus removing polypoid disease and creating a widely patent ethmoid sinus cavity. Attention was then turned to the lateral nasal wall and using a curved microdebrider and through cutting forceps, the natural ostium of the maxillary sinus was identified, this was completely occluded with polypoid disease within the maxillary sinus and this entire area was debrided creating a widely patent left maxillary antrostomy and removal of all polypoid material within the maxillary sinus.  Attention was then turned to the nasal frontal recess.  Again, using a 45 degree telescope and a curved microdebrider, the frontal sinus was carefully debrided. The patient had a large suprabullar ethmoid cell which was filled with polypoid disease, this was dissected.  The intervening bony tissue was resected, creating access to the natural ostium of the frontal  sinus which again was filled with polypoid material and debrided.  The patient's sphenoid sinus was then inspected using a 0-degree telescope and the navigation tool, the natural ostium was identified.  There was significant polypoid disease filling the entire left sphenoid sinus and this was resected.  The natural ostium was enlarged in an inferior and lateral direction to create a widely patent sphenoid ostium and was communicated with the posterior ethmoid region as well.  Sinus was cleared of all polypoid disease.  The patient's nasal cavity was then thoroughly inspected.  All surgical debris was cleared.  There was minimal bleeding.  Sinuses were irrigated.  A 50:50 mixture of Kenalog 40 and Bactroban cream were then instilled in the maxillary ethmoid and frontal sinuses.  No packing was placed.  The patient's oral cavity and oropharynx was suctioned and irrigated, and orogastric tube was passed.  Stomach contents were aspirated.  The patient was awakened from anesthetic, was then extubated and was transferred from the operating room to the recovery room in stable condition.  No complications and blood loss was approximately 400 mL.          ______________________________ Kinnie Scales. Annalee Genta, M.D.     DLS/MEDQ  D:  16/03/9603  T:  02/01/2014  Job:  [id number]

## 2014-02-01 NOTE — Brief Op Note (Signed)
02/01/2014  10:36 AM  PATIENT:  Julie Cardenas  28 y.o. female  PRE-OPERATIVE DIAGNOSIS:  NASAL CAVITY POLYPS/CHRONIC SINUSITIS  POST-OPERATIVE DIAGNOSIS:  NASAL CAVITY POLYPS/CHRONIC SINUSITIS  PROCEDURE:  Procedure(s): LEFT ENDOSCOPIC SINUS SURGERY/NASAL POLYPECTOMY WITH FUSION (Left)  SURGEON:  Surgeon(s) and Role:    * Osborn Coho, MD - Primary  PHYSICIAN ASSISTANT:   ASSISTANTS: none   ANESTHESIA:   general  EBL:  Total I/O In: 1000 [I.V.:1000] Out: 400 [Blood:400]  BLOOD ADMINISTERED:none  DRAINS: none   LOCAL MEDICATIONS USED:  LIDOCAINE  and Amount: 5 ml  SPECIMEN:  Source of Specimen:  Left sinus contents  DISPOSITION OF SPECIMEN:  PATHOLOGY  COUNTS:  YES  TOURNIQUET:  * No tourniquets in log *  DICTATION: .Other Dictation: Dictation Number [id number]  PLAN OF CARE: Discharge to home after PACU  PATIENT DISPOSITION:  PACU - hemodynamically stable.   Delay start of Pharmacological VTE agent (>24hrs) due to surgical blood loss or risk of bleeding: not applicable

## 2014-02-01 NOTE — Discharge Instructions (Signed)

## 2014-02-01 NOTE — Anesthesia Postprocedure Evaluation (Signed)
  Anesthesia Post-op Note  Patient: Julie Cardenas  Procedure(s) Performed: Procedure(s): LEFT ENDOSCOPIC SINUS SURGERY/NASAL POLYPECTOMY WITH FUSION (Left)  Patient Location: PACU  Anesthesia Type:General  Level of Consciousness: awake, alert , oriented and patient cooperative  Airway and Oxygen Therapy: Patient Spontanous Breathing  Post-op Pain: none  Post-op Assessment: Post-op Vital signs reviewed, Patient's Cardiovascular Status Stable, Respiratory Function Stable, Patent Airway, No signs of Nausea or vomiting and Pain level controlled  Post-op Vital Signs: Reviewed and stable  Last Vitals:  Filed Vitals:   02/01/14 1153  BP: 127/60  Pulse: 78  Temp:   Resp: 15    Complications: No apparent anesthesia complications

## 2014-02-01 NOTE — Transfer of Care (Signed)
Immediate Anesthesia Transfer of Care Note  Patient: Julie Cardenas  Procedure(s) Performed: Procedure(s): LEFT ENDOSCOPIC SINUS SURGERY/NASAL POLYPECTOMY WITH FUSION (Left)  Patient Location: PACU  Anesthesia Type:General  Level of Consciousness: awake, alert  and oriented  Airway & Oxygen Therapy: Patient Spontanous Breathing and Patient connected to face mask oxygen  Post-op Assessment: Report given to PACU RN  Post vital signs: Reviewed and stable  Complications: No apparent anesthesia complications

## 2014-02-05 ENCOUNTER — Encounter (HOSPITAL_COMMUNITY): Payer: Self-pay | Admitting: Otolaryngology

## 2014-03-04 ENCOUNTER — Encounter (HOSPITAL_COMMUNITY): Payer: Self-pay | Admitting: Emergency Medicine

## 2014-03-04 ENCOUNTER — Emergency Department (INDEPENDENT_AMBULATORY_CARE_PROVIDER_SITE_OTHER)
Admission: EM | Admit: 2014-03-04 | Discharge: 2014-03-04 | Disposition: A | Discharge status: Home / Self Care | Payer: Managed Care, Other (non HMO) | Source: Home / Self Care | Attending: Family Medicine | Admitting: Family Medicine

## 2014-03-04 DIAGNOSIS — L259 Unspecified contact dermatitis, unspecified cause: Secondary | ICD-10-CM

## 2014-03-04 DIAGNOSIS — R252 Cramp and spasm: Secondary | ICD-10-CM

## 2014-03-04 LAB — POCT URINALYSIS DIP (DEVICE)
Bilirubin Urine: NEGATIVE
Glucose, UA: NEGATIVE mg/dL
Hgb urine dipstick: NEGATIVE
KETONES UR: NEGATIVE mg/dL
LEUKOCYTES UA: NEGATIVE
Nitrite: NEGATIVE
PH: 5 (ref 5.0–8.0)
PROTEIN: NEGATIVE mg/dL
Specific Gravity, Urine: 1.025 (ref 1.005–1.030)
Urobilinogen, UA: 0.2 mg/dL (ref 0.0–1.0)

## 2014-03-04 LAB — CBC
HCT: 36.6 % (ref 36.0–46.0)
Hemoglobin: 11.8 g/dL — ABNORMAL LOW (ref 12.0–15.0)
MCH: 26 pg (ref 26.0–34.0)
MCHC: 32.2 g/dL (ref 30.0–36.0)
MCV: 80.8 fL (ref 78.0–100.0)
PLATELETS: 374 10*3/uL (ref 150–400)
RBC: 4.53 MIL/uL (ref 3.87–5.11)
RDW: 15.5 % (ref 11.5–15.5)
WBC: 6.4 10*3/uL (ref 4.0–10.5)

## 2014-03-04 LAB — POCT I-STAT, CHEM 8
CALCIUM ION: 1.18 mmol/L (ref 1.12–1.23)
CHLORIDE: 106 meq/L (ref 96–112)
Creatinine, Ser: 0.7 mg/dL (ref 0.50–1.10)
Glucose, Bld: 110 mg/dL — ABNORMAL HIGH (ref 70–99)
HEMATOCRIT: 42 % (ref 36.0–46.0)
Hemoglobin: 14.3 g/dL (ref 12.0–15.0)
Potassium: 4 mEq/L (ref 3.7–5.3)
Sodium: 139 mEq/L (ref 137–147)
TCO2: 20 mmol/L (ref 0–100)

## 2014-03-04 LAB — CK: Total CK: 156 U/L (ref 7–177)

## 2014-03-04 LAB — POCT PREGNANCY, URINE: Preg Test, Ur: NEGATIVE

## 2014-03-04 MED ORDER — TRIAMCINOLONE ACETONIDE 0.1 % EX CREA: 1.0000 "application " | TOPICAL_CREAM | Freq: Two times a day (BID) | CUTANEOUS | Status: DC

## 2014-03-04 MED ORDER — PREDNISONE 5 MG PO KIT: PACK | ORAL | Status: DC

## 2014-03-04 NOTE — ED Provider Notes (Signed)
Julie Cardenas is a 28 y.o. female who presents to Urgent Care today for rash and myalgias.  Patient has had a rash across her body present for about one week. She initially notes the rash started on her right arm but has spread across her body is involved the trunk and all 4 extremities. The rash is mildly itchy. She relates the rash to been exposed to an acrylic crafting supply about a week ago. She denies any fevers or chills nausea vomiting or diarrhea. However she does note leg cramping starting 3 or 4 days ago. Patient has had a history of cramping but notes that he has been worsened recently. She denies any new medications nausea vomiting diarrhea fevers or chills. No new symptoms urgent shampoos et Ronney Asters. She took Benadryl and hydrocodone which helped a little. Recently she had a sinus surgery about a month ago but denies any new medical issues other than that.   Past Medical History  Diagnosis Date  . Seasonal allergies   . Asthma   . Bronchitis   . Anxiety     no medicine, prayers aid her   . Headache(784.0)     sinus related  . Leg cramps     PCP- advised her to exercise   . Family history of anesthesia complication     mother slow waking up  . Shortness of breath    History  Substance Use Topics  . Smoking status: Never Smoker   . Smokeless tobacco: Never Used  . Alcohol Use: Yes     Comment: occas.   ROS as above Medications: No current facility-administered medications for this encounter.   Current Outpatient Prescriptions  Medication Sig Dispense Refill  . albuterol (PROVENTIL HFA;VENTOLIN HFA) 108 (90 BASE) MCG/ACT inhaler Inhale 2 puffs into the lungs every 6 (six) hours as needed for wheezing or shortness of breath.       . Fluticasone Furoate-Vilanterol (BREO ELLIPTA) 100-25 MCG/INH AEPB Inhale 1 puff into the lungs daily.      Marland Kitchen HYDROcodone-acetaminophen (NORCO) 5-325 MG per tablet Take 1-2 tablets by mouth every 6 (six) hours as needed.  30 tablet  0  .  Multiple Vitamins-Minerals (ONE-A-DAY WOMENS VITACRAVES) CHEW Chew 2 tablets by mouth daily.      . PredniSONE 5 MG KIT 12 day dosepack po  1 kit  0  . triamcinolone cream (KENALOG) 0.1 % Apply 1 application topically 2 (two) times daily.  30 g  1  . [DISCONTINUED] ipratropium (ATROVENT HFA) 17 MCG/ACT inhaler Inhale 2 puffs into the lungs every 6 (six) hours.        Exam:  BP 108/72  Pulse 100  Temp(Src) 97.1 F (36.2 C) (Oral)  SpO2 97% Gen: Well NAD nontoxic appearing HEENT: EOMI,  MMM Lungs: Normal work of breathing. CTABL Heart: RRR no MRG Abd: NABS, Soft. Nondistended, Nontender Exts: Brisk capillary refill, warm and well perfused.  Skin: Natale Milch mildly erythematous macular rash on right arm and both ankles. Nontender. Blanchable. MSK: Normal-appearing test bilaterally. Calf circumference is equal. Not particularly tender bilaterally.  Results for orders placed during the hospital encounter of 03/04/14 (from the past 24 hour(s))  CBC     Status: Abnormal   Collection Time    03/04/14  8:45 AM      Result Value Ref Range   WBC 6.4  4.0 - 10.5 K/uL   RBC 4.53  3.87 - 5.11 MIL/uL   Hemoglobin 11.8 (*) 12.0 - 15.0 g/dL   HCT  36.6  36.0 - 46.0 %   MCV 80.8  78.0 - 100.0 fL   MCH 26.0  26.0 - 34.0 pg   MCHC 32.2  30.0 - 36.0 g/dL   RDW 15.5  11.5 - 15.5 %   Platelets 374  150 - 400 K/uL  CK     Status: None   Collection Time    03/04/14  8:45 AM      Result Value Ref Range   Total CK 156  7 - 177 U/L  POCT URINALYSIS DIP (DEVICE)     Status: None   Collection Time    03/04/14  8:50 AM      Result Value Ref Range   Glucose, UA NEGATIVE  NEGATIVE mg/dL   Bilirubin Urine NEGATIVE  NEGATIVE   Ketones, ur NEGATIVE  NEGATIVE mg/dL   Specific Gravity, Urine 1.025  1.005 - 1.030   Hgb urine dipstick NEGATIVE  NEGATIVE   pH 5.0  5.0 - 8.0   Protein, ur NEGATIVE  NEGATIVE mg/dL   Urobilinogen, UA 0.2  0.0 - 1.0 mg/dL   Nitrite NEGATIVE  NEGATIVE   Leukocytes, UA NEGATIVE   NEGATIVE  POCT PREGNANCY, URINE     Status: None   Collection Time    03/04/14  8:53 AM      Result Value Ref Range   Preg Test, Ur NEGATIVE  NEGATIVE  POCT I-STAT, CHEM 8     Status: Abnormal   Collection Time    03/04/14  9:03 AM      Result Value Ref Range   Sodium 139  137 - 147 mEq/L   Potassium 4.0  3.7 - 5.3 mEq/L   Chloride 106  96 - 112 mEq/L   BUN <3 (*) 6 - 23 mg/dL   Creatinine, Ser 0.70  0.50 - 1.10 mg/dL   Glucose, Bld 110 (*) 70 - 99 mg/dL   Calcium, Ion 1.18  1.12 - 1.23 mmol/L   TCO2 20  0 - 100 mmol/L   Hemoglobin 14.3  12.0 - 15.0 g/dL   HCT 42.0  36.0 - 46.0 %   No results found.  Assessment and Plan: 28 y.o. female with contact dermatitis and cramping. 1) contact dermatitis: Treat with prednisone and triamcinolone cream 2) cramping: Likely continuation of chronic issue. Followup with PCP. Labs within normal limits.  Discussed warning signs or symptoms. Please see discharge instructions. Patient expresses understanding.     Gregor Hams, MD 03/04/14 [phone]

## 2014-03-04 NOTE — Discharge Instructions (Signed)
Thank you for coming in today. Take prednisone as directed for 12 days. Use the cream as needed. Contact Dermatitis Contact dermatitis is a reaction to certain substances that touch the skin. Contact dermatitis can be either irritant contact dermatitis or allergic contact dermatitis. Irritant contact dermatitis does not require previous exposure to the substance for a reaction to occur.Allergic contact dermatitis only occurs if you have been exposed to the substance before. Upon a repeat exposure, your body reacts to the substance.  CAUSES  Many substances can cause contact dermatitis. Irritant dermatitis is most commonly caused by repeated exposure to mildly irritating substances, such as:  Makeup.  Soaps.  Detergents.  Bleaches.  Acids.  Metal salts, such as nickel. Allergic contact dermatitis is most commonly caused by exposure to:  Poisonous plants.  Chemicals (deodorants, shampoos).  Jewelry.  Latex.  Neomycin in triple antibiotic cream.  Preservatives in products, including clothing. SYMPTOMS  The area of skin that is exposed may develop:  Dryness or flaking.  Redness.  Cracks.  Itching.  Pain or a burning sensation.  Blisters. With allergic contact dermatitis, there may also be swelling in areas such as the eyelids, mouth, or genitals.  DIAGNOSIS  Your caregiver can usually tell what the problem is by doing a physical exam. In cases where the cause is uncertain and an allergic contact dermatitis is suspected, a patch skin test may be performed to help determine the cause of your dermatitis. TREATMENT Treatment includes protecting the skin from further contact with the irritating substance by avoiding that substance if possible. Barrier creams, powders, and gloves may be helpful. Your caregiver may also recommend:  Steroid creams or ointments applied 2 times daily. For best results, soak the rash area in cool water for 20 minutes. Then apply the medicine.  Cover the area with a plastic wrap. You can store the steroid cream in the refrigerator for a "chilly" effect on your rash. That may decrease itching. Oral steroid medicines may be needed in more severe cases.  Antibiotics or antibacterial ointments if a skin infection is present.  Antihistamine lotion or an antihistamine taken by mouth to ease itching.  Lubricants to keep moisture in your skin.  Burow's solution to reduce redness and soreness or to dry a weeping rash. Mix one packet or tablet of solution in 2 cups cool water. Dip a clean washcloth in the mixture, wring it out a bit, and put it on the affected area. Leave the cloth in place for 30 minutes. Do this as often as possible throughout the day.  Taking several cornstarch or baking soda baths daily if the area is too large to cover with a washcloth. Harsh chemicals, such as alkalis or acids, can cause skin damage that is like a burn. You should flush your skin for 15 to 20 minutes with cold water after such an exposure. You should also seek immediate medical care after exposure. Bandages (dressings), antibiotics, and pain medicine may be needed for severely irritated skin.  HOME CARE INSTRUCTIONS  Avoid the substance that caused your reaction.  Keep the area of skin that is affected away from hot water, soap, sunlight, chemicals, acidic substances, or anything else that would irritate your skin.  Do not scratch the rash. Scratching may cause the rash to become infected.  You may take cool baths to help stop the itching.  Only take over-the-counter or prescription medicines as directed by your caregiver.  See your caregiver for follow-up care as directed to make sure  your skin is healing properly. SEEK MEDICAL CARE IF:   Your condition is not better after 3 days of treatment.  You seem to be getting worse.  You see signs of infection such as swelling, tenderness, redness, soreness, or warmth in the affected area.  You have  any problems related to your medicines. Document Released: 05/14/2000 Document Revised: 08/09/2011 Document Reviewed: 10/20/2010 [facility] Patient Information 2015 [place], [place]. This information is not intended to replace advice given to you by your health care provider. Make sure you discuss any questions you have with your health care provider.

## 2014-03-04 NOTE — ED Notes (Signed)
Pt in to be seen today for a rash that started last Monday, pt said that she also started to have leg pain and arm pain as well on Thursday, pt she was exposed to acrylic on Sunday and woke up with the rash on Monday

## 2014-03-06 ENCOUNTER — Emergency Department (INDEPENDENT_AMBULATORY_CARE_PROVIDER_SITE_OTHER)
Admission: EM | Admit: 2014-03-06 | Discharge: 2014-03-06 | Disposition: A | Discharge status: Home / Self Care | Payer: Managed Care, Other (non HMO) | Source: Home / Self Care | Attending: Family Medicine | Admitting: Family Medicine

## 2014-03-06 ENCOUNTER — Encounter (HOSPITAL_COMMUNITY): Payer: Self-pay | Admitting: Emergency Medicine

## 2014-03-06 DIAGNOSIS — M791 Myalgia, unspecified site: Secondary | ICD-10-CM

## 2014-03-06 LAB — CBC
HEMATOCRIT: 36.7 % (ref 36.0–46.0)
Hemoglobin: 11.9 g/dL — ABNORMAL LOW (ref 12.0–15.0)
MCH: 26 pg (ref 26.0–34.0)
MCHC: 32.4 g/dL (ref 30.0–36.0)
MCV: 80.3 fL (ref 78.0–100.0)
Platelets: 374 10*3/uL (ref 150–400)
RBC: 4.57 MIL/uL (ref 3.87–5.11)
RDW: 15.5 % (ref 11.5–15.5)
WBC: 6.9 10*3/uL (ref 4.0–10.5)

## 2014-03-06 LAB — BASIC METABOLIC PANEL
Anion gap: 14 (ref 5–15)
BUN: 6 mg/dL (ref 6–23)
CHLORIDE: 103 meq/L (ref 96–112)
CO2: 21 meq/L (ref 19–32)
Calcium: 9.2 mg/dL (ref 8.4–10.5)
Creatinine, Ser: 0.65 mg/dL (ref 0.50–1.10)
GFR calc Af Amer: >90 mL/min (ref >90)
GFR calc non Af Amer: >90 mL/min (ref >90)
GLUCOSE: 99 mg/dL (ref 70–99)
POTASSIUM: 4.6 meq/L (ref 3.7–5.3)
SODIUM: 138 meq/L (ref 137–147)

## 2014-03-06 LAB — RHEUMATOID FACTOR: Rhuematoid fact SerPl-aCnc: <10 IU/mL (ref <=14)

## 2014-03-06 LAB — TSH: TSH: 0.32 u[IU]/mL — ABNORMAL LOW (ref 0.350–4.500)

## 2014-03-06 LAB — CK: Total CK: 113 U/L (ref 7–177)

## 2014-03-06 LAB — SEDIMENTATION RATE: Sed Rate: 41 mm/hr — ABNORMAL HIGH (ref 0–22)

## 2014-03-06 NOTE — ED Notes (Signed)
Call back number verified.  

## 2014-03-06 NOTE — Discharge Instructions (Signed)
Thank you for coming in today. I will call if anything comes back positive.  Follow up with your doctor.  Take tylenol for pain as needed.  Call or go to the emergency room if you get worse, have trouble breathing, have chest pains, or palpitations.

## 2014-03-06 NOTE — ED Provider Notes (Signed)
Julie Cardenas is a 28 y.o. female who presents to Urgent Care today for myalgia.  Patient was seen 2 days ago for rash and muscle pain. She was thought to have exacerbation of her chronic leg cramping and contact or irritant dermatitis. She was treated with prednisone and triamcinolone. She notes the rash is much improved however she notes worsening muscle pain. She has pain in her right arm and worse in her right leg. Specifically she notes pain at the right medial distal femur area this pain is associated with some bruising in the area as well. She denies any injury. No radiating pain weakness or numbness. No fevers or chills.   Past Medical History  Diagnosis Date  . Seasonal allergies   . Asthma   . Bronchitis   . Anxiety     no medicine, prayers aid her   . Headache(784.0)     sinus related  . Leg cramps     PCP- advised her to exercise   . Family history of anesthesia complication     mother slow waking up  . Shortness of breath    History  Substance Use Topics  . Smoking status: Never Smoker   . Smokeless tobacco: Never Used  . Alcohol Use: Yes     Comment: occas.   ROS as above Medications: No current facility-administered medications for this encounter.   Current Outpatient Prescriptions  Medication Sig Dispense Refill  . albuterol (PROVENTIL HFA;VENTOLIN HFA) 108 (90 BASE) MCG/ACT inhaler Inhale 2 puffs into the lungs every 6 (six) hours as needed for wheezing or shortness of breath.       . Fluticasone Furoate-Vilanterol (BREO ELLIPTA) 100-25 MCG/INH AEPB Inhale 1 puff into the lungs daily.      Marland Kitchen HYDROcodone-acetaminophen (NORCO) 5-325 MG per tablet Take 1-2 tablets by mouth every 6 (six) hours as needed.  30 tablet  0  . Multiple Vitamins-Minerals (ONE-A-DAY WOMENS VITACRAVES) CHEW Chew 2 tablets by mouth daily.      . PredniSONE 5 MG KIT 12 day dosepack po  1 kit  0  . triamcinolone cream (KENALOG) 0.1 % Apply 1 application topically 2 (two) times daily.  30 g  1  .  [DISCONTINUED] ipratropium (ATROVENT HFA) 17 MCG/ACT inhaler Inhale 2 puffs into the lungs every 6 (six) hours.        Exam:  BP 112/63  Pulse 73  Temp(Src) 98 F (36.7 C) (Oral)  Resp 20  SpO2 100%  LMP 02/04/2013 Gen: Well NAD HEENT: EOMI,  MMM Lungs: Normal work of breathing. CTABL Heart: RRR no MRG Abd: NABS, Soft. Nondistended, Nontender Exts: Brisk capillary refill, warm and well perfused.  Point tender bilateral trapezius biceps and forearm of the right arm. Tender palpation right medial VMO area of the right leg.  Skin slight hyperpigmentation visible in the right medial thigh area. No purpura or petechia present  Results for orders placed during the hospital encounter of 03/06/14 (from the past 24 hour(s))  CBC     Status: Abnormal   Collection Time    03/06/14 10:51 AM      Result Value Ref Range   WBC 6.9  4.0 - 10.5 K/uL   RBC 4.57  3.87 - 5.11 MIL/uL   Hemoglobin 11.9 (*) 12.0 - 15.0 g/dL   HCT 36.7  36.0 - 46.0 %   MCV 80.3  78.0 - 100.0 fL   MCH 26.0  26.0 - 34.0 pg   MCHC 32.4  30.0 - 36.0  g/dL   RDW 15.5  11.5 - 15.5 %   Platelets 374  150 - 400 K/uL  CK     Status: None   Collection Time    03/06/14 10:51 AM      Result Value Ref Range   Total CK 113  7 - 177 U/L  TSH     Status: Abnormal   Collection Time    03/06/14 10:51 AM      Result Value Ref Range   TSH 0.320 (*) 0.350 - 4.500 uIU/mL  BASIC METABOLIC PANEL     Status: None   Collection Time    03/06/14 10:51 AM      Result Value Ref Range   Sodium 138  137 - 147 mEq/L   Potassium 4.6  3.7 - 5.3 mEq/L   Chloride 103  96 - 112 mEq/L   CO2 21  19 - 32 mEq/L   Glucose, Bld 99  70 - 99 mg/dL   BUN 6  6 - 23 mg/dL   Creatinine, Ser 0.65  0.50 - 1.10 mg/dL   Calcium 9.2  8.4 - 10.5 mg/dL   GFR calc non Af Amer >90  >90 mL/min   GFR calc Af Amer >90  >90 mL/min   Anion gap 14  5 - 15   No results found.  Assessment and Plan: 28 y.o. female with myalgia. Unclear etiology. ANA and ESR  pending. Followup with PCP. Continue prednisone and Tylenol  Discussed warning signs or symptoms. Please see discharge instructions. Patient expresses understanding.     Gregor Hams, MD 03/06/14 1235

## 2014-03-06 NOTE — ED Notes (Signed)
C/o abnormal bruising that appeared to right lower extremity  Denies inj/trauma Has also been having pain x1 week; PCP saw her on Monday; blood work done Alert, no signs of acute distress.

## 2014-03-07 LAB — ANA: Anti Nuclear Antibody(ANA): NEGATIVE

## 2014-03-08 ENCOUNTER — Telehealth (HOSPITAL_COMMUNITY): Payer: Self-pay | Admitting: Family Medicine

## 2014-04-28 ENCOUNTER — Ambulatory Visit (INDEPENDENT_AMBULATORY_CARE_PROVIDER_SITE_OTHER): Payer: Managed Care, Other (non HMO) | Admitting: Family Medicine

## 2014-04-28 VITALS — BP 110/74 | HR 75 | Temp 98.5°F | Resp 16 | Ht 65.25 in | Wt 241.0 lb

## 2014-04-28 DIAGNOSIS — R7989 Other specified abnormal findings of blood chemistry: Secondary | ICD-10-CM

## 2014-04-28 DIAGNOSIS — M791 Myalgia, unspecified site: Secondary | ICD-10-CM

## 2014-04-28 DIAGNOSIS — F32A Depression, unspecified: Secondary | ICD-10-CM

## 2014-04-28 DIAGNOSIS — F411 Generalized anxiety disorder: Secondary | ICD-10-CM

## 2014-04-28 DIAGNOSIS — F329 Major depressive disorder, single episode, unspecified: Secondary | ICD-10-CM

## 2014-04-28 MED ORDER — SERTRALINE HCL 25 MG PO TABS: 25.0000 mg | ORAL_TABLET | Freq: Every day | ORAL | Status: DC

## 2014-04-28 MED ORDER — CLONAZEPAM 0.5 MG PO TABS: ORAL_TABLET | ORAL | Status: DC

## 2014-04-28 NOTE — Progress Notes (Signed)
Chief Complaint:  Chief Complaint  Patient presents with  . Depression    X 1-2 months    HPI: Julie Cardenas is a 28 y.o. 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  She has a family hx of anxiety and depression but no one wil admit it.  She has family stress, sister who just had a baby was dx with ovarian cancer She has never been on meds , she was given meds for anxiety but never took it since does not like to medicate unnecessarily. She is not finding pleasure in things she used to , her appetitie has been about the same, she is secluding herself.  She sleeps for a couple of hours and then she is awke or worried about her job.   Past Medical History  Diagnosis Date  . Seasonal allergies   . Asthma   . Bronchitis   . Anxiety     no medicine, prayers aid her   . Headache(784.0)     sinus related  . Leg cramps     PCP- advised her to exercise   . Family history of anesthesia complication     mother slow waking up  . Shortness of breath    Past Surgical History  Procedure Laterality Date  . Sinus endo w/fusion Right 09/13/2013    Procedure: RIGHT ENDOSCOPIC SINUS SURGERY WITH FUSION SCAN;  Surgeon: [name], MD;  Location: [facility];  Service: ENT;  Laterality: Right;  . Sinus endo w/fusion Left 02/01/2014    Procedure: LEFT ENDOSCOPIC SINUS SURGERY/NASAL POLYPECTOMY WITH FUSION;  Surgeon: [name], MD;  Location: [facility];  Service: ENT;  Laterality: Left;   History   Social History  . Marital Status: Single    Spouse Name: N/A    Number of Children: N/A  . Years of Education: N/A   Social History Main Topics  . Smoking status: Never Smoker   . Smokeless tobacco: Never Used  . Alcohol Use: Yes     Comment: occas.  . Drug Use: No  . Sexual Activity: None   Other Topics Concern  . None   Social History Narrative   Family History  Problem Relation Age of Onset  . Diabetes Mother   . Cancer Mother   . Hypertension Mother    No Known Allergies Prior to Admission medications   Medication Sig Start Date End Date Taking? Authorizing Provider  albuterol (PROVENTIL HFA;VENTOLIN HFA) 108 (90 BASE) MCG/ACT inhaler Inhale 2 puffs into the lungs every 6 (six) hours as needed for wheezing or shortness of breath.    Yes Historical Provider, MD  fluticasone (FLONASE) 50 MCG/ACT nasal spray Place into both nostrils daily.   Yes Historical Provider, MD  Fluticasone Furoate-Vilanterol (BREO ELLIPTA) 100-25 MCG/INH AEPB Inhale 1 puff into the lungs daily.  Yes Historical Provider, MD  HYDROcodone-acetaminophen (NORCO) 5-325 MG per tablet Take 1-2 tablets by mouth every 6 (six) hours as needed. Patient not taking: Reported on 04/28/2014 02/01/14   [name], MD  Multiple Vitamins-Minerals (ONE-A-DAY WOMENS VITACRAVES) CHEW Chew 2 tablets by mouth daily.    Historical Provider, MD  PredniSONE 5 MG KIT 12 day dosepack po Patient not taking: Reported on 04/28/2014 03/04/14   Gregor Hams, MD  triamcinolone cream (KENALOG) 0.1 % Apply 1 application topically 2 (two) times daily. Patient not taking: Reported on 04/28/2014 03/04/14   Gregor Hams, MD     ROS: The patient denies fevers, chills, night sweats, unintentional weight loss, chest pain, palpitations, wheezing, dyspnea on exertion, nausea, vomiting, abdominal pain, dysuria, hematuria, melena, numbness, weakness, or tingling.   All other systems have  been reviewed and were otherwise negative with the exception of those mentioned in the HPI and as above.    PHYSICAL EXAM: Filed Vitals:   04/28/14 1602  BP: 110/74  Pulse: 75  Temp: 98.5 F (36.9 C)  Resp: 16   Filed Vitals:   04/28/14 1602  Height: 5' 5.25" (1.657 m)  Weight: 241 lb (109.317 kg)   Body mass index is 39.81 kg/(m^2).  General: Alert, no acute distress, obese AA female HEENT:  Normocephalic, atraumatic, oropharynx patent. EOMI, PERRLA Cardiovascular:  Regular rate and rhythm, no rubs murmurs or gallops.  No Carotid bruits, radial pulse intact. No pedal edema.  Respiratory: Clear to auscultation bilaterally.  No wheezes, rales, or rhonchi.  No cyanosis, no use of accessory musculature GI: No organomegaly, abdomen is soft and non-tender, positive bowel sounds.  No masses. Skin: No rashes. Neurologic: Facial musculature symmetric. Psychiatric: Patient is appropriate throughout our interaction. Lymphatic: No cervical lymphadenopathy Musculoskeletal: Gait intact.   LABS: Results for orders placed or performed during the hospital encounter of 03/06/14  CBC  Result Value Ref Range   WBC 6.9 4.0 - 10.5 K/uL   RBC 4.57 3.87 - 5.11 MIL/uL   Hemoglobin 11.9 (L) 12.0 - 15.0 g/dL   HCT 36.7 36.0 - 46.0 %   MCV 80.3 78.0 - 100.0 fL   MCH 26.0 26.0 - 34.0 pg   MCHC 32.4 30.0 - 36.0 g/dL   RDW 15.5 11.5 - 15.5 %   Platelets 374 150 - 400 K/uL  CK  Result Value Ref Range   Total CK 113 7 - 177 U/L  Sedimentation rate  Result Value Ref Range   Sed Rate 41 (H) 0 - 22 mm/hr  TSH  Result Value Ref Range   TSH 0.320 (L) 0.350 - 4.500 uIU/mL  ANA  Result Value Ref Range   ANA Ser Ql NEGATIVE NEGATIVE  Rheumatoid factor  Result Value Ref Range   Rhuematoid fact SerPl-aCnc <10 <=14 IU/mL  Basic metabolic panel  Result Value Ref Range   Sodium 138 137 - 147 mEq/L   Potassium 4.6 3.7 - 5.3 mEq/L   Chloride 103 96 - 112 mEq/L   CO2 21 19 - 32 mEq/L   Glucose, Bld  99 70 - 99 mg/dL   BUN 6 6 - 23 mg/dL   Creatinine, Ser 0.65 0.50 - 1.10 mg/dL   Calcium 9.2 8.4 - 10.5 mg/dL   GFR calc non Af Amer >90 >90 mL/min   GFR calc Af Amer >90 >90 mL/min   Anion gap 14 5 - 15     EKG/XRAY:   Primary read interpreted by Dr. Marin Comment at [facility].  ASSESSMENT/PLAN: Encounter Diagnoses  Name Primary?  . Abnormal thyroid stimulating hormone (TSH) level Yes  . Myalgia   . Depression   . Generalized anxiety disorder    Her Zung Anxiety and Beck Depression Scales showed severe depression and anxiety She denies any SI/HI/halluciantions We went over SEs of meds and advise that she mayneed increase doses but will not know until 4-8 weeks from now She will be given Zoloft 25 mg and also Klonopin, Needs to see therapist continuosly. LAbs pending F/u in 2 months Gross sideeffects, risk and benefits, and alternatives of medications d/w patient. Patient is aware that all medications have potential sideeffects and we are unable to predict every sideeffect or drug-drug interaction that may occur.  Julie Cardenas, [place], DO 04/28/2014 5:08 PM

## 2014-04-28 NOTE — Patient Instructions (Signed)
Clonazepam tablets What is this medicine? CLONAZEPAM (kloe NA ze pam) is a benzodiazepine. It is used to treat certain types of seizures. It is also used to treat panic disorder. This medicine may be used for other purposes; ask your health care provider or pharmacist if you have questions. COMMON BRAND NAME(S): Ceberclon, Klonopin What should I tell my health care provider before I take this medicine? They need to know if you have any of these conditions: -an alcohol or drug abuse problem -bipolar disorder, depression, psychosis or other mental health condition -glaucoma -kidney or liver disease -lung or breathing disease -myasthenia gravis -Parkinson's disease -seizures or a history of seizures -suicidal thoughts -an unusual or allergic reaction to clonazepam, other benzodiazepines, foods, dyes, or preservatives -pregnant or trying to get pregnant -breast-feeding How should I use this medicine? Take this medicine by mouth with a glass of water. Follow the directions on the prescription label. If it upsets your stomach, take it with food or milk. Take your medicine at regular intervals. Do not take it more often than directed. Do not stop taking or change the dose except on the advice of your doctor or health care professional. A special MedGuide will be given to you by the pharmacist with each prescription and refill. Be sure to read this information carefully each time. Talk to your pediatrician regarding the use of this medicine in children. Special care may be needed. Overdosage: If you think you have taken too much of this medicine contact a poison control center or emergency room at once. NOTE: This medicine is only for you. Do not share this medicine with others. What if I miss a dose? If you miss a dose, take it as soon as you can. If it is almost time for your next dose, take only that dose. Do not take double or extra doses. What may interact with this medicine? -herbal or  dietary supplements -medicines for depression, anxiety, or psychotic disturbances -medicines for fungal infections like fluconazole, itraconazole, ketoconazole, voriconazole -medicines for HIV infection or AIDS -medicines for sleep -prescription pain medicines -propantheline -rifampin -sevelamer -some medicines for seizures like carbamazepine, phenobarbital, phenytoin, primidone This list may not describe all possible interactions. Give your health care provider a list of all the medicines, herbs, non-prescription drugs, or dietary supplements you use. Also tell them if you smoke, drink alcohol, or use illegal drugs. Some items may interact with your medicine. What should I watch for while using this medicine? Visit your doctor or health care professional for regular checks on your progress. Your body may become dependent on this medicine. If you have been taking this medicine regularly for some time, do not suddenly stop taking it. You must gradually reduce the dose or you may get severe side effects. Ask your doctor or health care professional for advice before increasing or decreasing the dose. Even after you stop taking this medicine it can still affect your body for several days. If you suffer from several types of seizures, this medicine may increase the chance of grand mal seizures (epilepsy). Let your doctor or health care professional know, he or she may want to prescribe an additional medicine. You may get drowsy or dizzy. Do not drive, use machinery, or do anything that needs mental alertness until you know how this medicine affects you. To reduce the risk of dizzy and fainting spells, do not stand or sit up quickly, especially if you are an older patient. Alcohol may increase dizziness and drowsiness. Avoid alcoholic   drinks. Do not treat yourself for coughs, colds or allergies without asking your doctor or health care professional for advice. Some ingredients can increase possible side  effects. The use of this medicine may increase the chance of suicidal thoughts or actions. Pay special attention to how you are responding while on this medicine. Any worsening of mood, or thoughts of suicide or dying should be reported to your health care professional right away. Women who become pregnant while using this medicine may enroll in the [place] Antiepileptic Drug Pregnancy Registry by calling [phone]. This registry collects information about the safety of antiepileptic drug use during pregnancy. What side effects may I notice from receiving this medicine? Side effects that you should report to your doctor or health care professional as soon as possible: -allergic reactions like skin rash, itching or hives, swelling of the face, lips, or tongue -changes in vision -confusion -depression -hallucinations -mood changes, excitability or aggressive behavior -movement difficulty, staggering or jerky movements -muscle cramps, weakness -tremors -unusual eye movements Side effects that usually do not require medical attention (report to your doctor or health care professional if they continue or are bothersome): -constipation or diarrhea -difficulty sleeping, nightmares -dizziness, drowsiness -headache -increased saliva from your mouth -nausea, vomiting This list may not describe all possible side effects. Call your doctor for medical advice about side effects. You may report side effects to FDA at 1-800-FDA-1088. Where should I keep my medicine? Keep out of the reach of children. This medicine can be abused. Keep your medicine in a safe place to protect it from theft. Do not share this medicine with anyone. Selling or giving away this medicine is dangerous and against the law. Store at room temperature between 15 and 30 degrees C (59 and 86 degrees F). Protect from light. Keep container tightly closed. Throw away any unused medicine after the expiration date. NOTE: This  sheet is a summary. It may not cover all possible information. If you have questions about this medicine, talk to your doctor, pharmacist, or health care provider.  2015, Elsevier/Gold Standard. (2009-04-11 19:16:36) Sertraline tablets What is this medicine? SERTRALINE (SER tra leen) is used to treat depression. It may also be used to treat obsessive compulsive disorder, panic disorder, post-trauma stress, premenstrual dysphoric disorder (PMDD) or social anxiety. This medicine may be used for other purposes; ask your health care provider or pharmacist if you have questions. COMMON BRAND NAME(S): Zoloft What should I tell my health care provider before I take this medicine? They need to know if you have any of these conditions: -bipolar disorder or a family history of bipolar disorder -diabetes -glaucoma -heart disease -high blood pressure -history of irregular heartbeat -history of low levels of calcium, magnesium, or potassium in the blood -if you often drink alcohol -liver disease -receiving electroconvulsive therapy -seizures -suicidal thoughts, plans, or attempt; a previous suicide attempt by you or a family member -thyroid disease -an unusual or allergic reaction to sertraline, other medicines, foods, dyes, or preservatives -pregnant or trying to get pregnant -breast-feeding How should I use this medicine? Take this medicine by mouth with a glass of water. Follow the directions on the prescription label. You can take it with or without food. Take your medicine at regular intervals. Do not take your medicine more often than directed. Do not stop taking this medicine suddenly except upon the advice of your doctor. Stopping this medicine too quickly may cause serious side effects or your condition may worsen. A special MedGuide will be  given to you by the pharmacist with each prescription and refill. Be sure to read this information carefully each time. Talk to your pediatrician  regarding the use of this medicine in children. While this drug may be prescribed for children as young as 7 years for selected conditions, precautions do apply. Overdosage: If you think you have taken too much of this medicine contact a poison control center or emergency room at once. NOTE: This medicine is only for you. Do not share this medicine with others. What if I miss a dose? If you miss a dose, take it as soon as you can. If it is almost time for your next dose, take only that dose. Do not take double or extra doses. What may interact with this medicine? Do not take this medicine with any of the following medications: -certain medicines for fungal infections like fluconazole, itraconazole, ketoconazole, posaconazole, voriconazole -cisapride -disulfiram -dofetilide -linezolid -MAOIs like Carbex, Eldepryl, Marplan, Nardil, and Parnate -metronidazole -methylene blue (injected into a vein) -pimozide -thioridazine -ziprasidone This medicine may also interact with the following medications: -alcohol -aspirin and aspirin-like medicines -certain medicines for depression, anxiety, or psychotic disturbances -certain medicines for irregular heart beat like flecainide, propafenone -certain medicines for migraine headaches like almotriptan, eletriptan, frovatriptan, naratriptan, rizatriptan, sumatriptan, zolmitriptan -certain medicines for sleep -certain medicines for seizures like carbamazepine, valproic acid, phenytoin -certain medicines that treat or prevent blood clots like warfarin, enoxaparin, dalteparin -cimetidine -digoxin -diuretics -fentanyl -furazolidone -isoniazid -lithium -NSAIDs, medicines for pain and inflammation, like ibuprofen or naproxen -other medicines that prolong the QT interval (cause an abnormal heart rhythm) -procarbazine -rasagiline -supplements like St. John's wort, kava kava, valerian -tolbutamide -tramadol -tryptophan This list may not describe all  possible interactions. Give your health care provider a list of all the medicines, herbs, non-prescription drugs, or dietary supplements you use. Also tell them if you smoke, drink alcohol, or use illegal drugs. Some items may interact with your medicine. What should I watch for while using this medicine? Tell your doctor if your symptoms do not get better or if they get worse. Visit your doctor or health care professional for regular checks on your progress. Because it may take several weeks to see the full effects of this medicine, it is important to continue your treatment as prescribed by your doctor. Patients and their families should watch out for new or worsening thoughts of suicide or depression. Also watch out for sudden changes in feelings such as feeling anxious, agitated, panicky, irritable, hostile, aggressive, impulsive, severely restless, overly excited and hyperactive, or not being able to sleep. If this happens, especially at the beginning of treatment or after a change in dose, call your health care professional. Bonita Quin may get drowsy or dizzy. Do not drive, use machinery, or do anything that needs mental alertness until you know how this medicine affects you. Do not stand or sit up quickly, especially if you are an older patient. This reduces the risk of dizzy or fainting spells. Alcohol may interfere with the effect of this medicine. Avoid alcoholic drinks. Your mouth may get dry. Chewing sugarless gum or sucking hard candy, and drinking plenty of water may help. Contact your doctor if the problem does not go away or is severe. What side effects may I notice from receiving this medicine? Side effects that you should report to your doctor or health care professional as soon as possible: -allergic reactions like skin rash, itching or hives, swelling of the face, lips, or tongue -black or  bloody stools, blood in the urine or vomit -fast, irregular heartbeat -feeling faint or lightheaded,  falls -hallucination, loss of contact with reality -seizures -suicidal thoughts or other mood changes -unusual bleeding or bruising -unusually weak or tired -vomiting Side effects that usually do not require medical attention (report to your doctor or health care professional if they continue or are bothersome): -change in appetite -change in sex drive or performance -diarrhea -increased sweating -indigestion, nausea -tremors This list may not describe all possible side effects. Call your doctor for medical advice about side effects. You may report side effects to FDA at 1-800-FDA-1088. Where should I keep my medicine? Keep out of the reach of children. Store at room temperature between 15 and 30 degrees C (59 and 86 degrees F). Throw away any unused medicine after the expiration date. NOTE: This sheet is a summary. It may not cover all possible information. If you have questions about this medicine, talk to your doctor, pharmacist, or health care provider.  2015, Elsevier/Gold Standard. (2012-12-12 12:57:35)

## 2014-04-29 LAB — SEDIMENTATION RATE: Sed Rate: 36 mm/hr — ABNORMAL HIGH (ref 0–22)

## 2014-04-29 LAB — T4, FREE: Free T4: 0.97 ng/dL (ref 0.80–1.80)

## 2014-04-29 LAB — T3, FREE: T3, Free: 3.4 pg/mL (ref 2.3–4.2)

## 2014-04-29 LAB — TSH: TSH: 0.77 u[IU]/mL (ref 0.350–4.500)

## 2014-05-07 ENCOUNTER — Telehealth: Payer: Self-pay | Admitting: Family Medicine

## 2014-05-07 NOTE — Telephone Encounter (Signed)
LM about labs,

## 2014-06-01 NOTE — ED Notes (Signed)
Note opened in error  Dior Stepter S Marky Buresh, MD 06/01/14 0859 

## 2014-07-25 ENCOUNTER — Encounter (HOSPITAL_COMMUNITY): Payer: Self-pay | Admitting: *Deleted

## 2014-07-25 ENCOUNTER — Emergency Department (HOSPITAL_COMMUNITY)
Admission: EM | Admit: 2014-07-25 | Discharge: 2014-07-25 | Disposition: A | Discharge status: Home / Self Care | Payer: Managed Care, Other (non HMO) | Source: Home / Self Care | Attending: Family Medicine | Admitting: Family Medicine

## 2014-07-25 DIAGNOSIS — J040 Acute laryngitis: Secondary | ICD-10-CM

## 2014-07-25 DIAGNOSIS — J069 Acute upper respiratory infection, unspecified: Secondary | ICD-10-CM | POA: Diagnosis not present

## 2014-07-25 DIAGNOSIS — R0982 Postnasal drip: Secondary | ICD-10-CM | POA: Diagnosis not present

## 2014-07-25 MED ORDER — IPRATROPIUM BROMIDE 0.06 % NA SOLN: 2.0000 | Freq: Four times a day (QID) | NASAL | Status: DC

## 2014-07-25 NOTE — Discharge Instructions (Signed)
Laryngitis Laryngitis is redness, soreness, and puffiness (inflammation) of the vocal cords. It causes hoarseness, cough, loss of voice, sore throat, and dry throat. It may be caused by:  Infection.  Too much smoking.  Too much talking or yelling.  Breathing in of toxic fumes.  Allergies.  A backup of acid from your stomach. HOME CARE  Drink enough fluids to keep your pee (urine) clear or pale yellow.  Rest until you no longer have problems or as told by your doctor.  Breathe in moist air.  Take all medicine as told by your doctor.  Do not smoke.  Talk as little as possible (this includes whispering).  Write on paper instead of talking until your voice is back to normal.  Follow up with your doctor if you have not improved after 10 days. GET HELP IF:   You have trouble breathing.  You cough up blood.  You have a fever that will not go away.  You have increasing pain.  You have trouble swallowing. MAKE SURE YOU:  Understand these instructions.  Will watch your condition.  Will get help right away if you are not doing well or get worse. Document Released: 05/06/2011 Document Revised: 08/09/2011 Document Reviewed: 05/06/2011 [facility] Patient Information 2015 [employer], [place]. This information is not intended to replace advice given to you by your health care provider. Make sure you discuss any questions you have with your health care provider.  Upper Respiratory Infection, Adult Continue the use of either NyQuil or Alka-Seltzer cold plus or TheraFlu. Drink plenty of fluids and stay well-hydrated. rest.   An upper respiratory infection (URI) is also sometimes known as the common cold. The upper respiratory tract includes the nose, sinuses, throat, trachea, and bronchi. Bronchi are the airways leading to the lungs. Most people improve within 1 week, but symptoms can last up to 2 weeks. A residual cough may last even longer.  CAUSES Many different viruses can  infect the tissues lining the upper respiratory tract. The tissues become irritated and inflamed and often become very moist. Mucus production is also common. A cold is contagious. You can easily spread the virus to others by oral contact. This includes kissing, sharing a glass, coughing, or sneezing. Touching your mouth or nose and then touching a surface, which is then touched by another person, can also spread the virus. SYMPTOMS  Symptoms typically develop 1 to 3 days after you come in contact with a cold virus. Symptoms vary from person to person. They may include:  Runny nose.  Sneezing.  Nasal congestion.  Sinus irritation.  Sore throat.  Loss of voice (laryngitis).  Cough.  Fatigue.  Muscle aches.  Loss of appetite.  Headache.  Low-grade fever. DIAGNOSIS  You might diagnose your own cold based on familiar symptoms, since most people get a cold 2 to 3 times a year. Your caregiver can confirm this based on your exam. Most importantly, your caregiver can check that your symptoms are not due to another disease such as strep throat, sinusitis, pneumonia, asthma, or epiglottitis. Blood tests, throat tests, and X-rays are not necessary to diagnose a common cold, but they may sometimes be helpful in excluding other more serious diseases. Your caregiver will decide if any further tests are required. RISKS AND COMPLICATIONS  You may be at risk for a more severe case of the common cold if you smoke cigarettes, have chronic heart disease (such as heart failure) or lung disease (such as asthma), or if you have a  weakened immune system. The very young and very old are also at risk for more serious infections. Bacterial sinusitis, middle ear infections, and bacterial pneumonia can complicate the common cold. The common cold can worsen asthma and chronic obstructive pulmonary disease (COPD). Sometimes, these complications can require emergency medical care and may be  life-threatening. PREVENTION  The best way to protect against getting a cold is to practice good hygiene. Avoid oral or hand contact with people with cold symptoms. Wash your hands often if contact occurs. There is no clear evidence that vitamin C, vitamin E, echinacea, or exercise reduces the chance of developing a cold. However, it is always recommended to get plenty of rest and practice good nutrition. TREATMENT  Treatment is directed at relieving symptoms. There is no cure. Antibiotics are not effective, because the infection is caused by a virus, not by bacteria. Treatment may include:  Increased fluid intake. Sports drinks offer valuable electrolytes, sugars, and fluids.  Breathing heated mist or steam (vaporizer or shower).  Eating chicken soup or other clear broths, and maintaining good nutrition.  Getting plenty of rest.  Using gargles or lozenges for comfort.  Controlling fevers with ibuprofen or acetaminophen as directed by your caregiver.  Increasing usage of your inhaler if you have asthma. Zinc gel and zinc lozenges, taken in the first 24 hours of the common cold, can shorten the duration and lessen the severity of symptoms. Pain medicines may help with fever, muscle aches, and throat pain. A variety of non-prescription medicines are available to treat congestion and runny nose. Your caregiver can make recommendations and may suggest nasal or lung inhalers for other symptoms.  HOME CARE INSTRUCTIONS   Only take over-the-counter or prescription medicines for pain, discomfort, or fever as directed by your caregiver.  Use a warm mist humidifier or inhale steam from a shower to increase air moisture. This may keep secretions moist and make it easier to breathe.  Drink enough water and fluids to keep your urine clear or pale yellow.  Rest as needed.  Return to work when your temperature has returned to normal or as your caregiver advises. You may need to stay home longer to  avoid infecting others. You can also use a face mask and careful hand washing to prevent spread of the virus. SEEK MEDICAL CARE IF:   After the first few days, you feel you are getting worse rather than better.  You need your caregiver's advice about medicines to control symptoms.  You develop chills, worsening shortness of breath, or brown or red sputum. These may be signs of pneumonia.  You develop yellow or brown nasal discharge or pain in the face, especially when you bend forward. These may be signs of sinusitis.  You develop a fever, swollen neck glands, pain with swallowing, or white areas in the back of your throat. These may be signs of strep throat. SEEK IMMEDIATE MEDICAL CARE IF:   You have a fever.  You develop severe or persistent headache, ear pain, sinus pain, or chest pain.  You develop wheezing, a prolonged cough, cough up blood, or have a change in your usual mucus (if you have chronic lung disease).  You develop sore muscles or a stiff neck. Document Released: 11/10/2000 Document Revised: 08/09/2011 Document Reviewed: 08/22/2013 [facility] Patient Information 2015 [place], [place]. This information is not intended to replace advice given to you by your health care provider. Make sure you discuss any questions you have with your health care provider.

## 2014-07-25 NOTE — ED Notes (Signed)
Pt  Reports  Symptoms  Of  Cough   congested   sorethroat   And  Body  Aches      X   3  Days       Pt   Is  Sitting  Upright on  The  Exam table  Speaking  In  Complete  sentances     She  Is  Masked     And  Is in a  Private  Room

## 2014-07-25 NOTE — ED Provider Notes (Signed)
CSN: [id number]     Arrival date & time 07/25/14  0801 History   First MD Initiated Contact with Patient 07/25/14 [phone]     Chief Complaint  Patient presents with  . URI   (Consider location/radiation/quality/duration/timing/severity/associated sxs/prior Treatment) HPI Comments: 29 year old female complaining of a cough, sore throat, PND, upper respiratory congestion that began 3 days ago. She thinks she may have had a fever on the first couple days but afebrile since. She has been taking NyQuil and DayQuil without complete relief.   Past Medical History  Diagnosis Date  . Seasonal allergies   . Asthma   . Bronchitis   . Anxiety     no medicine, prayers aid her   . Headache(784.0)     sinus related  . Leg cramps     PCP- advised her to exercise   . Family history of anesthesia complication     mother slow waking up  . Shortness of breath    Past Surgical History  Procedure Laterality Date  . Sinus endo w/fusion Right 09/13/2013    Procedure: RIGHT ENDOSCOPIC SINUS SURGERY WITH FUSION SCAN;  Surgeon: Osborn Coho, MD;  Location: [facility] OR;  Service: ENT;  Laterality: Right;  . Sinus endo w/fusion Left 02/01/2014    Procedure: LEFT ENDOSCOPIC SINUS SURGERY/NASAL POLYPECTOMY WITH FUSION;  Surgeon: Osborn Coho, MD;  Location: [facility] OR;  Service: ENT;  Laterality: Left;   Family History  Problem Relation Age of Onset  . Diabetes Mother   . Cancer Mother   . Hypertension Mother    History  Substance Use Topics  . Smoking status: Never Smoker   . Smokeless tobacco: Never Used  . Alcohol Use: Yes     Comment: occas.   OB History    No data available     Review of Systems  Constitutional: Positive for activity change and appetite change. Negative for chills and fatigue.  HENT: Positive for congestion, postnasal drip, rhinorrhea and sore throat. Negative for facial swelling.   Eyes: Negative.   Respiratory: Positive for cough.   Cardiovascular: Negative.   Gastrointestinal:  Negative.   Genitourinary: Negative.   Musculoskeletal: Negative for neck pain and neck stiffness.  Skin: Negative for pallor and rash.  Neurological: Negative.     Allergies  Review of patient's allergies indicates no known allergies.  Home Medications   Prior to Admission medications   Medication Sig Start Date End Date Taking? Authorizing Provider  albuterol (PROVENTIL HFA;VENTOLIN HFA) 108 (90 BASE) MCG/ACT inhaler Inhale 2 puffs into the lungs every 6 (six) hours as needed for wheezing or shortness of breath.     Historical Provider, MD  clonazePAM (KLONOPIN) 0.5 MG tablet Take 1/2-1 tab po BID prn 04/28/14   Thao P Le, DO  fluticasone (FLONASE) 50 MCG/ACT nasal spray Place into both nostrils daily.    Historical Provider, MD  Fluticasone Furoate-Vilanterol (BREO ELLIPTA) 100-25 MCG/INH AEPB Inhale 1 puff into the lungs daily.    Historical Provider, MD  ipratropium (ATROVENT) 0.06 % nasal spray Place 2 sprays into both nostrils 4 (four) times daily. 07/25/14   Hayden Rasmussen, NP  Multiple Vitamins-Minerals (ONE-A-DAY WOMENS VITACRAVES) CHEW Chew 2 tablets by mouth daily.    Historical Provider, MD  sertraline (ZOLOFT) 25 MG tablet Take 1 tablet (25 mg total) by mouth daily. 04/28/14   Thao P Le, DO   BP 143/97 mmHg  Pulse 90  Temp(Src) 98.4 F (36.9 C) (Oral)  Resp 18  SpO2 100%  LMP  07/22/2014 Physical Exam  Constitutional: She is oriented to person, place, and time. She appears well-developed and well-nourished. No distress.  HENT:  Bilateral TMs are normal Unable to visualize the oropharynx due to patient's tongue retraction inability to allow examiner to visualize the of oropharynx.  Eyes: Conjunctivae and EOM are normal.  Neck: Normal range of motion. Neck supple.  Cardiovascular: Normal rate, regular rhythm and normal heart sounds.   Pulmonary/Chest: Effort normal and breath sounds normal. No respiratory distress. She has no wheezes. She has no rales.  Musculoskeletal:  Normal range of motion. She exhibits no edema.  Lymphadenopathy:    She has no cervical adenopathy.  Neurological: She is alert and oriented to person, place, and time.  Skin: Skin is warm and dry. No rash noted.  Psychiatric: She has a normal mood and affect.  Nursing note and vitals reviewed.   ED Course  Procedures (including critical care time) Labs Review Labs Reviewed - No data to display  Imaging Review No results found.   MDM   1. URI (upper respiratory infection)   2. Laryngitis   3. PND (post-nasal drip)    Continue the use of either NyQuil or Alka-Seltzer cold plus or TheraFlu. Drink plenty of fluids and stay well-hydrated. rest.     Hayden Rasmussenavid Clevon Khader, NP 07/25/14 [phone]

## 2014-10-04 ENCOUNTER — Ambulatory Visit (INDEPENDENT_AMBULATORY_CARE_PROVIDER_SITE_OTHER): Payer: Managed Care, Other (non HMO) | Admitting: Physician Assistant

## 2014-10-04 VITALS — BP 130/84 | HR 101 | Temp 97.5°F | Resp 16 | Ht 65.25 in | Wt 251.0 lb

## 2014-10-04 DIAGNOSIS — F411 Generalized anxiety disorder: Secondary | ICD-10-CM | POA: Diagnosis not present

## 2014-10-04 DIAGNOSIS — F32A Depression, unspecified: Secondary | ICD-10-CM | POA: Insufficient documentation

## 2014-10-04 DIAGNOSIS — F329 Major depressive disorder, single episode, unspecified: Secondary | ICD-10-CM | POA: Diagnosis not present

## 2014-10-04 MED ORDER — SERTRALINE HCL 50 MG PO TABS: 50.0000 mg | ORAL_TABLET | Freq: Every day | ORAL | Status: DC

## 2014-10-04 MED ORDER — CLONAZEPAM 0.5 MG PO TABS: ORAL_TABLET | ORAL | Status: DC

## 2014-10-04 NOTE — Progress Notes (Signed)
Subjective:    Patient ID: Julie Cardenas, female    DOB: 07-30-1985, 29 y.o.   MRN: [id number]  HPI  This is a 29 year old female who is presenting with depression and anxiety. States she has always had some level of depression/anxiety but it never affected her daily functioning until 6 months ago. She sees a therapist weekly that she really likes. 6 months ago she was seen here by Dr. Conley Cardenas and prescribed zoloft 25 mg QD and clonazepam 0.5 mg to take prn anxiety. States she took 4 weeks of zoloft but never refilled because it wasn't helping. She never filled the clonazepam because she wasn't sure what it was for. Reports she is here today because her therapist told her she had to come. Pt has stopped going to classes, stopped going to work and is not going to church anymore. She states she knows things are bad when she doesn't go to church. She has lost interest in doing things she liked to do. She stays in bed most of the day but has trouble sleeping at night. She states she wakes frequently during the day. She tries to take naps during the day most days because "I feel exhausted" but frequently cannot nap d/t her mind racing. She is having panic attacks at least twice a week. She states this makes her asthma act up. She states she feels like she is suffocating. A couple weeks ago she had a fleeting thought about taking all of her medications to kill herself but she did not act on it. This thought scared her - she told her therapist. Her therapist told her if she did not get seen by a medical professional she would get her admitted to the hospital. She does not exercise. She is wary of medication. She hates her job but states it pays well. Thinking of going back to her job gives her anxiety. She has gained 20 pounds in the past 7 months.  Review of Systems  Constitutional: Negative for fever and chills.  Respiratory: Positive for shortness of breath and wheezing. Negative for cough.   Cardiovascular:  Negative for chest pain.  Gastrointestinal: Negative for nausea, vomiting and abdominal pain.  Allergic/Immunologic: Positive for environmental allergies.  Hematological: Negative for adenopathy.  Psychiatric/Behavioral: Positive for suicidal ideas, sleep disturbance, dysphoric mood and decreased concentration. Negative for self-injury. The patient is nervous/anxious.    Patient Active Problem List   Diagnosis Date Noted  . Nasal polyps 09/13/2013  . Sinusitis, chronic 09/13/2013   Prior to Admission medications   Medication Sig Start Date End Date Taking? Authorizing Provider  albuterol (PROVENTIL HFA;VENTOLIN HFA) 108 (90 BASE) MCG/ACT inhaler Inhale 2 puffs into the lungs every 6 (six) hours as needed for wheezing or shortness of breath.    Yes Historical Provider, MD  Fluticasone Furoate-Vilanterol (BREO ELLIPTA) 100-25 MCG/INH AEPB Inhale 1 puff into the lungs daily.   Yes Historical Provider, MD  ipratropium (ATROVENT) 0.06 % nasal spray Place 2 sprays into both nostrils 4 (four) times daily. 07/25/14  Yes Julie Rasmussen, NP  Multiple Vitamins-Minerals (ONE-A-DAY WOMENS VITACRAVES) CHEW Chew 2 tablets by mouth daily.   Yes Historical Provider, MD  sertraline (ZOLOFT) 25 MG tablet Take 1 tablet (25 mg total) by mouth daily. 04/28/14   Julie P Le, DO  clonazePAM (KLONOPIN) 0.5 MG tablet Take 1/2-1 tab po BID prn Patient not taking: Reported on 10/04/2014 04/28/14   Julie P Le, DO   No Known Allergies  Patient's  social and family history were reviewed.     Objective:   Physical Exam  Constitutional: She is oriented to person, place, and time. She appears well-developed and well-nourished. No distress.  HENT:  Head: Normocephalic and atraumatic.  Right Ear: Hearing normal.  Left Ear: Hearing normal.  Nose: Nose normal.  Eyes: Conjunctivae and lids are normal. Right eye exhibits no discharge. Left eye exhibits no discharge. No scleral icterus.  Cardiovascular: Normal rate, regular rhythm,  normal heart sounds and normal pulses.   No murmur heard. Pulmonary/Chest: Effort normal. No respiratory distress. She has wheezes (scattered). She has no rhonchi. She has no rales.  Musculoskeletal: Normal range of motion.  Neurological: She is alert and oriented to person, place, and time.  Skin: Skin is warm, dry and intact. No lesion and no rash noted.  Psychiatric: She has a normal mood and affect. Her speech is normal and behavior is normal. Thought content normal.   BP 130/84 mmHg  Pulse 101  Temp(Src) 97.5 F (36.4 C) (Oral)  Resp 16  Ht 5' 5.25" (1.657 m)  Wt 251 lb (113.853 kg)  BMI 41.47 kg/m2  SpO2 97%  LMP 09/16/2014     Assessment & Plan:  1. Depression 2. GAD Pt will take 25 mg QD x 1 week and then start 50 mg QD thereafter. She will take clonazepam prn panic attacks. She will take melatonin QHS. We discussed the importance of establishing a set bedtime and wake time without daytime naps. We discussed the importance of daily exercise. She will continue weekly therapy appointments. If she has more SI she should go to the ED. She will return for follow up in 6 weeks. - clonazePAM (KLONOPIN) 0.5 MG tablet; Take 1/2-1 tab po BID prn  Dispense: 60 tablet; Refill: 1 - sertraline (ZOLOFT) 50 MG tablet; Take 1 tablet (50 mg total) by mouth daily.  Dispense: 30 tablet; Refill: 3  Julie Cardenas, MHS [facility]  10/04/2014

## 2014-10-04 NOTE — Patient Instructions (Signed)
Take 1/2 tab zoloft x 1 week then increase to full tab daily. May take 1/2-1 tab of clonazepam for anxiety. Take melatonin at night for sleep. Make established times to go to sleep and wake up. Take zoloft in the morning when you wake. Exercise most days, even if that is just a short walk outside. Return to see me in 6 weeks for follow up.

## 2014-11-26 ENCOUNTER — Encounter: Payer: Self-pay | Admitting: Physician Assistant

## 2014-11-26 ENCOUNTER — Ambulatory Visit (INDEPENDENT_AMBULATORY_CARE_PROVIDER_SITE_OTHER): Payer: Managed Care, Other (non HMO) | Admitting: Physician Assistant

## 2014-11-26 VITALS — BP 132/78 | HR 80 | Temp 98.2°F | Resp 16 | Ht 65.5 in | Wt 246.0 lb

## 2014-11-26 DIAGNOSIS — F419 Anxiety disorder, unspecified: Secondary | ICD-10-CM | POA: Diagnosis not present

## 2014-11-26 DIAGNOSIS — F329 Major depressive disorder, single episode, unspecified: Secondary | ICD-10-CM | POA: Diagnosis not present

## 2014-11-26 DIAGNOSIS — F32A Depression, unspecified: Secondary | ICD-10-CM

## 2014-11-26 NOTE — Progress Notes (Signed)
[facility] [address] [place] [address] [id number] [phone]  Date:  11/26/2014   Name:  Julie Cardenas   DOB:  [birth year]   MRN:  [id number]  PCP:  Rose Fillers, PA-C    Chief Complaint: Depression and Anxiety   History of Present Illness:  This is a 29 y.o. female with PMH anxiety and depression who is presenting for follow up.  I first saw pt 8 weeks ago. At that time she was experiencing a change in her day to day functioning from anxiety and depression. She was sleeping all the time and having frequent panic attacks. She had stopped going to work and stopped going to church. She had started seeing a therapist who recommended she be seen to get started on something for depression. She was started on zoloft 25 mg and was to increase to 50 mg after 1 week. Pt reports she never increased to 50 mg - she forgot she was supposed to do that. Pt reports she feels better than 8 weeks ago but still feeling very overwhelmed. She has stopped sleeping during the day. She gets up and gets ready for the day around 6:30 am. Goes to sleep at 9 PM. Sometimes wakes around 2-3 AM but is able to get back to sleep. She is no longer having panic attacks. Pt states her biggest problems now are social and financial. She has a hard time saying no to her family. Her family is continually asking her to help with things - taking care of her mother, taking care of her sisters kids and her cousin is asking if her and her three teenagers can move into her apartment. She states she has no time for herself. 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 It is too stressful. She had a job interview recently but did not get it because her mother had just gotten a job there. She has continued with her therapist but states she feels "her personal life is spilling over into our visits". She states "I think I need a new therapist". She denies  SI/HI.  Review of Systems:  Review of Systems See HPI.   Patient Active Problem List   Diagnosis Date Noted  . Depression 10/04/2014  . Generalized anxiety disorder 10/04/2014  . Nasal polyps 09/13/2013  . Sinusitis, chronic 09/13/2013    Prior to Admission medications   Medication Sig Start Date End Date Taking? Authorizing Provider  albuterol (PROVENTIL HFA;VENTOLIN HFA) 108 (90 BASE) MCG/ACT inhaler Inhale 2 puffs into the lungs every 6 (six) hours as needed for wheezing or shortness of breath.    Yes Historical Provider, MD  clonazePAM (KLONOPIN) 0.5 MG tablet Take 1/2-1 tab po BID prn 10/04/14  Yes Lanier Clam V, PA-C  Fluticasone Furoate-Vilanterol (BREO ELLIPTA) 100-25 MCG/INH AEPB Inhale 1 puff into the lungs daily.   Yes Historical Provider, MD  ipratropium (ATROVENT) 0.06 % nasal spray Place 2 sprays into both nostrils 4 (four) times daily. 07/25/14  Yes Hayden Rasmussen, NP  sertraline (ZOLOFT) 50 MG tablet Take 1 tablet (50 mg total) by mouth daily. 10/04/14  Yes Dorna Leitz, PA-C  Multiple Vitamins-Minerals (ONE-A-DAY WOMENS VITACRAVES) CHEW Chew 2 tablets by mouth daily.    Historical Provider, MD    Not on File  Past Surgical History  Procedure Laterality Date  . Sinus endo w/fusion Right 09/13/2013  Procedure: RIGHT ENDOSCOPIC SINUS SURGERY WITH FUSION SCAN;  Surgeon: Osborn Coho, MD;  Location: [facility] OR;  Service: ENT;  Laterality: Right;  . Sinus endo w/fusion Left 02/01/2014    Procedure: LEFT ENDOSCOPIC SINUS SURGERY/NASAL POLYPECTOMY WITH FUSION;  Surgeon: Osborn Coho, MD;  Location: [facility] OR;  Service: ENT;  Laterality: Left;    History  Substance Use Topics  . Smoking status: Never Smoker   . Smokeless tobacco: Never Used  . Alcohol Use: Yes     Comment: occas.    Family History  Problem Relation Age of Onset  . Diabetes Mother   . Cancer Mother   . Hypertension Mother     Medication list has been reviewed and updated.  Physical  Examination:  Physical Exam  Constitutional: She is oriented to person, place, and time. She appears well-developed and well-nourished. No distress.  HENT:  Head: Normocephalic and atraumatic.  Right Ear: Hearing normal.  Left Ear: Hearing normal.  Nose: Nose normal.  Eyes: Conjunctivae and lids are normal. Right eye exhibits no discharge. Left eye exhibits no discharge. No scleral icterus.  Pulmonary/Chest: Effort normal. No respiratory distress.  Musculoskeletal: Normal range of motion.  Neurological: She is alert and oriented to person, place, and time.  Skin: Skin is warm, dry and intact. No lesion and no rash noted.  Psychiatric: She has a normal mood and affect. Her speech is normal and behavior is normal. Thought content normal.   BP 132/78 mmHg  Pulse 80  Temp(Src) 98.2 F (36.8 C) (Oral)  Resp 16  Ht 5' 5.5" (1.664 m)  Wt 246 lb (111.585 kg)  BMI 40.30 kg/m2  SpO2 100%  LMP 11/05/2014  Assessment and Plan:  1. Depression 2. Anxiety Pt will increase zoloft to 50 mg as she had forgotten to do that before. She has not used klonopin but has on hand to use prn panic attack. Encouraged pt to block off a period of time, possibly 2 weeks, to refocus on herself and say no to family. Encouraged to apply to new jobs, exercise, get good rest, go to church, etc. I have given information on different therapists to call and try. Overall, I am pleased with pt's emotional state even though she is feeling very overwhelmed. She less depressed and anxious compared to 8 weeks ago. She will return in 3-6 months for follow up.   [place] Brackett, MHS [facility]  11/27/2014

## 2014-11-26 NOTE — Patient Instructions (Signed)
For therapy -- [facility] ([address]Beth Coralie CommonKincaid, Ernest McCoy, Heather Joycelyn SchmidKitchens, Karla Wetumpkaownsend) - [phone] [facility] Raynelle Fanning(Julie BalmWhitt) - [phone] [facility] - [phone] [facility] - [phone] (do not file insurance)   Increase zoloft to 50 mg. Take 2 of the 25 mg tabs until you run out. Then pick up the 50 mg tabs. Take klonopin as needed for sleep and anxiety. Take at least 2 weeks to refocus. Say NO to other people and say YES to you! Apply to jobs, exercise, go to church, etc.  Come back to see me in 3-6 months.

## 2014-12-17 ENCOUNTER — Telehealth: Payer: Self-pay

## 2014-12-17 NOTE — Telephone Encounter (Signed)
Patient is calling to leave a message for Joni Reiningicole in regards to depression and anxiety. Please call! [phone]

## 2014-12-17 NOTE — Telephone Encounter (Signed)
Assessment and Plan:  1. Depression 2. Anxiety Pt will increase zoloft to 50 mg as she had forgotten to do that before. She has not used klonopin but has on hand to use prn panic attack. Encouraged pt to block off a period of time, possibly 2 weeks, to refocus on herself and say no to family. Encouraged to apply to new jobs, exercise, get good rest, go to church, etc. I have given information on different therapists to call and try. Overall, I am pleased with pt's emotional state even though she is feeling very overwhelmed. She less depressed and anxious compared to 8 weeks ago. She will return in 3-6 months for follow up.   Roswell MinersNicole V. Dyke BrackettBush, PA-C, [facility]  Left message for pt to call back.

## 2014-12-19 ENCOUNTER — Telehealth: Payer: Self-pay

## 2014-12-19 NOTE — Telephone Encounter (Signed)
PATIENT STATES SHE NEEDS NICOLE BUSH TO WRITE HER A DETAILED LETTER REGARDING HER ILLNESS. 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 PLEASE CALL HER WHEN IT CAN BE PICKED UP. BEST PHONE [phone] (CELL) MBC

## 2014-12-19 NOTE — Telephone Encounter (Signed)
Pt said she needs a general overview of what was discussed when she was in the office regarding her anxiety/depression. Her counselor wrote her a letter, but pt says it contradicted what she had said over the phone so her claim was denied.

## 2014-12-19 NOTE — Telephone Encounter (Signed)
Please see new phone message from today regarding same situation.

## 2014-12-30 ENCOUNTER — Encounter: Payer: Self-pay | Admitting: Physician Assistant

## 2014-12-31 NOTE — Telephone Encounter (Signed)
Letter in pick up draw. Pt notified.

## 2014-12-31 NOTE — Telephone Encounter (Signed)
Please call pt and let her know her letter has been faxed and is ready for her to pick up. Faxed to: [employer] [facility] Fax: [phone] Claim [phone]

## 2015-07-15 ENCOUNTER — Emergency Department (HOSPITAL_COMMUNITY)
Admission: EM | Admit: 2015-07-15 | Discharge: 2015-07-15 | Disposition: A | Discharge status: Home / Self Care | Payer: Managed Care, Other (non HMO) | Attending: Emergency Medicine | Admitting: Emergency Medicine

## 2015-07-15 ENCOUNTER — Emergency Department (HOSPITAL_COMMUNITY): Payer: Managed Care, Other (non HMO)

## 2015-07-15 ENCOUNTER — Encounter (HOSPITAL_COMMUNITY): Payer: Self-pay | Admitting: Emergency Medicine

## 2015-07-15 DIAGNOSIS — J45909 Unspecified asthma, uncomplicated: Secondary | ICD-10-CM | POA: Insufficient documentation

## 2015-07-15 DIAGNOSIS — F419 Anxiety disorder, unspecified: Secondary | ICD-10-CM | POA: Insufficient documentation

## 2015-07-15 DIAGNOSIS — Y990 Civilian activity done for income or pay: Secondary | ICD-10-CM | POA: Insufficient documentation

## 2015-07-15 DIAGNOSIS — Z79899 Other long term (current) drug therapy: Secondary | ICD-10-CM | POA: Insufficient documentation

## 2015-07-15 DIAGNOSIS — S63501A Unspecified sprain of right wrist, initial encounter: Secondary | ICD-10-CM

## 2015-07-15 DIAGNOSIS — Y9389 Activity, other specified: Secondary | ICD-10-CM | POA: Insufficient documentation

## 2015-07-15 DIAGNOSIS — S199XXA Unspecified injury of neck, initial encounter: Secondary | ICD-10-CM | POA: Insufficient documentation

## 2015-07-15 DIAGNOSIS — X58XXXA Exposure to other specified factors, initial encounter: Secondary | ICD-10-CM | POA: Insufficient documentation

## 2015-07-15 DIAGNOSIS — Y9259 Other trade areas as the place of occurrence of the external cause: Secondary | ICD-10-CM | POA: Insufficient documentation

## 2015-07-15 DIAGNOSIS — Z7951 Long term (current) use of inhaled steroids: Secondary | ICD-10-CM | POA: Insufficient documentation

## 2015-07-15 DIAGNOSIS — Z791 Long term (current) use of non-steroidal anti-inflammatories (NSAID): Secondary | ICD-10-CM | POA: Insufficient documentation

## 2015-07-15 MED ORDER — NAPROXEN 500 MG PO TABS: 500.0000 mg | ORAL_TABLET | Freq: Two times a day (BID) | ORAL | Status: DC

## 2015-07-15 NOTE — Discharge Instructions (Signed)
Wrist Sprain With Rehab Follow up with orthopedic hand surgeon. Take naproxen for pain. A sprain is an injury in which a ligament that maintains the proper alignment of a joint is partially or completely torn. The ligaments of the wrist are susceptible to sprains. Sprains are classified into three categories. Grade 1 sprains cause pain, but the tendon is not lengthened. Grade 2 sprains include a lengthened ligament because the ligament is stretched or partially ruptured. With grade 2 sprains there is still function, although the function may be diminished. Grade 3 sprains are characterized by a complete tear of the tendon or muscle, and function is usually impaired. SYMPTOMS   Pain tenderness, inflammation, and/or bruising (contusion) of the injury.  A "pop" or tear felt and/or heard at the time of injury.  Decreased wrist function. CAUSES  A wrist sprain occurs when a force is placed on one or more ligaments that is greater than it/they can withstand. Common mechanisms of injury include:  Catching a ball with your hands.  Repetitive and/ or strenuous extension or flexion of the wrist. RISK INCREASES WITH:  Previous wrist injury.  Contact sports (boxing or wrestling).  Activities in which falling is common.  Poor strength and flexibility.  Improperly fitted or padded protective equipment. PREVENTION  Warm up and stretch properly before activity.  Allow for adequate recovery between workouts.  Maintain physical fitness:  Strength, flexibility, and endurance.  Cardiovascular fitness.  Protect the wrist joint by limiting its motion with the use of taping, braces, or splints.  Protect the wrist after injury for 6 to 12 months. PROGNOSIS  The prognosis for wrist sprains depends on the degree of injury. Grade 1 sprains require 2 to 6 weeks of treatment. Grade 2 sprains require 6 to 8 weeks of treatment, and grade 3 sprains require up to 12 weeks.  RELATED COMPLICATIONS    Prolonged healing time, if improperly treated or re-injured.  Recurrent symptoms that result in a chronic problem.  Injury to nearby structures (bone, cartilage, nerves, or tendons).  Arthritis of the wrist.  Inability to compete in athletics at a high level.  Wrist stiffness or weakness.  Progression to a complete rupture of the ligament. TREATMENT  Treatment initially involves resting from any activities that aggravate the symptoms, and the use of ice and medications to help reduce pain and inflammation. Your caregiver may recommend immobilizing the wrist for a period of time in order to reduce stress on the ligament and allow for healing. After immobilization it is important to perform strengthening and stretching exercises to help regain strength and a full range of motion. These exercises may be completed at home or with a therapist. Surgery is not usually required for wrist sprains, unless the ligament has been ruptured (grade 3 sprain). MEDICATION   If pain medication is necessary, then nonsteroidal anti-inflammatory medications, such as aspirin and ibuprofen, or other minor pain relievers, such as acetaminophen, are often recommended.  Do not take pain medication for 7 days before surgery.  Prescription pain relievers may be given if deemed necessary by your caregiver. Use only as directed and only as much as you need. HEAT AND COLD  Cold treatment (icing) relieves pain and reduces inflammation. Cold treatment should be applied for 10 to 15 minutes every 2 to 3 hours for inflammation and pain and immediately after any activity that aggravates your symptoms. Use ice packs or massage the area with a piece of ice (ice massage).  Heat treatment may be used prior  to performing the stretching and strengthening activities prescribed by your caregiver, physical therapist, or athletic trainer. Use a heat pack or soak your injury in warm water. SEEK MEDICAL CARE IF:  Treatment seems to  offer no benefit, or the condition worsens.  Any medications produce adverse side effects. EXERCISES RANGE OF MOTION (ROM) AND STRETCHING EXERCISES - Wrist Sprain  These exercises may help you when beginning to rehabilitate your injury. Your symptoms may resolve with or without further involvement from your physician, physical therapist or athletic trainer. While completing these exercises, remember:   Restoring tissue flexibility helps normal motion to return to the joints. This allows healthier, less painful movement and activity.  An effective stretch should be held for at least 30 seconds.  A stretch should never be painful. You should only feel a gentle lengthening or release in the stretched tissue. RANGE OF MOTION - Wrist Flexion, Active-Assisted  Extend your right / left elbow with your fingers pointing down.*  Gently pull the back of your hand towards you until you feel a gentle stretch on the top of your forearm.  Hold this position for __________ seconds. Repeat __________ times. Complete this exercise __________ times per day.  *If directed by your physician, physical therapist or athletic trainer, complete this stretch with your elbow bent rather than extended. RANGE OF MOTION - Wrist Extension, Active-Assisted  Extend your right / left elbow and turn your palm upwards.*  Gently pull your palm/fingertips back so your wrist extends and your fingers point more toward the ground.  You should feel a gentle stretch on the inside of your forearm.  Hold this position for __________ seconds. Repeat __________ times. Complete this exercise __________ times per day. *If directed by your physician, physical therapist or athletic trainer, complete this stretch with your elbow bent, rather than extended. RANGE OF MOTION - Supination, Active  Stand or sit with your elbows at your side. Bend your right / left elbow to 90 degrees.  Turn your palm upward until you feel a gentle  stretch on the inside of your forearm.  Hold this position for __________ seconds. Slowly release and return to the starting position. Repeat __________ times. Complete this stretch __________ times per day.  RANGE OF MOTION - Pronation, Active  Stand or sit with your elbows at your side. Bend your right / left elbow to 90 degrees.  Turn your palm downward until you feel a gentle stretch on the top of your forearm.  Hold this position for __________ seconds. Slowly release and return to the starting position. Repeat __________ times. Complete this stretch __________ times per day.  STRETCH - Wrist Flexion  Place the back of your right / left hand on a tabletop leaving your elbow slightly bent. Your fingers should point away from your body.  Gently press the back of your hand down onto the table by straightening your elbow. You should feel a stretch on the top of your forearm.  Hold this position for __________ seconds. Repeat __________ times. Complete this stretch __________ times per day.  STRETCH - Wrist Extension  Place your right / left fingertips on a tabletop leaving your elbow slightly bent. Your fingers should point backwards.  Gently press your fingers and palm down onto the table by straightening your elbow. You should feel a stretch on the inside of your forearm.  Hold this position for __________ seconds. Repeat __________ times. Complete this stretch __________ times per day.  STRENGTHENING EXERCISES - Wrist Sprain These  exercises may help you when beginning to rehabilitate your injury. They may resolve your symptoms with or without further involvement from your physician, physical therapist or athletic trainer. While completing these exercises, remember:   Muscles can gain both the endurance and the strength needed for everyday activities through controlled exercises.  Complete these exercises as instructed by your physician, physical therapist or athletic trainer.  Progress with the resistance and repetition exercises only as your caregiver advises. STRENGTH - Wrist Flexors  Sit with your right / left forearm palm-up and fully supported. Your elbow should be resting below the height of your shoulder. Allow your wrist to extend over the edge of the surface.  Loosely holding a __________ weight or a piece of rubber exercise band/tubing, slowly curl your hand up toward your forearm.  Hold this position for __________ seconds. Slowly lower the wrist back to the starting position in a controlled manner. Repeat __________ times. Complete this exercise __________ times per day.  STRENGTH - Wrist Extensors  Sit with your right / left forearm palm-down and fully supported. Your elbow should be resting below the height of your shoulder. Allow your wrist to extend over the edge of the surface.  Loosely holding a __________ weight or a piece of rubber exercise band/tubing, slowly curl your hand up toward your forearm.  Hold this position for __________ seconds. Slowly lower the wrist back to the starting position in a controlled manner. Repeat __________ times. Complete this exercise __________ times per day.  STRENGTH - Ulnar Deviators  Stand with a ____________________ weight in your right / left hand, or sit holding on to the rubber exercise band/tubing with your opposite arm supported.  Move your wrist so that your pinkie travels toward your forearm and your thumb moves away from your forearm.  Hold this position for __________ seconds and then slowly lower the wrist back to the starting position. Repeat __________ times. Complete this exercise __________ times per day STRENGTH - Radial Deviators  Stand with a ____________________ weight in your  right / left hand, or sit holding on to the rubber exercise band/tubing with your arm supported.  Raise your hand upward in front of you or pull up on the rubber tubing.  Hold this position for __________  seconds and then slowly lower the wrist back to the starting position. Repeat __________ times. Complete this exercise __________ times per day. STRENGTH - Forearm Supinators  Sit with your right / left forearm supported on a table, keeping your elbow below shoulder height. Rest your hand over the edge, palm down.  Gently grip a hammer or a soup ladle.  Without moving your elbow, slowly turn your palm and hand upward to a "thumbs-up" position.  Hold this position for __________ seconds. Slowly return to the starting position. Repeat __________ times. Complete this exercise __________ times per day.  STRENGTH - Forearm Pronators  Sit with your right / left forearm supported on a table, keeping your elbow below shoulder height. Rest your hand over the edge, palm up.  Gently grip a hammer or a soup ladle.  Without moving your elbow, slowly turn your palm and hand upward to a "thumbs-up" position.  Hold this position for __________ seconds. Slowly return to the starting position. Repeat __________ times. Complete this exercise __________ times per day.  STRENGTH - Grip  Grasp a tennis ball, a dense sponge, or a large, rolled sock in your hand.  Squeeze as hard as you can without increasing any pain.  Hold this position for __________ seconds. Release your grip slowly. Repeat __________ times. Complete this exercise __________ times per day.    This information is not intended to replace advice given to you by your health care provider. Make sure you discuss any questions you have with your health care provider.   Document Released: 05/17/2005 Document Revised: 02/05/2015 Document Reviewed: 08/29/2008 Elsevier Interactive Patient Education [employer]

## 2015-07-15 NOTE — ED Notes (Signed)
Pt report lifting a heavy box at work and felt "something tear" and has pain for the past 2 weeks. Pain is worse and she is now feeling the pain up her arm to her shoulder.

## 2015-07-15 NOTE — ED Notes (Signed)
Ortho tec called for splint 

## 2015-07-15 NOTE — ED Provider Notes (Signed)
CSN: [id number]     Arrival date & time 07/15/15  1226 History  By signing my name below, I, Julie Cardenas, attest that this documentation has been prepared under the direction and in the presence of [employer], PA-C.  Electronically Signed: Murriel Cardenas, ED Scribe. 07/15/2015. 1:57 PM.   Chief Complaint  Patient presents with  . Wrist Pain    The history is provided by the patient. No language interpreter was used.   HPI Comments: Julie Cardenas is a 30 y.o. female who presents to the Emergency Department complaining of constant, worsening right hand and wrist pain that radiates from her right thumb up to her right shoulder that has been present for two weeks. Pt states she has not taken anything or done anything to treat her pain because she was told she cannot take Aspirin as an asthmatic. Pt states she works in [employer] at [employer], and [employer] all day. Pt states she is right handed. Denies any numbness or tingling.  Past Medical History  Diagnosis Date  . Seasonal allergies   . Asthma   . Bronchitis   . Anxiety     no medicine, prayers aid her   . Headache(784.0)     sinus related  . Leg cramps     PCP- advised her to exercise   . Family history of anesthesia complication     mother slow waking up  . Shortness of breath    Past Surgical History  Procedure Laterality Date  . Sinus endo w/fusion Right 09/13/2013    Procedure: RIGHT ENDOSCOPIC SINUS SURGERY WITH FUSION SCAN;  Surgeon: Osborn Coho, MD;  Location: [facility] OR;  Service: ENT;  Laterality: Right;  . Sinus endo w/fusion Left 02/01/2014    Procedure: LEFT ENDOSCOPIC SINUS SURGERY/NASAL POLYPECTOMY WITH FUSION;  Surgeon: Osborn Coho, MD;  Location: [facility] OR;  Service: ENT;  Laterality: Left;   Family History  Problem Relation Age of Onset  . Diabetes Mother   . Cancer Mother   . Hypertension Mother    Social History  Substance Use Topics  . Smoking status: Never Smoker   . Smokeless tobacco: Never Used   . Alcohol Use: Yes     Comment: occas.   OB History    No data available     Review of Systems  Musculoskeletal: Positive for arthralgias and neck pain.  Skin: Negative for color change and wound.  Neurological: Positive for weakness. Negative for numbness.      Allergies  Review of patient's allergies indicates no known allergies.  Home Medications   Prior to Admission medications   Medication Sig Start Date End Date Taking? Authorizing Provider  albuterol (PROVENTIL HFA;VENTOLIN HFA) 108 (90 BASE) MCG/ACT inhaler Inhale 2 puffs into the lungs every 6 (six) hours as needed for wheezing or shortness of breath.     Historical Provider, MD  clonazePAM (KLONOPIN) 0.5 MG tablet Take 1/2-1 tab po BID prn 10/04/14   Lanier Clam V, PA-C  Fluticasone Furoate-Vilanterol (BREO ELLIPTA) 100-25 MCG/INH AEPB Inhale 1 puff into the lungs daily.    Historical Provider, MD  ipratropium (ATROVENT) 0.06 % nasal spray Place 2 sprays into both nostrils 4 (four) times daily. 07/25/14   Hayden Rasmussen, NP  Multiple Vitamins-Minerals (ONE-A-DAY WOMENS VITACRAVES) CHEW Chew 2 tablets by mouth daily.    Historical Provider, MD  naproxen (NAPROSYN) 500 MG tablet Take 1 tablet (500 mg total) by mouth 2 (two) times daily with a meal. 07/15/15  Gunnar Hereford Patel-Mills, PA-C  sertraline (ZOLOFT) 50 MG tablet Take 1 tablet (50 mg total) by mouth daily. 10/04/14   Dorna Leitz, PA-C   BP 134/94 mmHg  Pulse 70  Temp(Src) 98.2 F (36.8 C) (Oral)  Resp 16  SpO2 100%  LMP 06/25/2015 Physical Exam  Constitutional: She is oriented to person, place, and time. She appears well-developed and well-nourished.  HENT:  Head: Normocephalic and atraumatic.  Eyes: Conjunctivae are normal.  Neck: Normal range of motion. Neck supple.  Cardiovascular: Normal rate.   Pulmonary/Chest: Effort normal. No respiratory distress.  Musculoskeletal:  Right wrist: Able to flex and extend the wrist. Tenderness along the distal radius. No  obvious deformity. Able to flex and extend all fingers. No snuffbox tenderness. Able to make okay sign. Able to flex and extend at the elbow. Less than 2 seconds capillary refill. 2+ radial pulse.  Neurological: She is alert and oriented to person, place, and time.  Skin: Skin is warm and dry.  Nursing note and vitals reviewed.   ED Course  Procedures (including critical care time)  DIAGNOSTIC STUDIES: Oxygen Saturation is 98% on room air, normal by my interpretation.    COORDINATION OF CARE: 1:56 PM Discussed treatment plan with pt at bedside and pt agreed to plan.   Labs Review Labs Reviewed - No data to display  Imaging Review Dg Wrist Complete Right  07/15/2015  CLINICAL DATA:  Right thumb pain after picking up heavy box 2 weeks ago. Initial encounter. EXAM: RIGHT WRIST - COMPLETE 3+ VIEW COMPARISON:  July 07, 2005. FINDINGS: There is no evidence of fracture or dislocation. There is no evidence of arthropathy or other focal bone abnormality. Soft tissues are unremarkable. IMPRESSION: Normal right wrist. Electronically Signed   By: Julie Cardenas, M.D.   On: 07/15/2015 13:58   I have personally reviewed and evaluated these images results as part of my medical decision-making.   EKG Interpretation None      MDM   Final diagnoses:  Wrist sprain, right, initial encounter   Patient presents for right wrist pain in boxes at work 2 weeks ago. Denies treatment prior to arrival. No obvious deformity. X-ray results are negative for acute fracture dislocation. Exam not worrisome or tendon rupture. Patient was given naproxen. I discussed follow-up with orthopedic hand surgeon. Patient agrees with plan. I personally performed the services described in this documentation, which was scribed in my presence. The recorded information has been reviewed and is accurate.  Medications - No data to display Filed Vitals:   07/15/15 1315 07/15/15 1438  BP: 134/94   Pulse: 80 70  Temp: 98.2  F (36.8 C)   Resp: [address] PA-C 07/15/15 1633  Rolland Porter, MD 07/23/15 [id number]

## 2015-07-31 ENCOUNTER — Encounter (HOSPITAL_COMMUNITY): Payer: Self-pay

## 2015-07-31 ENCOUNTER — Emergency Department (HOSPITAL_COMMUNITY)
Admission: EM | Admit: 2015-07-31 | Discharge: 2015-07-31 | Disposition: A | Discharge status: Home / Self Care | Payer: Managed Care, Other (non HMO) | Attending: Emergency Medicine | Admitting: Emergency Medicine

## 2015-07-31 DIAGNOSIS — R197 Diarrhea, unspecified: Secondary | ICD-10-CM

## 2015-07-31 DIAGNOSIS — Z791 Long term (current) use of non-steroidal anti-inflammatories (NSAID): Secondary | ICD-10-CM | POA: Insufficient documentation

## 2015-07-31 DIAGNOSIS — R1033 Periumbilical pain: Secondary | ICD-10-CM | POA: Insufficient documentation

## 2015-07-31 DIAGNOSIS — F419 Anxiety disorder, unspecified: Secondary | ICD-10-CM | POA: Insufficient documentation

## 2015-07-31 DIAGNOSIS — J45909 Unspecified asthma, uncomplicated: Secondary | ICD-10-CM | POA: Insufficient documentation

## 2015-07-31 DIAGNOSIS — Z79899 Other long term (current) drug therapy: Secondary | ICD-10-CM | POA: Insufficient documentation

## 2015-07-31 DIAGNOSIS — Z3202 Encounter for pregnancy test, result negative: Secondary | ICD-10-CM | POA: Insufficient documentation

## 2015-07-31 DIAGNOSIS — Z7951 Long term (current) use of inhaled steroids: Secondary | ICD-10-CM | POA: Insufficient documentation

## 2015-07-31 LAB — CBC WITH DIFFERENTIAL/PLATELET
Basophils Absolute: 0 K/uL (ref 0.0–0.1)
Basophils Relative: 0 %
Eosinophils Absolute: 0.2 K/uL (ref 0.0–0.7)
Eosinophils Relative: 3 %
HCT: 45.3 % (ref 36.0–46.0)
Hemoglobin: 14.1 g/dL (ref 12.0–15.0)
Lymphocytes Relative: 16 %
Lymphs Abs: 1.2 K/uL (ref 0.7–4.0)
MCH: 26.5 pg (ref 26.0–34.0)
MCHC: 31.1 g/dL (ref 30.0–36.0)
MCV: 85 fL (ref 78.0–100.0)
Monocytes Absolute: 0.4 K/uL (ref 0.1–1.0)
Monocytes Relative: 5 %
Neutro Abs: 5.9 K/uL (ref 1.7–7.7)
Neutrophils Relative %: 76 %
Platelets: 318 K/uL (ref 150–400)
RBC: 5.33 MIL/uL — ABNORMAL HIGH (ref 3.87–5.11)
RDW: 15 % (ref 11.5–15.5)
WBC: 7.8 K/uL (ref 4.0–10.5)

## 2015-07-31 LAB — URINALYSIS, ROUTINE W REFLEX MICROSCOPIC
Bilirubin Urine: NEGATIVE
Glucose, UA: NEGATIVE mg/dL
Hgb urine dipstick: NEGATIVE
Ketones, ur: NEGATIVE mg/dL
Leukocytes, UA: NEGATIVE
Nitrite: NEGATIVE
Protein, ur: NEGATIVE mg/dL
Specific Gravity, Urine: 1.018 (ref 1.005–1.030)
pH: 5 (ref 5.0–8.0)

## 2015-07-31 LAB — COMPREHENSIVE METABOLIC PANEL WITH GFR
ALT: 16 U/L (ref 14–54)
AST: 18 U/L (ref 15–41)
Albumin: 4.2 g/dL (ref 3.5–5.0)
Alkaline Phosphatase: 78 U/L (ref 38–126)
Anion gap: 11 (ref 5–15)
BUN: <5 mg/dL — ABNORMAL LOW (ref 6–20)
CO2: 21 mmol/L — ABNORMAL LOW (ref 22–32)
Calcium: 9.3 mg/dL (ref 8.9–10.3)
Chloride: 110 mmol/L (ref 101–111)
Creatinine, Ser: 0.69 mg/dL (ref 0.44–1.00)
GFR calc Af Amer: >60 mL/min
GFR calc non Af Amer: >60 mL/min
Glucose, Bld: 85 mg/dL (ref 65–99)
Potassium: 4.4 mmol/L (ref 3.5–5.1)
Sodium: 142 mmol/L (ref 135–145)
Total Bilirubin: 0.9 mg/dL (ref 0.3–1.2)
Total Protein: 9.1 g/dL — ABNORMAL HIGH (ref 6.5–8.1)

## 2015-07-31 LAB — LIPASE, BLOOD: Lipase: 27 U/L (ref 11–51)

## 2015-07-31 LAB — POC URINE PREG, ED: Preg Test, Ur: NEGATIVE

## 2015-07-31 MED ORDER — ONDANSETRON 4 MG PO TBDP: 4.0000 mg | ORAL_TABLET | Freq: Three times a day (TID) | ORAL | Status: DC | PRN

## 2015-07-31 MED ORDER — DICYCLOMINE HCL 20 MG PO TABS: 20.0000 mg | ORAL_TABLET | Freq: Two times a day (BID) | ORAL | Status: DC

## 2015-07-31 MED ORDER — SODIUM CHLORIDE 0.9 % IV BOLUS (SEPSIS)
1000.0000 mL | Freq: Once | INTRAVENOUS | Status: AC
  Administered 2015-07-31: 1000 mL via INTRAVENOUS

## 2015-07-31 MED ORDER — ONDANSETRON HCL 4 MG/2ML IJ SOLN
4.0000 mg | Freq: Once | INTRAMUSCULAR | Status: AC
  Administered 2015-07-31: 4 mg via INTRAVENOUS
  Filled 2015-07-31: qty 2

## 2015-07-31 MED ORDER — KETOROLAC TROMETHAMINE 30 MG/ML IJ SOLN: 30.0000 mg | Freq: Once | INTRAMUSCULAR | Status: DC

## 2015-07-31 NOTE — ED Notes (Signed)
Patient reports that she was prescribed Naproxyn 500 mg 2 weeks ago and approx 2 days later she began having generalized abdominal pain. Patient states she kept taking the naproxyn until gone. Patient states she developed diarrhea and nausea 2 days ago. Patient denies any blood in her diarrhea.

## 2015-07-31 NOTE — Discharge Instructions (Signed)
Diarrhea Diarrhea is frequent loose and watery bowel movements. It can cause you to feel weak and dehydrated. Dehydration can cause you to become tired and thirsty, have a dry mouth, and have decreased urination that often is dark yellow. Diarrhea is a sign of another problem, most often an infection that will not last long. In most cases, diarrhea typically lasts 2-3 days. However, it can last longer if it is a sign of something more serious. It is important to treat your diarrhea as directed by your caregiver to lessen or prevent future episodes of diarrhea. CAUSES  Some common causes include:  Gastrointestinal infections caused by viruses, bacteria, or parasites.  Food poisoning or food allergies.  Certain medicines, such as antibiotics, chemotherapy, and laxatives.  Artificial sweeteners and fructose.  Digestive disorders. HOME CARE INSTRUCTIONS  Ensure adequate fluid intake (hydration): Have 1 cup (8 oz) of fluid for each diarrhea episode. Avoid fluids that contain simple sugars or sports drinks, fruit juices, whole milk products, and sodas. Your urine should be clear or pale yellow if you are drinking enough fluids. Hydrate with an oral rehydration solution that you can purchase at pharmacies, retail stores, and online. You can prepare an oral rehydration solution at home by mixing the following ingredients together:   - tsp table salt.   tsp baking soda.   tsp salt substitute containing potassium chloride.  1  tablespoons sugar.  1 L (34 oz) of water.  Certain foods and beverages may increase the speed at which food moves through the gastrointestinal (GI) tract. These foods and beverages should be avoided and include:  Caffeinated and alcoholic beverages.  High-fiber foods, such as raw fruits and vegetables, nuts, seeds, and whole grain breads and cereals.  Foods and beverages sweetened with sugar alcohols, such as xylitol, sorbitol, and mannitol.  Some foods may be well  tolerated and may help thicken stool including:  Starchy foods, such as rice, toast, pasta, low-sugar cereal, oatmeal, grits, baked potatoes, crackers, and bagels.  Bananas.  Applesauce.  Add probiotic-rich foods to help increase healthy bacteria in the GI tract, such as yogurt and fermented milk products.  Wash your hands well after each diarrhea episode.  Only take over-the-counter or prescription medicines as directed by your caregiver.  Take a warm bath to relieve any burning or pain from frequent diarrhea episodes. SEEK IMMEDIATE MEDICAL CARE IF:   You are unable to keep fluids down.  You have persistent vomiting.  You have blood in your stool, or your stools are black and tarry.  You do not urinate in 6-8 hours, or there is only a small amount of very dark urine.  You have abdominal pain that increases or localizes.  You have weakness, dizziness, confusion, or light-headedness.  You have a severe headache.  Your diarrhea gets worse or does not get better.  You have a fever or persistent symptoms for more than 2-3 days.  You have a fever and your symptoms suddenly get worse. MAKE SURE YOU:   Understand these instructions.  Will watch your condition.  Will get help right away if you are not doing well or get worse.   This information is not intended to replace advice given to you by your health care provider. Make sure you discuss any questions you have with your health care provider.  Follow up with your primary care provider if your symptoms do not improve. Take zofran as needed for nausea. Encourage bland diet, advance as symptoms improve. Return to the  ED if you experience severe worsening of your symptoms, fever, blood in stool, blood in vomit, severe increase in abdominal pain.

## 2015-07-31 NOTE — ED Notes (Signed)
Unable to obtain enough blood from IV when started . ED phlebotomist notified.

## 2015-07-31 NOTE — ED Provider Notes (Signed)
CSN: [id number]     Arrival date & time 07/31/15  1324 History   First MD Initiated Contact with Patient 07/31/15 [phone]     No chief complaint on file.    (Consider location/radiation/quality/duration/timing/severity/associated sxs/prior Treatment) HPI   Julie Cardenas is a 30 y.o F with past medical history of asthma who presents to the emergency department today complaining of abdominal pain and diarrhea. Patient states that 2 weeks ago she was prescribed naproxen for wrist pain. Since she has been taking this medication she has developed periumbilical crampy abdominal pain. Patient states the pain has continued to worsen over the last 2 weeks. Patient continued to take the naproxen, last dose was 2 days ago. Over the last 2 days patient has had upwards of 20 episodes of watery diarrhea per day as well as nausea. No vomiting. Patient denies fever, melena, hematochezia, back pain, headache, dizziness, syncope, chest pain, shortness of breath, hematemesis.  Past Medical History  Diagnosis Date  . Seasonal allergies   . Asthma   . Bronchitis   . Anxiety     no medicine, prayers aid her   . Headache(784.0)     sinus related  . Leg cramps     PCP- advised her to exercise   . Family history of anesthesia complication     mother slow waking up  . Shortness of breath    Past Surgical History  Procedure Laterality Date  . Sinus endo w/fusion Right 09/13/2013    Procedure: RIGHT ENDOSCOPIC SINUS SURGERY WITH FUSION SCAN;  Surgeon: Osborn Coho, MD;  Location: [facility] OR;  Service: ENT;  Laterality: Right;  . Sinus endo w/fusion Left 02/01/2014    Procedure: LEFT ENDOSCOPIC SINUS SURGERY/NASAL POLYPECTOMY WITH FUSION;  Surgeon: Osborn Coho, MD;  Location: [facility] OR;  Service: ENT;  Laterality: Left;  . Sinus polyps removed     Family History  Problem Relation Age of Onset  . Diabetes Mother   . Cancer Mother   . Hypertension Mother    Social History  Substance Use Topics  . Smoking status:  Never Smoker   . Smokeless tobacco: Never Used  . Alcohol Use: Yes     Comment: occas.   OB History    No data available     Review of Systems  All other systems reviewed and are negative.     Allergies  Review of patient's allergies indicates no known allergies.  Home Medications   Prior to Admission medications   Medication Sig Start Date End Date Taking? Authorizing Provider  albuterol (PROVENTIL HFA;VENTOLIN HFA) 108 (90 BASE) MCG/ACT inhaler Inhale 2 puffs into the lungs every 6 (six) hours as needed for wheezing or shortness of breath.    Yes Historical Provider, MD  clonazePAM (KLONOPIN) 0.5 MG tablet Take 1/2-1 tab po BID prn Patient taking differently: Take 0.25-0.5 mg by mouth 2 (two) times daily as needed for anxiety.  10/04/14  Yes Lanier Clam V, PA-C  Fluticasone Furoate-Vilanterol (BREO ELLIPTA) 100-25 MCG/INH AEPB Inhale 1 puff into the lungs daily.   Yes Historical Provider, MD  naproxen (NAPROSYN) 500 MG tablet Take 1 tablet (500 mg total) by mouth 2 (two) times daily with a meal. 07/15/15  Yes Hanna Patel-Mills, PA-C  sertraline (ZOLOFT) 50 MG tablet Take 1 tablet (50 mg total) by mouth daily. 10/04/14  Yes Nicole Bush V, PA-C   BP 127/54 mmHg  Pulse 102  Temp(Src) 98.2 F (36.8 C) (Oral)  Resp 15  SpO2 100%  LMP  06/25/2015 Physical Exam  Constitutional: She is oriented to person, place, and time. She appears well-developed and well-nourished. No distress.  HENT:  Head: Normocephalic and atraumatic.  Mouth/Throat: No oropharyngeal exudate.  Eyes: Conjunctivae and EOM are normal. Pupils are equal, round, and reactive to light. Right eye exhibits no discharge. Left eye exhibits no discharge. No scleral icterus.  Cardiovascular: Normal rate, regular rhythm, normal heart sounds and intact distal pulses.  Exam reveals no gallop and no friction rub.   No murmur heard. Pulmonary/Chest: Effort normal and breath sounds normal. No respiratory distress. She has no  wheezes. She has no rales. She exhibits no tenderness.  Abdominal: Soft. Bowel sounds are normal. She exhibits no distension and no mass. There is no tenderness. There is no rebound and no guarding.  No peritoneal signs.  Musculoskeletal: Normal range of motion. She exhibits no edema.  Neurological: She is alert and oriented to person, place, and time.  Skin: Skin is warm and dry. No rash noted. She is not diaphoretic. No erythema. No pallor.  Psychiatric: She has a normal mood and affect. Her behavior is normal.  Nursing note and vitals reviewed.   ED Course  Procedures (including critical care time) Labs Review Labs Reviewed  CBC WITH DIFFERENTIAL/PLATELET - Abnormal; Notable for the following:    RBC 5.33 (*)    All other components within normal limits  COMPREHENSIVE METABOLIC PANEL - Abnormal; Notable for the following:    CO2 21 (*)    BUN <5 (*)    Total Protein 9.1 (*)    All other components within normal limits  LIPASE, BLOOD  URINALYSIS, ROUTINE W REFLEX MICROSCOPIC (NOT AT [facility])  POC URINE PREG, ED    Imaging Review No results found. I have personally reviewed and evaluated these images and lab results as part of my medical decision-making.   EKG Interpretation None      MDM   Final diagnoses:  Diarrhea, unspecified type     Otherwise healthy 30 year old female presents to the ED today complaining of abdominal pain and diarrhea. Patient has been experiencing abdominal pain since taking naproxen for wrist pain 3 weeks ago. Diarrhea initiated 2 days ago after stopping the naproxen. No melena or hematochezia. Patient appears well in ED, nontoxic, nonseptic appearing. Abdomen is soft and nontender to palpation. All vitals are stable. All lab work within normal limits. No leukocytosis. UA negative for infection. Liver and renal function preserved. Lipase WNL. Patient given IV fluids and Zofran. Patient reports significant symptomatic improvement after intervention.  Patient is now tolerating by mouth in ED, >6oz without nausea or vomiting. No further episodes of diarrhea while in ED. No sign of appendicitis, diverticulitis, cholecystitis or other acute abdomen. No recent antibiotic use. Encourage patient to stop taking naproxen. Will DC home with symptomatic treatment including Bentyl and Zofran. Patient may take OTC Imodium as needed for diarrhea. Strict return precautions given and discussed. Patient is hemodynamically stable and ready for discharge.    Lester Kinsman McCoole, PA-C 08/01/15 1224  Raeford Razor, MD 08/03/15 [phone]

## 2015-08-24 ENCOUNTER — Encounter (HOSPITAL_COMMUNITY): Payer: Self-pay | Admitting: Emergency Medicine

## 2015-08-24 ENCOUNTER — Emergency Department (HOSPITAL_COMMUNITY)
Admission: EM | Admit: 2015-08-24 | Discharge: 2015-08-24 | Disposition: A | Discharge status: Home / Self Care | Payer: Managed Care, Other (non HMO) | Attending: Emergency Medicine | Admitting: Emergency Medicine

## 2015-08-24 DIAGNOSIS — J4520 Mild intermittent asthma, uncomplicated: Secondary | ICD-10-CM

## 2015-08-24 DIAGNOSIS — J4521 Mild intermittent asthma with (acute) exacerbation: Secondary | ICD-10-CM | POA: Insufficient documentation

## 2015-08-24 DIAGNOSIS — F419 Anxiety disorder, unspecified: Secondary | ICD-10-CM | POA: Insufficient documentation

## 2015-08-24 DIAGNOSIS — Z7951 Long term (current) use of inhaled steroids: Secondary | ICD-10-CM | POA: Insufficient documentation

## 2015-08-24 DIAGNOSIS — Z79899 Other long term (current) drug therapy: Secondary | ICD-10-CM | POA: Insufficient documentation

## 2015-08-24 MED ORDER — PREDNISONE 20 MG PO TABS: ORAL_TABLET | ORAL | Status: DC

## 2015-08-24 MED ORDER — ALBUTEROL SULFATE (2.5 MG/3ML) 0.083% IN NEBU
5.0000 mg | INHALATION_SOLUTION | Freq: Once | RESPIRATORY_TRACT | Status: AC
  Administered 2015-08-24: 5 mg via RESPIRATORY_TRACT
  Filled 2015-08-24: qty 6

## 2015-08-24 MED ORDER — PREDNISONE 20 MG PO TABS
60.0000 mg | ORAL_TABLET | Freq: Once | ORAL | Status: AC
  Administered 2015-08-24: 60 mg via ORAL
  Filled 2015-08-24: qty 3

## 2015-08-24 MED ORDER — IPRATROPIUM BROMIDE 0.02 % IN SOLN
0.5000 mg | Freq: Once | RESPIRATORY_TRACT | Status: AC
  Administered 2015-08-24: 0.5 mg via RESPIRATORY_TRACT
  Filled 2015-08-24: qty 2.5

## 2015-08-24 MED ORDER — FLUTICASONE FUROATE-VILANTEROL 100-25 MCG/INH IN AEPB: 1.0000 | INHALATION_SPRAY | Freq: Every day | RESPIRATORY_TRACT | Status: DC

## 2015-08-24 MED ORDER — ALBUTEROL SULFATE HFA 108 (90 BASE) MCG/ACT IN AERS
2.0000 | INHALATION_SPRAY | RESPIRATORY_TRACT | Status: DC | PRN
  Administered 2015-08-24: 2 via RESPIRATORY_TRACT
  Filled 2015-08-24: qty 6.7

## 2015-08-24 NOTE — ED Provider Notes (Signed)
CSN: [id number]     Arrival date & time 08/24/15  1918 History   First MD Initiated Contact with Patient 08/24/15 1941     Chief Complaint  Patient presents with  . Asthma     (Consider location/radiation/quality/duration/timing/severity/associated sxs/prior Treatment) Patient is a 30 y.o. female presenting with asthma. The history is provided by the patient and medical records.  Asthma Associated symptoms include coughing.     30 year old female with history of asthma, bronchitis, seasonal allergies, anxiety, presenting to the ED for asthma exacerbation. Patient reports her asthma has been bothering her for the past week. She states a few weeks ago she had a cold, this mostly seems to have resolved. She reports changes in season, specifically springtime, triggers her asthma. She generally takes Breo and albuterol but has run out of them 2 weeks ago.  She states she called to have them refilled, however prescriptions have not been sent.  She states no one from the doctors office has called her back.  No fever, chills, sweats.  No sick contacts.  No admission for asthma in the past. Patient was given nebulizer treatment the waiting room with some improvement of her symptoms. Reports she continues to feel some wheezing.   Past Medical History  Diagnosis Date  . Seasonal allergies   . Asthma   . Bronchitis   . Anxiety     no medicine, prayers aid her   . Headache(784.0)     sinus related  . Leg cramps     PCP- advised her to exercise   . Family history of anesthesia complication     mother slow waking up  . Shortness of breath    Past Surgical History  Procedure Laterality Date  . Sinus endo w/fusion Right 09/13/2013    Procedure: RIGHT ENDOSCOPIC SINUS SURGERY WITH FUSION SCAN;  Surgeon: Osborn Coho, MD;  Location: [facility] OR;  Service: ENT;  Laterality: Right;  . Sinus endo w/fusion Left 02/01/2014    Procedure: LEFT ENDOSCOPIC SINUS SURGERY/NASAL POLYPECTOMY WITH FUSION;  Surgeon:  Osborn Coho, MD;  Location: [facility] OR;  Service: ENT;  Laterality: Left;  . Sinus polyps removed     Family History  Problem Relation Age of Onset  . Diabetes Mother   . Cancer Mother   . Hypertension Mother    Social History  Substance Use Topics  . Smoking status: Never Smoker   . Smokeless tobacco: Never Used  . Alcohol Use: Yes     Comment: occas.   OB History    No data available     Review of Systems  Respiratory: Positive for cough and wheezing.   All other systems reviewed and are negative.     Allergies  Naproxen  Home Medications   Prior to Admission medications   Medication Sig Start Date End Date Taking? Authorizing Provider  albuterol (PROVENTIL HFA;VENTOLIN HFA) 108 (90 BASE) MCG/ACT inhaler Inhale 2 puffs into the lungs every 6 (six) hours as needed for wheezing or shortness of breath.    Yes Historical Provider, MD  clonazePAM (KLONOPIN) 0.5 MG tablet Take 1/2-1 tab po BID prn Patient taking differently: Take 0.25-0.5 mg by mouth 2 (two) times daily as needed for anxiety.  10/04/14  Yes Lanier Clam V, PA-C  Fluticasone Furoate-Vilanterol (BREO ELLIPTA) 100-25 MCG/INH AEPB Inhale 1 puff into the lungs daily.   Yes Historical Provider, MD  dicyclomine (BENTYL) 20 MG tablet Take 1 tablet (20 mg total) by mouth 2 (two) times daily. Patient not  taking: Reported on 08/24/2015 07/31/15   Lester KinsmanSamantha Tripp Dowless, PA-C  naproxen (NAPROSYN) 500 MG tablet Take 1 tablet (500 mg total) by mouth 2 (two) times daily with a meal. Patient not taking: Reported on 08/24/2015 07/15/15   Lorelle FormosaHanna Patel-Mills, PA-C  ondansetron (ZOFRAN ODT) 4 MG disintegrating tablet Take 1 tablet (4 mg total) by mouth every 8 (eight) hours as needed for nausea or vomiting. Patient not taking: Reported on 08/24/2015 07/31/15   Samantha Tripp Dowless, PA-C  sertraline (ZOLOFT) 50 MG tablet Take 1 tablet (50 mg total) by mouth daily. Patient not taking: Reported on 08/24/2015 10/04/14   Dorna LeitzNicole Bush V, PA-C   BP  127/86 mmHg  Pulse 103  Temp(Src) 98.2 F (36.8 C) (Oral)  Resp 22  SpO2 98%  LMP 08/10/2015 (Approximate)   Physical Exam  Constitutional: She is oriented to person, place, and time. She appears well-developed and well-nourished. No distress.  HENT:  Head: Normocephalic and atraumatic.  Mouth/Throat: Oropharynx is clear and moist.  Eyes: Conjunctivae and EOM are normal. Pupils are equal, round, and reactive to light.  Neck: Normal range of motion. Neck supple.  Cardiovascular: Normal rate, regular rhythm and normal heart sounds.   Pulmonary/Chest: Effort normal. No respiratory distress. She has no decreased breath sounds. She has wheezes. She has no rhonchi. She has no rales.  Scattered expiratory wheezing; no distress, speaking in full sentences without difficulty  Abdominal: Soft. Bowel sounds are normal. There is no tenderness. There is no guarding.  Musculoskeletal: Normal range of motion.  Neurological: She is alert and oriented to person, place, and time.  Skin: Skin is warm and dry. She is not diaphoretic.  Psychiatric: She has a normal mood and affect.  Nursing note and vitals reviewed.   ED Course  Procedures (including critical care time) Labs Review Labs Reviewed - No data to display  Imaging Review No results found. I have personally reviewed and evaluated these images and lab results as part of my medical decision-making.   EKG Interpretation None      MDM   Final diagnoses:  Asthma, mild intermittent, uncomplicated   30 year old female here with asthma exacerbation. She has been out of her medications for the past 2 weeks, unable to get her doctor's office to refill them. Patient is afebrile, nontoxic. Chest scattered expiratory wheezing on exam. No distress, speaking in full sentences without difficulty. Her oxygen saturation is normal on room air. She was given initial nebulizer treatment in triage was some improvement, given additional albuterol/Atrovent  neb and dose of prednisone in room with resolution of remaining symptoms. She continues to feel well. Her vital signs are stable.   Patient was given albuterol inhaler here in the ED, will refill her Breo prescription for home, Rx prednisone taper.  Follow-up with PCP.  Discussed plan with patient, he/she acknowledged understanding and agreed with plan of care.  Return precautions given for new or worsening symptoms.   Garlon HatchetLisa M Mima Cranmore, PA-C 08/24/15 2153  Lorre NickAnthony Allen, MD 08/24/15 [phone]

## 2015-08-24 NOTE — Discharge Instructions (Signed)
Take the prescribed medication as directed.   Start the prednisone tomorrow, you have already had today's dose. Follow-up with your primary care physician. Return to the ED for new or worsening symptoms.

## 2015-08-24 NOTE — ED Notes (Signed)
Pt states she has been out of her asthma meds and has not been able to get them refilled this week because her doctor's office would not call back. Audible wheezing. Alert and oriented.

## 2015-12-05 ENCOUNTER — Encounter (HOSPITAL_COMMUNITY): Payer: Self-pay | Admitting: Oncology

## 2015-12-05 ENCOUNTER — Encounter (HOSPITAL_COMMUNITY): Payer: Self-pay | Admitting: *Deleted

## 2015-12-05 ENCOUNTER — Emergency Department (HOSPITAL_COMMUNITY)
Admission: EM | Admit: 2015-12-05 | Discharge: 2015-12-05 | Disposition: A | Discharge status: Other Acute Inpatient Hospital | Payer: Managed Care, Other (non HMO) | Attending: Emergency Medicine | Admitting: Emergency Medicine

## 2015-12-05 ENCOUNTER — Observation Stay (HOSPITAL_COMMUNITY)
Admission: AD | Admit: 2015-12-05 | Discharge: 2015-12-06 | Disposition: A | Discharge status: Home / Self Care | Payer: Self-pay | Source: Intra-hospital | Attending: Psychiatry | Admitting: Psychiatry

## 2015-12-05 DIAGNOSIS — F329 Major depressive disorder, single episode, unspecified: Secondary | ICD-10-CM | POA: Insufficient documentation

## 2015-12-05 DIAGNOSIS — Z886 Allergy status to analgesic agent status: Secondary | ICD-10-CM | POA: Insufficient documentation

## 2015-12-05 DIAGNOSIS — F331 Major depressive disorder, recurrent, moderate: Secondary | ICD-10-CM | POA: Diagnosis present

## 2015-12-05 DIAGNOSIS — F411 Generalized anxiety disorder: Secondary | ICD-10-CM | POA: Insufficient documentation

## 2015-12-05 DIAGNOSIS — F410 Panic disorder [episodic paroxysmal anxiety] without agoraphobia: Principal | ICD-10-CM | POA: Insufficient documentation

## 2015-12-05 DIAGNOSIS — R45851 Suicidal ideations: Secondary | ICD-10-CM | POA: Insufficient documentation

## 2015-12-05 DIAGNOSIS — Z79899 Other long term (current) drug therapy: Secondary | ICD-10-CM | POA: Insufficient documentation

## 2015-12-05 DIAGNOSIS — J45909 Unspecified asthma, uncomplicated: Secondary | ICD-10-CM | POA: Insufficient documentation

## 2015-12-05 DIAGNOSIS — Z7951 Long term (current) use of inhaled steroids: Secondary | ICD-10-CM | POA: Insufficient documentation

## 2015-12-05 DIAGNOSIS — R55 Syncope and collapse: Secondary | ICD-10-CM | POA: Insufficient documentation

## 2015-12-05 DIAGNOSIS — F332 Major depressive disorder, recurrent severe without psychotic features: Secondary | ICD-10-CM | POA: Diagnosis present

## 2015-12-05 HISTORY — DX: Major depressive disorder, single episode, unspecified: F32.9

## 2015-12-05 HISTORY — DX: Depression, unspecified: F32.A

## 2015-12-05 LAB — CBC
HCT: 38.7 % (ref 36.0–46.0)
HEMOGLOBIN: 12.8 g/dL (ref 12.0–15.0)
MCH: 27.1 pg (ref 26.0–34.0)
MCHC: 33.1 g/dL (ref 30.0–36.0)
MCV: 81.8 fL (ref 78.0–100.0)
Platelets: 362 10*3/uL (ref 150–400)
RBC: 4.73 MIL/uL (ref 3.87–5.11)
RDW: 14.8 % (ref 11.5–15.5)
WBC: 7.4 10*3/uL (ref 4.0–10.5)

## 2015-12-05 LAB — COMPREHENSIVE METABOLIC PANEL
ALK PHOS: 66 U/L (ref 38–126)
ALT: 12 U/L — AB (ref 14–54)
ANION GAP: 7 (ref 5–15)
AST: 16 U/L (ref 15–41)
Albumin: 4.1 g/dL (ref 3.5–5.0)
BILIRUBIN TOTAL: 1 mg/dL (ref 0.3–1.2)
BUN: 7 mg/dL (ref 6–20)
CALCIUM: 9.1 mg/dL (ref 8.9–10.3)
CO2: 22 mmol/L (ref 22–32)
CREATININE: 0.68 mg/dL (ref 0.44–1.00)
Chloride: 107 mmol/L (ref 101–111)
Glucose, Bld: 107 mg/dL — ABNORMAL HIGH (ref 65–99)
Potassium: 3.8 mmol/L (ref 3.5–5.1)
SODIUM: 136 mmol/L (ref 135–145)
TOTAL PROTEIN: 8 g/dL (ref 6.5–8.1)

## 2015-12-05 LAB — RAPID URINE DRUG SCREEN, HOSP PERFORMED
AMPHETAMINES: NOT DETECTED
Barbiturates: NOT DETECTED
Benzodiazepines: NOT DETECTED
Cocaine: NOT DETECTED
Opiates: NOT DETECTED
TETRAHYDROCANNABINOL: NOT DETECTED

## 2015-12-05 LAB — SALICYLATE LEVEL: Salicylate Lvl: <4 mg/dL (ref 2.8–30.0)

## 2015-12-05 LAB — I-STAT TROPONIN, ED: TROPONIN I, POC: 0 ng/mL (ref 0.00–0.08)

## 2015-12-05 LAB — ACETAMINOPHEN LEVEL: Acetaminophen (Tylenol), Serum: <10 ug/mL — ABNORMAL LOW (ref 10–30)

## 2015-12-05 LAB — ETHANOL

## 2015-12-05 LAB — CBG MONITORING, ED: GLUCOSE-CAPILLARY: 104 mg/dL — AB (ref 65–99)

## 2015-12-05 LAB — PREGNANCY, URINE: PREG TEST UR: NEGATIVE

## 2015-12-05 MED ORDER — MAGNESIUM HYDROXIDE 400 MG/5ML PO SUSP: 30.0000 mL | Freq: Every day | ORAL | Status: DC | PRN

## 2015-12-05 MED ORDER — HYDROXYZINE HCL 25 MG PO TABS
25.0000 mg | ORAL_TABLET | Freq: Four times a day (QID) | ORAL | Status: DC | PRN
  Administered 2015-12-06: 25 mg via ORAL
  Filled 2015-12-05: qty 1
  Filled 2015-12-05: qty 10

## 2015-12-05 MED ORDER — ACETAMINOPHEN 325 MG PO TABS: 650.0000 mg | ORAL_TABLET | Freq: Four times a day (QID) | ORAL | Status: DC | PRN

## 2015-12-05 MED ORDER — ALUM & MAG HYDROXIDE-SIMETH 200-200-20 MG/5ML PO SUSP: 30.0000 mL | ORAL | Status: DC | PRN

## 2015-12-05 MED ORDER — MIRTAZAPINE 7.5 MG PO TABS
7.5000 mg | ORAL_TABLET | Freq: Every day | ORAL | Status: DC
  Administered 2015-12-05: 7.5 mg via ORAL
  Filled 2015-12-05: qty 1
  Filled 2015-12-05 (×2): qty 7

## 2015-12-05 NOTE — Progress Notes (Signed)
Patient is depressed and saddened. She stated that she is feeling anxiety and isolation from a previous relationship. She stated that her significant other just stopped talking to her out of the blue, and does not want to be around her anymore and she does not know why. She stated that living with mother and younger sister does not help with her situation as they are always intrusive of what's going on. She stated that she has passive SI thoughts, with no active plans. She denies SI, HI, and AVH.   Patient remains safe on unit with constant monitoring, education, encouragement, and support. Written and verbal consent for safety contract.   Patient is receptive and compliant, will continue to monitor.

## 2015-12-05 NOTE — ED Notes (Signed)
Per pt she was having sx of a panic attack, racing thoughts, shob, inability to take a deep breath and CP.  Per pt's mom, pt came to her room and had a witnessed syncopal episode.  Pt c/o multiple life stressors.  Endorses SI w/o a plan.  Pt is supposed to be taking an antidepressant however has not taken it in at least 6 months.  Pt is A & O x 4 at this time.  Calm and cooperative.

## 2015-12-05 NOTE — ED Provider Notes (Signed)
CSN: [id number]     Arrival date & time 12/05/15  0603 History   First MD Initiated Contact with Patient 12/05/15 0636     Chief Complaint  Patient presents with  . Loss of Consciousness  . Suicidal    (Consider location/radiation/quality/duration/timing/severity/associated sxs/prior Treatment) Patient is a 30 y.o. female presenting with syncope. The history is provided by the patient and medical records. No language interpreter was used.  Loss of Consciousness Associated symptoms: chest pain and shortness of breath   Associated symptoms: no fever, no nausea, no palpitations and no vomiting      Julie Cardenas is a 30 y.o. female  with a PMH of anxiety/depression and asthma who presents to the Emergency Department with mother after witnessed syncopal episode this morning just PTA. Patient states over the last two weeks she has had difficulty sleeping and poor appetite. These symptoms began shortly after a close friend in her life started distancing himself from her. Patient states last night she could not sleep and began feeling very anxious. She then felt trouble breathing and experienced pain in her chest. She went into her mother's room and mother at bedside states she was "hyperventilating and then just passed out". No shaking or urinary incontinence. No post event confusion or mental status change. Patient states symptoms prior were c/w her typical panic attacks but she did not have any of her home rx klonopin 2/2 financial reasons which typically improves sxs. She has never had a syncopal event in the past. Patient endorses SI over the last two weeks as well with thoughts of cutting her wrists. Denies HI and auditory/visual hallucinations. Patient endorses depressed mood and thoughts that she is not good enough and that something is wrong with her to keep making people in her life leave. Stress at work as well has made the last two weeks very tough. She states mother has been good emotional  support and that mother took her to see a therapist and PCP. She was doing well and felt like things were improving with zoloft, however due to insurance changes and monetary reasons, unable to continue meds and appointments. At time of evaluation, patient admits to nausea. Denies vomiting, chest pain, abdominal pain, sob, back pain or any other associated symptoms.    Past Medical History  Diagnosis Date  . Seasonal allergies   . Asthma   . Bronchitis   . Anxiety     no medicine, prayers aid her   . Headache(784.0)     sinus related  . Leg cramps     PCP- advised her to exercise   . Family history of anesthesia complication     mother slow waking up  . Shortness of breath   . Depression    Past Surgical History  Procedure Laterality Date  . Sinus endo w/fusion Right 09/13/2013    Procedure: RIGHT ENDOSCOPIC SINUS SURGERY WITH FUSION SCAN;  Surgeon: Osborn Coho, MD;  Location: [facility] OR;  Service: ENT;  Laterality: Right;  . Sinus endo w/fusion Left 02/01/2014    Procedure: LEFT ENDOSCOPIC SINUS SURGERY/NASAL POLYPECTOMY WITH FUSION;  Surgeon: Osborn Coho, MD;  Location: [facility] OR;  Service: ENT;  Laterality: Left;  . Sinus polyps removed     Family History  Problem Relation Age of Onset  . Diabetes Mother   . Cancer Mother   . Hypertension Mother    Social History  Substance Use Topics  . Smoking status: Never Smoker   . Smokeless tobacco: Never Used  .  Alcohol Use: Yes     Comment: occas.   OB History    No data available     Review of Systems  Constitutional: Negative for fever and chills.  HENT: Negative for congestion.   Eyes: Negative for visual disturbance.  Respiratory: Positive for shortness of breath. Negative for cough.   Cardiovascular: Positive for chest pain and syncope. Negative for palpitations and leg swelling.  Gastrointestinal: Negative for nausea, vomiting and abdominal pain.  Musculoskeletal: Negative for back pain.  Skin: Negative for rash.   Neurological: Positive for syncope.  Psychiatric/Behavioral: Positive for suicidal ideas and sleep disturbance.      Allergies  Naproxen  Home Medications   Prior to Admission medications   Medication Sig Start Date End Date Taking? Authorizing Provider  albuterol (PROVENTIL HFA;VENTOLIN HFA) 108 (90 BASE) MCG/ACT inhaler Inhale 2 puffs into the lungs every 6 (six) hours as needed for wheezing or shortness of breath.    Yes Historical Provider, MD  fluticasone furoate-vilanterol (BREO ELLIPTA) 100-25 MCG/INH AEPB Inhale 1 puff into the lungs daily. 08/24/15  Yes Garlon HatchetLisa M Sanders, PA-C  clonazePAM (KLONOPIN) 0.5 MG tablet Take 1/2-1 tab po BID prn Patient not taking: Reported on 12/05/2015 10/04/14   Dorna LeitzNicole V Bush, PA-C  dicyclomine (BENTYL) 20 MG tablet Take 1 tablet (20 mg total) by mouth 2 (two) times daily. Patient not taking: Reported on 08/24/2015 07/31/15   Lester KinsmanSamantha Tripp Dowless, PA-C  naproxen (NAPROSYN) 500 MG tablet Take 1 tablet (500 mg total) by mouth 2 (two) times daily with a meal. Patient not taking: Reported on 08/24/2015 07/15/15   Lorelle FormosaHanna Patel-Mills, PA-C  ondansetron (ZOFRAN ODT) 4 MG disintegrating tablet Take 1 tablet (4 mg total) by mouth every 8 (eight) hours as needed for nausea or vomiting. Patient not taking: Reported on 08/24/2015 07/31/15   Lester KinsmanSamantha Tripp Dowless, PA-C  predniSONE (DELTASONE) 20 MG tablet Take 40 mg by mouth daily for 3 days, then 20mg  by mouth daily for 3 days, then 10mg  daily for 3 days Patient not taking: Reported on 12/05/2015 08/24/15   Garlon HatchetLisa M Sanders, PA-C  sertraline (ZOLOFT) 50 MG tablet Take 1 tablet (50 mg total) by mouth daily. Patient not taking: Reported on 08/24/2015 10/04/14   Dorna LeitzNicole V Bush, PA-C   BP 121/71 mmHg  Pulse 68  Temp(Src) 98.3 F (36.8 C) (Oral)  Resp 18  Ht 5' 5.5" (1.664 m)  Wt 90.719 kg  BMI 32.76 kg/m2  SpO2 100%  LMP 11/30/2015 (Approximate) Physical Exam  Constitutional: She is oriented to person, place, and time. She  appears well-developed and well-nourished.  Anxious, tearful. NAD.   HENT:  Mouth/Throat: Oropharynx is clear and moist.  No evidence of tongue biting  Neck: Normal range of motion. Neck supple.  Cardiovascular: Normal rate, regular rhythm, normal heart sounds and intact distal pulses.  Exam reveals no gallop and no friction rub.   No murmur heard. Pulmonary/Chest: Effort normal and breath sounds normal. No respiratory distress. She has no wheezes. She has no rales. She exhibits no tenderness.  Abdominal: Soft. She exhibits no distension. There is no tenderness.  Musculoskeletal: She exhibits no edema.  Neurological: She is alert and oriented to person, place, and time.  Alert, oriented, thought content appropriate, able to give a coherent history. Speech is clear and goal oriented, able to follow commands.  Cranial Nerves II-XII grossly intact.  5/5 muscle strength in upper and lower extremities bilaterally including strong and equal grip strength and dorsiflexion/plantar flexion Sensory to  light touch normal in all four extremities.  Normal finger-to-nose and rapid alternating movements. Normal gait and balance.  Skin: Skin is warm and dry. No pallor.  Cap refill < 3 seconds  Nursing note and vitals reviewed.   ED Course  Procedures (including critical care time) Labs Review Labs Reviewed  COMPREHENSIVE METABOLIC PANEL - Abnormal; Notable for the following:    Glucose, Bld 107 (*)    ALT 12 (*)    All other components within normal limits  ACETAMINOPHEN LEVEL - Abnormal; Notable for the following:    Acetaminophen (Tylenol), Serum <10 (*)    All other components within normal limits  CBG MONITORING, ED - Abnormal; Notable for the following:    Glucose-Capillary 104 (*)    All other components within normal limits  ETHANOL  SALICYLATE LEVEL  CBC  URINE RAPID DRUG SCREEN, HOSP PERFORMED  PREGNANCY, URINE  I-STAT TROPOININ, ED    Imaging Review No results found. I have  personally reviewed and evaluated these images and lab results as part of my medical decision-making.   EKG Interpretation   Date/Time:  Friday December 05 2015 06:28:11 EDT Ventricular Rate:  93 PR Interval:    QRS Duration: 88 QT Interval:  355 QTC Calculation: 442 R Axis:   59 Text Interpretation:  Sinus rhythm Normal ECG Confirmed by POLLINA  MD,  CHRISTOPHER [phone]) on 12/05/2015 6:39:36 AM Also confirmed by Blinda LeatherwoodPOLLINA  MD,  CHRISTOPHER [phone]), editor Dan HumphreysWALKER, CCT, SANDRA ([id number])  on 12/05/2015  6:41:39 AM      MDM   Final diagnoses:  None   Julie Cardenas presents to ED for syncopal episode that occurred while patient was "hyperventilating during a panic attack". Event was witnessed by mother who is at the bedside. Does not sound like seizure-like activity or cardiac etiology, however will check EKG and trop. EKG with NSR, Trop negative. Upreg negative. CBC, ETOH, salicylate, APAP wdl. BMP reassuring. UDS negative.   Patient encouraged to follow up with PCP in regard's to syncopal episode today and discussed the importance of medication compliance to overall health. Dispo pending TTS.     [facility] Support CenterJaime Pilcher Ward, PA-C 12/05/15 1136  Zadie Rhineonald Wickline, MD 12/06/15 [phone]

## 2015-12-05 NOTE — H&P (Signed)
[facility] OBS UNIT H&P  Patient Identification: Julie Cardenas MRN:  [id number] Principal Diagnosis: MDD (major depressive disorder), recurrent severe, without psychosis ([place]) Diagnosis:   Patient Active Problem List   Diagnosis Date Noted  . MDD (major depressive disorder), recurrent severe, without psychosis ([place]) [F33.2] 12/05/2015    Priority: High  . Major depressive disorder, recurrent episode, moderate degree ([place]) [F33.1] 12/05/2015    Priority: High  . Generalized anxiety disorder [F41.1] 10/04/2014    Priority: High  . Major depressive disorder, recurrent episode, moderate ([place]) [F33.1] 12/05/2015  . Depression [F32.9] 10/04/2014  . Nasal polyps [J33.9] 09/13/2013  . Sinusitis, chronic [J32.9] 09/13/2013    Total Time spent with patient: 35 minutes  Subjective:   Julie Cardenas is a 30 y.o. female patient admitted with reports of suicidal ideation and feelings that others are up against her (although not paranoid). Pt seen and chart reviewed. Pt is alert/oriented x4, calm, cooperative, and appropriate to situation. Pt denies suicidal/homicidal ideation and psychosis and does not appear to be responding to internal stimuli. Pt reports that she had suicidal ideation earlier but without plan. Pt is able to contract for safety but was tearful during the assessment, stating that she felt overwhelmed.   HPI: I have reviewed and concur with HPI elements below, modified as follows:  Julie Cardenas is a 30 y.o. female who presented voluntarily to [facility] due to a severe panic attack, where patient passed out. Pt is very tearful during assessment. Pt had been prescribed Zoloft and had been seeing a therapist, whom she indicates was helping, but had to d/c both @ 6 months ago due to losing insurance and not being able to afford it. Pt endorsed SI with no plan for @ 2-3 weeks. Pt identified the main trigger to her recent SI is the possible loss of a longtime friend of hers. Pt stated that she feels that  this friend has been pulling away from her for the past 2 weeks and it's been difficult for her to handle it. Pt expresses feelings of inadequacy and self-depravation in response to this possible loss of friendship. Pt has been living with her mother since the end of last year and cites her as a support, but also as a trigger for her depression and anxiety.   Pt arrived in the [place] without incident and has been somewhat tearful but in no acute distress. See above.  Past Psychiatric History: MDD, GAD  Risk to Self:   Risk to Others:   Prior Inpatient Therapy:   Prior Outpatient Therapy:    Past Medical History:  Past Medical History  Diagnosis Date  . Seasonal allergies   . Asthma   . Bronchitis   . Anxiety     no medicine, prayers aid her   . Headache(784.0)     sinus related  . Leg cramps     PCP- advised her to exercise   . Family history of anesthesia complication     mother slow waking up  . Shortness of breath   . Depression     Past Surgical History  Procedure Laterality Date  . Sinus endo w/fusion Right 09/13/2013    Procedure: RIGHT ENDOSCOPIC SINUS SURGERY WITH FUSION SCAN;  Surgeon: [name], MD;  [facility];  Service: ENT;  Laterality: Right;  . Sinus endo w/fusion Left 02/01/2014    Procedure: LEFT ENDOSCOPIC SINUS SURGERY/NASAL POLYPECTOMY WITH FUSION;  Surgeon: [name], MD;  Location: [place];  Service: ENT;  Laterality: Left;  . Sinus polyps removed     Family History:  Family History  Problem Relation Age of Onset  . Diabetes Mother   . Cancer Mother   . Hypertension Mother    Family Psychiatric  History: MDD Social History:  History  Alcohol Use  . Yes    Comment: occas.     History  Drug Use No    Social History   Social History  . Marital Status: Single    Spouse Name: N/A  . Number of Children: N/A  . Years of Education: N/A   Social History Main Topics  . Smoking status: Never Smoker   . Smokeless tobacco:  Never Used  . Alcohol Use: Yes     Comment: occas.  . Drug Use: No  . Sexual Activity: Yes    Birth Control/ Protection: None   Other Topics Concern  . Not on file   Social History Narrative   Additional Social History:    Allergies:   Allergies  Allergen Reactions  . Naproxen Diarrhea, Nausea And Vomiting and Other (See Comments)    Severe abdominal pain     Labs:  Results for orders placed or performed during the hospital encounter of 12/05/15 (from the past 48 hour(s))  CBG monitoring, ED     Status: Abnormal   Collection Time: 12/05/15  6:59 AM  Result Value Ref Range   Glucose-Capillary 104 (H) 65 - 99 mg/dL  Comprehensive metabolic panel     Status: Abnormal   Collection Time: 12/05/15  7:12 AM  Result Value Ref Range   Sodium 136 135 - 145 mmol/L   Potassium 3.8 3.5 - 5.1 mmol/L   Chloride 107 101 - 111 mmol/L   CO2 22 22 - 32 mmol/L   Glucose, Bld 107 (H) 65 - 99 mg/dL   BUN 7 6 - 20 mg/dL   Creatinine, Ser 0.68 0.44 - 1.00 mg/dL   Calcium 9.1 8.9 - 10.3 mg/dL   Total Protein 8.0 6.5 - 8.1 g/dL   Albumin 4.1 3.5 - 5.0 g/dL   AST 16 15 - 41 U/L   ALT 12 (L) 14 - 54 U/L   Alkaline Phosphatase 66 38 - 126 U/L   Total Bilirubin 1.0 0.3 - 1.2 mg/dL   GFR calc non Af Amer >60 >60 mL/min   GFR calc Af Amer >60 >60 mL/min    Comment: (NOTE) The eGFR has been calculated using the CKD EPI equation. This calculation has not been validated in all clinical situations. eGFR's persistently <60 mL/min signify possible Chronic Kidney Disease.    Anion gap 7 5 - 15  Ethanol     Status: None   Collection Time: 12/05/15  7:12 AM  Result Value Ref Range   Alcohol, Ethyl (B) <5 <5 mg/dL    Comment:        LOWEST DETECTABLE LIMIT FOR SERUM ALCOHOL IS 5 mg/dL FOR MEDICAL PURPOSES ONLY   Salicylate level     Status: None   Collection Time: 12/05/15  7:12 AM  Result Value Ref Range   Salicylate Lvl <5.3 2.8 - 30.0 mg/dL  Acetaminophen level     Status: Abnormal    Collection Time: 12/05/15  7:12 AM  Result Value Ref Range   Acetaminophen (Tylenol), Serum <10 (L) 10 - 30 ug/mL    Comment:        THERAPEUTIC CONCENTRATIONS VARY SIGNIFICANTLY. A RANGE OF 10-30 ug/mL MAY BE AN EFFECTIVE  CONCENTRATION FOR MANY PATIENTS. HOWEVER, SOME ARE BEST TREATED AT CONCENTRATIONS OUTSIDE THIS RANGE. ACETAMINOPHEN CONCENTRATIONS >150 ug/mL AT 4 HOURS AFTER INGESTION AND >50 ug/mL AT 12 HOURS AFTER INGESTION ARE OFTEN ASSOCIATED WITH TOXIC REACTIONS.   cbc     Status: None   Collection Time: 12/05/15  7:12 AM  Result Value Ref Range   WBC 7.4 4.0 - 10.5 K/uL   RBC 4.73 3.87 - 5.11 MIL/uL   Hemoglobin 12.8 12.0 - 15.0 g/dL   HCT 38.7 36.0 - 46.0 %   MCV 81.8 78.0 - 100.0 fL   MCH 27.1 26.0 - 34.0 pg   MCHC 33.1 30.0 - 36.0 g/dL   RDW 14.8 11.5 - 15.5 %   Platelets 362 150 - 400 K/uL  I-stat troponin, ED     Status: None   Collection Time: 12/05/15  7:20 AM  Result Value Ref Range   Troponin i, poc 0.00 0.00 - 0.08 ng/mL   Comment 3            Comment: Due to the release kinetics of cTnI, a negative result within the first hours of the onset of symptoms does not rule out myocardial infarction with certainty. If myocardial infarction is still suspected, repeat the test at appropriate intervals.   Rapid urine drug screen (hospital performed)     Status: None   Collection Time: 12/05/15  7:43 AM  Result Value Ref Range   Opiates NONE DETECTED NONE DETECTED   Cocaine NONE DETECTED NONE DETECTED   Benzodiazepines NONE DETECTED NONE DETECTED   Amphetamines NONE DETECTED NONE DETECTED   Tetrahydrocannabinol NONE DETECTED NONE DETECTED   Barbiturates NONE DETECTED NONE DETECTED    Comment:        DRUG SCREEN FOR MEDICAL PURPOSES ONLY.  IF CONFIRMATION IS NEEDED FOR ANY PURPOSE, NOTIFY LAB WITHIN 5 DAYS.        LOWEST DETECTABLE LIMITS FOR URINE DRUG SCREEN Drug Class       Cutoff (ng/mL) Amphetamine      1000 Barbiturate       200 Benzodiazepine   536 Tricyclics       468 Opiates          300 Cocaine          300 THC              50   Pregnancy, urine     Status: None   Collection Time: 12/05/15  7:43 AM  Result Value Ref Range   Preg Test, Ur NEGATIVE NEGATIVE    Comment:        THE SENSITIVITY OF THIS METHODOLOGY IS >20 mIU/mL.     Current Facility-Administered Medications  Medication Dose Route Frequency Provider Last Rate Last Dose  . acetaminophen (TYLENOL) tablet 650 mg  650 mg Oral Q6H PRN Patrecia Pour, NP      . alum & mag hydroxide-simeth (MAALOX/MYLANTA) 200-200-20 MG/5ML suspension 30 mL  30 mL Oral Q4H PRN Patrecia Pour, NP      . magnesium hydroxide (MILK OF MAGNESIA) suspension 30 mL  30 mL Oral Daily PRN Patrecia Pour, NP        Musculoskeletal: Strength & Muscle Tone: within normal limits Gait & Station: normal Patient leans: N/A  Psychiatric Specialty Exam: Physical Exam  ROS  Blood pressure 109/63, pulse 79, temperature 97.3 F (36.3 C), temperature source Oral, resp. rate 16, last menstrual period 11/30/2015, SpO2 100 %.There is no weight on file to calculate  BMI.  General Appearance: Casual and Fairly Groomed  Eye Contact:  Good  Speech:  Clear and Coherent and Normal Rate  Volume:  Normal  Mood:  Anxious and Depressed  Affect:  Appropriate and Congruent  Thought Process:  Coherent and Goal Directed  Orientation:  Full (Time, Place, and Person)  Thought Content:  Feelings of victimization  Suicidal Thoughts:  Yes.  without intent/plan  Homicidal Thoughts:  No  Memory:  Immediate;   Fair Recent;   Fair Remote;   Fair  Judgement:  Fair  Insight:  Fair  Psychomotor Activity:  Normal  Concentration:  Concentration: Fair and Attention Span: Fair  Recall:  [employer] of Knowledge:  Fair  Language:  Fair  Akathisia:  No  Handed:    AIMS (if indicated):     Assets:  Communication Skills Desire for Improvement Physical Health Resilience  ADL's:  Intact   Cognition:  WNL  Sleep:      Treatment Plan Summary:  MDD (major depressive disorder), recurrent severe, without psychosis ([place]), unstable, warrants overnight observation in [facility] OBS UNIT  Medications: -Start Remeron 7.65m po qhs for MDD/insomnia -Start Vistaril 213mpo q6h prn anxiety  [place] [facility] [place]/2015 5:06 PM

## 2015-12-05 NOTE — Discharge Planning (Signed)
[facility] [place] Pt can walk in anytime Monday-Friday between 8:00 am-3:00 pm. Pt does not have insurance. Pt will need to bring a photo ID and a list of current medication. Spots at this facility are first come first serve.

## 2015-12-05 NOTE — BH Assessment (Signed)
[facility] Assessment Progress Note  Per Thedore MinsMojeed Akintayo, MD, this pt [facility].  Julie Heinrichina Tate, RN, [facility]  Pt has signed Voluntary Admission and Consent for Treatment, as well as Consent to Release Information, and signed [facility].  Pt's nurse has been notified, and agrees to send original paperwork along with pt via [facility] [place], and to call report to [phone] or [phone].  Julie Canninghomas Oluwatobi Ruppe, MA Triage Specialist [phone]

## 2015-12-05 NOTE — BH Assessment (Addendum)
Assessment Note  Julie Cardenas is a 30 y.o. female who [facility] due to a severe panic attack, where patient passed out. Pt is very tearful during assessment. Pt had been prescribed Zoloft and had been seeing a therapist, whom she indicates was helping, but had to d/c both @ 6 months ago due to losing insurance and not being able to afford it. Pt endorsed SI with no plan for @ 2-3 weeks. Pt identified the main trigger to her recent SI is the possible loss of a longtime friend of hers. Pt stated that she feels that this friend has been pulling away from her for the past 2 weeks and it's been difficult for her to handle it. Pt expresses feelings of inadequacy and self-depravation in response to this possible loss of friendship. Pt has been living with her mother since the end of last year and cites her as a support, but also as a trigger for her depression and anxiety.   Diagnosis: MDD; GAD  Past Medical History:  Past Medical History  Diagnosis Date  . Seasonal allergies   . Asthma   . Bronchitis   . Anxiety     no medicine, prayers aid her   . Headache(784.0)     sinus related  . Leg cramps     PCP- advised her to exercise   . Family history of anesthesia complication     mother slow waking up  . Shortness of breath   . Depression     Past Surgical History  Procedure Laterality Date  . Sinus endo w/fusion Right 09/13/2013    Procedure: RIGHT ENDOSCOPIC SINUS SURGERY WITH FUSION SCAN;  Surgeon: Osborn Cohoavid Shoemaker, MD;  [facility];  Service: ENT;  Laterality: Right;  . Sinus endo w/fusion Left 02/01/2014    Procedure: LEFT ENDOSCOPIC SINUS SURGERY/NASAL POLYPECTOMY WITH FUSION;  Surgeon: Osborn Cohoavid Shoemaker, MD;  [facility];  Service: ENT;  Laterality: Left;  . Sinus polyps removed      Family History:  Family History  Problem Relation Age of Onset  . Diabetes Mother   . Cancer Mother   . Hypertension Mother     Social History:  reports that she has never  smoked. She has never used smokeless tobacco. She reports that she drinks alcohol. She reports that she does not use illicit drugs.  Additional Social History:  Alcohol / Drug Use Pain Medications: none reported Prescriptions: pt reports only taking allergy medication Over the Counter: none reported History of alcohol / drug use?: No history of alcohol / drug abuse  CIWA: CIWA-Ar BP: 121/71 mmHg Pulse Rate: 68 COWS:    Allergies:  Allergies  Allergen Reactions  . Naproxen Diarrhea, Nausea And Vomiting and Other (See Comments)    Severe abdominal pain     Home Medications:  (Not in a hospital admission)  OB/GYN Status:  Patient's last menstrual period was 11/30/2015 (approximate).  General Assessment Data [facility] TTS Assessment: In system Is this a Tele or Face-to-Face Assessment?: Face-to-Face Is this an Initial Assessment or a Re-assessment for this encounter?: Initial Assessment Marital status: Single Is patient pregnant?: No Pregnancy Status: No Living Arrangements: Parent Can pt return to current living arrangement?: Yes Admission Status: Voluntary Is patient capable of signing voluntary admission?: Yes Referral Source: Self/Family/Friend Insurance type: none     Crisis Care Plan Living Arrangements: Parent Name of Psychiatrist: none Name of Therapist: none  Education Status Is patient currently in  school?: No  Risk to self with the past 6 months Suicidal Ideation: Yes-Currently Present Has patient been a risk to self within the past 6 months prior to admission? : No Suicidal Intent: No Has patient had any suicidal intent within the past 6 months prior to admission? : No Is patient at risk for suicide?: No Suicidal Plan?: No Has patient had any suicidal plan within the past 6 months prior to admission? : No Access to Means: No What has been your use of drugs/alcohol within the last 12 months?: none Previous  Attempts/Gestures: No Intentional Self Injurious Behavior: None Family Suicide History: Unknown Recent stressful life event(s): Conflict (Comment) (possible loss of a long time friend) Persecutory voices/beliefs?: No Depression: Yes Depression Symptoms: Tearfulness, Insomnia, Guilt, Feeling worthless/self pity Substance abuse history and/or treatment for substance abuse?: No Suicide prevention information given to non-admitted patients: Not applicable  Risk to Others within the past 6 months Homicidal Ideation: No Does patient have any lifetime risk of violence toward others beyond the six months prior to admission? : No Thoughts of Harm to Others: No Current Homicidal Intent: No Current Homicidal Plan: No Access to Homicidal Means: No History of harm to others?: No Assessment of Violence: None Noted Does patient have access to weapons?: No Criminal Charges Pending?: No Does patient have a court date: No Is patient on probation?: No  Psychosis Hallucinations: None noted Delusions: None noted  Mental Status Report Appearance/Hygiene: Unremarkable Eye Contact: Fair Motor Activity: Unremarkable Speech: Logical/coherent Level of Consciousness: Crying, Alert Mood: Depressed Affect: Appropriate to circumstance Anxiety Level: Panic Attacks Panic attack frequency: daily Most recent panic attack: this morning Thought Processes: Coherent, Relevant Judgement: Partial Orientation: Person, Place, Time, Situation, Appropriate for developmental age Obsessive Compulsive Thoughts/Behaviors: None  Cognitive Functioning Concentration: Normal Memory: Recent Intact, Remote Intact IQ: Average Insight: see judgement above Impulse Control: Good Appetite: Poor Sleep: Decreased Total Hours of Sleep: 4 Vegetative Symptoms: None  ADLScreening [facility] Assessment Services) Patient's cognitive ability adequate to safely complete daily activities?: Yes Patient able to express need for  assistance with ADLs?: Yes Independently performs ADLs?: Yes (appropriate for developmental age)  Prior Inpatient Therapy Prior Inpatient Therapy: No  Prior Outpatient Therapy Prior Outpatient Therapy: Yes Prior Therapy Dates: 2017 Prior Therapy Facilty/Provider(s): [facility] Reason for Treatment: MDD; GAD Does patient have an ACCT team?: No Does patient have Intensive In-House Services?  : No Does patient have [facility] services? : No Does patient have P4[facility] services?: No  ADL Screening (condition at time of admission) Patient's cognitive ability adequate to safely complete daily activities?: Yes Is the patient deaf or have difficulty hearing?: No Does the patient have difficulty seeing, even when wearing glasses/contacts?: No Does the patient have difficulty concentrating, remembering, or making decisions?: No Patient able to express need for assistance with ADLs?: Yes Does the patient have difficulty dressing or bathing?: No Independently performs ADLs?: Yes (appropriate for developmental age) Does the patient have difficulty walking or climbing stairs?: No Weakness of Legs: None Weakness of Arms/Hands: None  Home Assistive Devices/Equipment Home Assistive Devices/Equipment: None  Therapy Consults (therapy consults require a physician order) PT Evaluation Needed: No OT Evalulation Needed: No SLP Evaluation Needed: No Abuse/Neglect Assessment (Assessment to be complete while patient is alone) Physical Abuse: Denies Verbal Abuse: Yes, past (Comment) Sexual Abuse: Denies Exploitation of patient/patient's resources: Denies Self-Neglect: Denies Values / Beliefs Cultural Requests During Hospitalization: None Spiritual Requests During Hospitalization: None Consults Spiritual Care Consult Needed: No Social Work Consult Needed: No  Advance Directives (For Healthcare) Does patient have an advance directive?: No Would patient like information on creating an advanced  directive?: No - patient declined information    Additional Information 1:1 In Past 12 Months?: No CIRT Risk: No Elopement Risk: No Does patient have medical clearance?: Yes     Disposition:  Disposition Initial Assessment Completed for this Encounter: Yes Disposition of Patient: Other dispositions Other disposition(s): Other (Comment) (Pt accepted OBS by Dr. Lucianne MussKumar)  On Site Evaluation by:   Reviewed with Physician:    Laddie AquasSamantha M Marylene Masek 12/05/2015 12:56 PM

## 2015-12-05 NOTE — Progress Notes (Signed)
Initial Interdisciplinary Treatment Plan   PATIENT STRESSORS:  Loss of relationships  Feelings of having "no space."   PATIENT STRENGTHS: Ability for insight Average or above average intelligence Capable of independent living Communication skills General fund of knowledge Motivation for treatment/growth   PROBLEM LIST: Problem List/Patient Goals Date to be addressed Date deferred Reason deferred Estimated date of resolution  I need to get over this relationship. 12/05/2015     I need to learn how to put self first. 12/05/2015     I need to have better management skills 12/05/2015                                          DISCHARGE CRITERIA:  Ability to meet basic life and health needs Improved stabilization in mood, thinking, and/or behavior Motivation to continue treatment in a less acute level of care Need for constant or close observation no longer present Safe-care adequate arrangements made Withdrawal symptoms are absent or subacute and managed without 24-hour nursing intervention  PRELIMINARY DISCHARGE PLAN: Outpatient therapy  PATIENT/FAMIILY INVOLVEMENT: This treatment plan has been presented to and reviewed with the patient, Julie Cardenas.  The patient has been given the opportunity to ask questions and make suggestions.  Laren BoomHarmon, Litisha Guagliardo P 12/05/2015, 5:51 PM

## 2015-12-06 DIAGNOSIS — F332 Major depressive disorder, recurrent severe without psychotic features: Secondary | ICD-10-CM

## 2015-12-06 MED ORDER — HYDROXYZINE HCL 25 MG PO TABS: 25.0000 mg | ORAL_TABLET | Freq: Four times a day (QID) | ORAL | Status: DC | PRN

## 2015-12-06 MED ORDER — MIRTAZAPINE 7.5 MG PO TABS: 7.5000 mg | ORAL_TABLET | Freq: Every day | ORAL | Status: DC

## 2015-12-06 NOTE — Discharge Summary (Signed)
Physician Discharge Summary Note  Patient:  Julie Cardenas is an 30 y.o., female MRN:  [id number] DOB:  [birth year] Patient phone:  [phone] (home)  Patient address:   [address] [place] [address],  Total Time spent with patient: 30 minutes  Date of Admission:  12/05/2015 Date of Discharge: 12/06/2015  Reason for Admission: Per Assessment Note-Julie Cardenas is a 30 y.o. female who presented voluntarily to [facility] due to a severe panic attack, where patient passed out. Pt is very tearful during assessment. Pt had been prescribed Zoloft and had been seeing a therapist, whom she indicates was helping, but had to d/c both @ 6 months ago due to losing insurance and not being able to afford it. Pt endorsed SI with no plan for @ 2-3 weeks. Pt identified the main trigger to her recent SI is the possible loss of a longtime friend of hers. Pt stated that she feels that this friend has been pulling away from her for the past 2 weeks and it's been difficult for her to handle it. Pt expresses feelings of inadequacy and self-depravation in response to this possible loss of friendship. Pt has been living with her mother since the end of last year and cites her as a support, but also as a trigger for her depression and anxiety.   Principal Problem: MDD (major depressive disorder), recurrent severe, without psychosis [facility]) Discharge Diagnoses: Patient Active Problem List   Diagnosis Date Noted  . Major depressive disorder, recurrent episode, moderate (HCC) [F33.1] 12/05/2015  . MDD (major depressive disorder), recurrent severe, without psychosis (HCC) [F33.2] 12/05/2015  . Major depressive disorder, recurrent episode, moderate degree (HCC) [F33.1] 12/05/2015  . Depression [F32.9] 10/04/2014  . Generalized anxiety disorder [F41.1] 10/04/2014  . Nasal polyps [J33.9] 09/13/2013  . Sinusitis, chronic [J32.9] 09/13/2013    Past Psychiatric History: See Above  Past Medical History:  Past  Medical History  Diagnosis Date  . Seasonal allergies   . Asthma   . Bronchitis   . Anxiety     no medicine, prayers aid her   . Headache(784.0)     sinus related  . Leg cramps     PCP- advised her to exercise   . Family history of anesthesia complication     mother slow waking up  . Shortness of breath   . Depression     Past Surgical History  Procedure Laterality Date  . Sinus endo w/fusion Right 09/13/2013    Procedure: RIGHT ENDOSCOPIC SINUS SURGERY WITH FUSION SCAN;  Surgeon: Osborn Coho, MD;  Location[facility] OR;  Service: ENT;  Laterality: Right;  . Sinus endo w/fusion Left 02/01/2014    Procedure: LEFT ENDOSCOPIC SINUS SURGERY/NASAL POLYPECTOMY WITH FUSION;  Surgeon: Osborn Coho, MD;  Location: [facility] OR;  Service: ENT;  Laterality: Left;  . Sinus polyps removed     Family History:  Family History  Problem Relation Age of Onset  . Diabetes Mother   . Cancer Mother   . Hypertension Mother    Family Psychiatric  History: See Above Social History:  History  Alcohol Use  . Yes    Comment: occas.     History  Drug Use No    Social History   Social History  . Marital Status: Single    Spouse Name: N/A  . Number of Children: N/A  . Years of Education: N/A   Social History Main Topics  . Smoking status: Never Smoker   . Smokeless tobacco:  Never Used  . Alcohol Use: Yes     Comment: occas.  . Drug Use: No  . Sexual Activity: Yes    Birth Control/ Protection: None   Other Topics Concern  . None   Social History Narrative    Hospital Course: Julie Cardenas was admitted for MDD (major depressive disorder), recurrent severe, without psychosis (HCC) and crisis management.  Pt was treated discharged with the medications listed below under Medication List.  Medical problems were identified and treated as needed.  Home medications were restarted as appropriate.  Improvement was monitored by observation and Julie Cardenas 's daily report of symptom reduction.   Emotional and mental status was monitored by daily self-inventory reports completed by Julie Cardenas and clinical staff.         Julie Cardenas was evaluated by the treatment team for stability and plans for continued recovery upon discharge. Julie Cardenas 's motivation was an integral factor for scheduling further treatment. Employment, transportation, bed availability, health status, family support, and any pending legal issues were also considered during hospital stay. Pt was offered further treatment options upon discharge including but not limited to Residential, Intensive Outpatient, and Outpatient treatment.  Julie AddisonKristy Y Cardenas will follow up with the services as listed below under Follow Up Information.     Upon completion of this admission the patient was both mentally and medically stable for discharge denying suicidal/homicidal ideation, auditory/visual/tactile hallucinations, delusional thoughts and paranoia. Patient reports taken Zoloft in the pasted however stopped taken to due a change in her insurance. Patient reports a recent panic attack that "scared her." Patient denies suicidal or homicidal ideation.- Patient to follow-up with [facility]. [facility] provided 14 day supply on current medication started while on Observation Unit.  Julie Cardenas responded well to treatment with vistaril and Remeron without adverse effects. Pt demonstrated improvement without reported or observed adverse effects to the point of stability appropriate for outpatient management. Pertinent labs include: CMP and  CBC  for which outpatient follow-up is necessary for lab recheck as mentioned below. Reviewed CBC, CMP, BAL, and UDS; all unremarkable aside from noted exceptions.   Physical Findings: AIMS: Facial and Oral Movements Muscles of Facial Expression: None, normal Lips and Perioral Area: None, normal Jaw: None, normal Tongue: None, normal,Extremity Movements Upper (arms, wrists, hands, fingers): None,  normal Lower (legs, knees, ankles, toes): None, normal, Trunk Movements Neck, shoulders, hips: None, normal, Overall Severity Severity of abnormal movements (highest score from questions above): None, normal Incapacitation due to abnormal movements: None, normal Patient's awareness of abnormal movements (rate only patient's report): No Awareness, Dental Status Current problems with teeth and/or dentures?: No Does patient usually wear dentures?: No  CIWA:  CIWA-Ar Total: 0 COWS:  COWS Total Score: 0  Musculoskeletal: Strength & Muscle Tone: within normal limits Gait & Station: normal Patient leans: N/A  Psychiatric Specialty Exam: Physical Exam  Nursing note and vitals reviewed. Constitutional: She is oriented to person, place, and time. She appears well-developed.  Musculoskeletal: Normal range of motion.  Neurological: She is alert and oriented to person, place, and time.  Psychiatric: She has a normal mood and affect. Her behavior is normal.    Review of Systems  Psychiatric/Behavioral: Negative for suicidal ideas and hallucinations. Depression: stable. Nervous/anxious: stable.   All other systems reviewed and are negative.   Blood pressure 122/81, pulse 75, temperature 98 F (36.7 C), temperature source Oral, resp. rate 17, height 5' 4.76" (1.645 m), weight 95  kg (209 lb 7 oz), last menstrual period 11/30/2015, SpO2 100 %.Body mass index is 35.11 kg/(m^2).  General Appearance: Casual  Eye Contact:  Good  Speech:  Clear and Coherent  Volume:  Normal  Mood:  Anxious  Affect:  Congruent  Thought Process:  Coherent  Orientation:  Full (Time, Place, and Person)  Thought Content:  Hallucinations: None  Suicidal Thoughts:  No  Homicidal Thoughts:  No  Memory:  Immediate;   Good Recent;   Good Remote;   Good  Judgement:  Good  Insight:  Fair  Psychomotor Activity:  Normal  Concentration:  Concentration: Fair  Recall:  Good  Fund of Knowledge:  Good  Language:  Good   Akathisia:  No  Handed:  Right  AIMS (if indicated):     Assets:  Desire for Improvement Housing Social Support  ADL's:  Intact  Cognition:  WNL  Sleep:           Has this patient used any form of tobacco in the last 30 days? (Cigarettes, Smokeless Tobacco, Cigars, and/or Pipes) Yes, No  Blood Alcohol level:  Lab Results  Component Value Date   ETH <5 12/05/2015    Metabolic Disorder Labs:  No results found for: HGBA1C, MPG No results found for: PROLACTIN No results found for: CHOL, TRIG, HDL, CHOLHDL, VLDL, LDLCALC  See Psychiatric Specialty Exam and Suicide Risk Assessment completed by Attending Physician prior to discharge.  Discharge destination:  Home  Is patient on multiple antipsychotic therapies at discharge:  No   Has Patient had three or more failed trials of antipsychotic monotherapy by history:  No  Recommended Plan for Multiple Antipsychotic Therapies: NA  Discharge Instructions    Activity as tolerated - No restrictions    Complete by:  As directed      Diet general    Complete by:  As directed      Discharge instructions    Complete by:  As directed   Take all medications as prescribed. Keep all follow-up appointments as scheduled.  Do not consume alcohol or use illegal drugs while on prescription medications. Report any adverse effects from your medications to your primary care provider promptly.  In the event of recurrent symptoms or worsening symptoms, call 911, a crisis hotline, or go to the nearest emergency department for evaluation.            Medication List    TAKE these medications      Indication   albuterol 108 (90 Base) MCG/ACT inhaler  Commonly known as:  PROVENTIL HFA;VENTOLIN HFA  Inhale 2 puffs into the lungs every 6 (six) hours as needed for wheezing or shortness of breath.      fluticasone furoate-vilanterol 100-25 MCG/INH Aepb  Commonly known as:  BREO ELLIPTA  Inhale 1 puff into the lungs daily.      hydrOXYzine 25  MG tablet  Commonly known as:  ATARAX/VISTARIL  Take 1 tablet (25 mg total) by mouth every 6 (six) hours as needed for anxiety.   Indication:  Anxiety Neurosis     mirtazapine 7.5 MG tablet  Commonly known as:  REMERON  Take 1 tablet (7.5 mg total) by mouth at bedtime.   Indication:  Trouble Sleeping, Major Depressive Disorder           Follow-up Information    Follow up with [facility]. Go on 12/08/2015.   Specialty:  Behavioral Health   Why:  Pt has agreed to follow up with [facility] on Monday,  December 08, 2015 to continue with mental health/medication management as needed. Pt will also be provided with a list of OPT resources and crisis numbers if symptoms worsen post discharge.   [address] [place] [address] [phone]        Follow-up recommendations:  Activity:  as tolerated Diet:  heart healthy  Comments:  Take all medications as prescribed. Keep all follow-up appointments as scheduled.  Do not consume alcohol or use illegal drugs while on prescription medications. Report any adverse effects from your medications to your primary care provider promptly.  In the event of recurrent symptoms or worsening symptoms, call 911, a crisis hotline, or go to the nearest emergency department for evaluation.   Signed: Oneta Rack, NP 12/06/2015, 11:02 AM

## 2015-12-06 NOTE — BHH Counselor (Addendum)
Pt will be discharged after 10:00 am today and has agreed to [facility] Monday, December [facility], 2015 to continue with mental health/medication management as needed. Pt will also be provided with a list of OPT resources and crisis numbers if symptoms worsen post discharge. Pt was informed that she can she can walk in anytime Monday-Friday between the 8:00 am-3:00 pm. Pt was informed that she will need to bring a photo ID and a list of current medication.  Ardelle ParkLatoya McNeil, MA OBS Counselor

## 2015-12-06 NOTE — Progress Notes (Signed)
Patient was discharged at 1053. She was given scripts, samples, AVS, and belongings.

## 2015-12-06 NOTE — Progress Notes (Signed)
D Pt. Denies SI and HI, no complaints of pain or discomfort noted at present time.  Pt, received remeron reporting this was the first time she has taken this medication.  A Writer offered support and encouragement.  R Pt. Accepted the medication without incidence.  Pt. Has been resting most of this pm.  Pt. Remains safe on the unit.

## 2015-12-06 NOTE — Progress Notes (Signed)
Patient was feeling depressed this am. She stated that she rated her depression a 7/ anxiety a 7/ and hopelessness a 3/ She denied SI, HI, and AVH.   Patient remained safe on unit with constant monitoring, education, support, and encouragement. Patient was administered PRN hydroxyzine.   Patient is receptive and cooperative, will continue to monitor.

## 2016-09-24 ENCOUNTER — Emergency Department (HOSPITAL_COMMUNITY): Payer: Managed Care, Other (non HMO)

## 2016-09-24 ENCOUNTER — Encounter (HOSPITAL_COMMUNITY): Payer: Self-pay | Admitting: Emergency Medicine

## 2016-09-24 ENCOUNTER — Emergency Department (HOSPITAL_COMMUNITY)
Admission: EM | Admit: 2016-09-24 | Discharge: 2016-09-24 | Disposition: A | Discharge status: Home / Self Care | Payer: Managed Care, Other (non HMO) | Attending: Emergency Medicine | Admitting: Emergency Medicine

## 2016-09-24 DIAGNOSIS — J45909 Unspecified asthma, uncomplicated: Secondary | ICD-10-CM | POA: Insufficient documentation

## 2016-09-24 DIAGNOSIS — Z79899 Other long term (current) drug therapy: Secondary | ICD-10-CM | POA: Insufficient documentation

## 2016-09-24 DIAGNOSIS — J069 Acute upper respiratory infection, unspecified: Secondary | ICD-10-CM | POA: Insufficient documentation

## 2016-09-24 MED ORDER — FLUORESCEIN SODIUM 0.6 MG OP STRP
1.0000 | ORAL_STRIP | Freq: Once | OPHTHALMIC | Status: AC
  Administered 2016-09-24: 1 via OPHTHALMIC
  Filled 2016-09-24: qty 1

## 2016-09-24 MED ORDER — AZITHROMYCIN 250 MG PO TABS: 250.0000 mg | ORAL_TABLET | Freq: Every day | ORAL | 0 refills | Status: DC

## 2016-09-24 MED ORDER — IPRATROPIUM-ALBUTEROL 0.5-2.5 (3) MG/3ML IN SOLN
3.0000 mL | Freq: Once | RESPIRATORY_TRACT | Status: AC
  Administered 2016-09-24: 3 mL via RESPIRATORY_TRACT
  Filled 2016-09-24: qty 3

## 2016-09-24 MED ORDER — NAPHAZOLINE HCL 0.1 % OP SOLN: 1.0000 [drp] | Freq: Three times a day (TID) | OPHTHALMIC | 0 refills | Status: DC

## 2016-09-24 MED ORDER — TETRACAINE HCL 0.5 % OP SOLN
1.0000 [drp] | Freq: Once | OPHTHALMIC | Status: AC
  Administered 2016-09-24: 1 [drp] via OPHTHALMIC
  Filled 2016-09-24: qty 4

## 2016-09-24 MED ORDER — FLUTICASONE FUROATE-VILANTEROL 100-25 MCG/INH IN AEPB: 1.0000 | INHALATION_SPRAY | Freq: Every day | RESPIRATORY_TRACT | 0 refills | Status: DC

## 2016-09-24 MED ORDER — ALBUTEROL SULFATE HFA 108 (90 BASE) MCG/ACT IN AERS: 2.0000 | INHALATION_SPRAY | RESPIRATORY_TRACT | 0 refills | Status: DC | PRN

## 2016-09-24 NOTE — ED Triage Notes (Signed)
Pt with nasal congestion, productive cough with dark yellow sputum. Pt states she lives with family that has been sick and have tested positive for flu. Pt has been using inhalers, no relief.

## 2016-09-24 NOTE — ED Notes (Signed)
Bed: WTR6 Expected date:  Expected time:  Means of arrival:  Comments: 

## 2016-09-24 NOTE — ED Provider Notes (Signed)
[facility] DEPT Provider Note   CSN: [id number] Arrival date & time: 09/24/16  6222  By signing my name below, I, [name], attest that this documentation has been prepared under the direction and in the presence of Volanda Napoleon, PA-C. Electronically Signed: [name], Scribe. 09/24/2016. 10:26 AM.  History   Chief Complaint Chief Complaint  Patient presents with  . Cough  . Nasal Congestion  . Eye Pain    L eye  . Eye Drainage   The history is provided by the patient. No language interpreter was used.   HPI Comments: Julie Cardenas is a 31 y.o. female with PMHx of asthma, bronchitis, and seasonal allergies, who presents to the Emergency Department complaining of gradually worsening, cough productive of dark yellow and brown sputum that began ~2 weeks ago. Pt reports associated congestion, sore throat, generalized body aches, and intermittent, central chest pain and shortness of breath secondary to her cough that she notes has been intermittently ongoing for 2 weeks. She states she has used her ProAir inhaler to relieve her symptoms with no relief and reports she ran out of her Breo inhaler ~1 month ago. Pt states both her sister and mother at home were recently diagnosed with the flu followed by pneumonia. She denies any fever, nausea, vomiting, abdominal pain, bilateral ear pain, hearing loss, or ringing in her ears.   Pt also complains of gradually worsening, bilateral eye "twitching" that is worse in her left eye compared to her right. She states associated intermittent episodes of blurry vision in her left eye and clear drainage from her bilateral eyes with a "burning/itching" sensation in her left eye. She denies any other complaints.    Past Medical History:  Diagnosis Date  . Anxiety    no medicine, prayers aid her   . Asthma   . Bronchitis   . Depression   . Family history of anesthesia complication    mother slow waking up  . Headache(784.0)    sinus related  .  Leg cramps    PCP- advised her to exercise   . Seasonal allergies   . Shortness of breath     Patient Active Problem List   Diagnosis Date Noted  . Major depressive disorder, recurrent episode, moderate ([place]) 12/05/2015  . MDD (major depressive disorder), recurrent severe, without psychosis ([place]) 12/05/2015  . Major depressive disorder, recurrent episode, moderate degree ([place]) 12/05/2015  . Depression 10/04/2014  . Generalized anxiety disorder 10/04/2014  . Nasal polyps 09/13/2013  . Sinusitis, chronic 09/13/2013    Past Surgical History:  Procedure Laterality Date  . SINUS ENDO W/FUSION Right 09/13/2013   Procedure: RIGHT ENDOSCOPIC SINUS SURGERY WITH FUSION SCAN;  Surgeon: [name], MD;  Location: [facility];  Service: ENT;  Laterality: Right;  . SINUS ENDO W/FUSION Left 02/01/2014   Procedure: LEFT ENDOSCOPIC SINUS SURGERY/NASAL POLYPECTOMY WITH FUSION;  Surgeon: [name], MD;  Location: [facility];  Service: ENT;  Laterality: Left;  . sinus polyps removed      OB History    No data available     Home Medications    Prior to Admission medications   Medication Sig Start Date End Date Taking? Authorizing Provider  albuterol (PROVENTIL HFA;VENTOLIN HFA) 108 (90 Base) MCG/ACT inhaler Inhale 2 puffs into the lungs every 4 (four) hours as needed for wheezing or shortness of breath. 09/24/16   Volanda Napoleon, PA-C  azithromycin (ZITHROMAX) 250 MG tablet Take 1 tablet (250 mg total) by mouth daily.  Take first 2 tablets together, then 1 every day until finished. 09/24/16   Volanda Napoleon, PA-C  fluticasone furoate-vilanterol (BREO ELLIPTA) 100-25 MCG/INH AEPB Inhale 1 puff into the lungs daily. 09/24/16   Volanda Napoleon, PA-C  hydrOXYzine (ATARAX/VISTARIL) 25 MG tablet Take 1 tablet (25 mg total) by mouth every 6 (six) hours as needed for anxiety. 12/06/15   Derrill Center, NP  mirtazapine (REMERON) 7.5 MG tablet Take 1 tablet (7.5 mg total) by mouth at bedtime. 12/06/15   Derrill Center, NP  naphazoline (NAPHCON) 0.1 % ophthalmic solution Place 1 drop into both eyes 3 (three) times daily. 09/24/16   Volanda Napoleon, PA-C    Family History Family History  Problem Relation Age of Onset  . Diabetes Mother   . Cancer Mother   . Hypertension Mother     Social History Social History  Substance Use Topics  . Smoking status: Never Smoker  . Smokeless tobacco: Never Used  . Alcohol use Yes     Comment: occas.   Allergies   Naproxen  Review of Systems Review of Systems  Constitutional: Negative for fever.  HENT: Positive for congestion and sore throat. Negative for ear pain and hearing loss.   Eyes: Positive for pain, itching and visual disturbance.  Respiratory: Positive for cough and shortness of breath.   Cardiovascular: Positive for chest pain.  Gastrointestinal: Negative for abdominal pain, constipation, diarrhea, nausea and vomiting.  Genitourinary: Negative for dysuria and hematuria.  Musculoskeletal: Positive for myalgias.  Neurological: Negative for headaches.  All other systems reviewed and are negative.  Physical Exam Updated Vital Signs BP 129/80 (BP Location: Left Arm)   Pulse 98   Temp 98.2 F (36.8 C) (Oral)   Resp 20   LMP 08/01/2016 (Approximate)   SpO2 100%   Physical Exam  Constitutional: She appears well-developed and well-nourished.  HENT:  Head: Normocephalic and atraumatic.  Left Ear: External ear normal.  Mouth/Throat: Oropharynx is clear and moist and mucous membranes are normal. No oropharyngeal exudate.  Eyes: Conjunctivae and EOM are normal. Pupils are equal, round, and reactive to light. Right eye exhibits no discharge. Left eye exhibits no discharge. Right conjunctiva is not injected. Left conjunctiva is not injected. No scleral icterus.  No conjunctival injection, good EOMI without pain, no discharge. No corneal abrasion, no fluorescein uptake.     Visual Acuity  Right Eye Distance: 20/20 Left Eye  Distance: 20/25 Bilateral Distance: 20/15   Pulmonary/Chest: Effort normal. No accessory muscle usage. No respiratory distress. She has no wheezes.  Diffuse crackles throughout but no focal consolidation. Able to speak in full sentences without difficulty. No signs of respiratory distress.   Musculoskeletal: She exhibits no deformity.  Lymphadenopathy:    She has no cervical adenopathy.  Neurological: She is alert.  Skin: Skin is warm and dry.  Psychiatric: She has a normal mood and affect. Her speech is normal and behavior is normal.   ED Treatments / Results  DIAGNOSTIC STUDIES:  Oxygen Saturation is 100% on RA, normal by my interpretation.    COORDINATION OF CARE:  10:17 AM Discussed treatment plan with pt at bedside and pt agreed to plan.  Labs (all labs ordered are listed, but only abnormal results are displayed) Labs Reviewed - No data to display  EKG  EKG Interpretation None       Radiology Dg Chest 2 View  Result Date: 09/24/2016 CLINICAL DATA:  Cough, shortness of breath, history of asthma EXAM: CHEST  2 VIEW COMPARISON:  01/30/2014 FINDINGS: Normal heart size and vascularity. Mild chronic central airway thickening / bronchitic change compatible with history of a asthma. No focal pneumonia, collapse consolidation. Negative for edema, effusion or pneumothorax. Trachea is midline. IMPRESSION: Chronic central airway thickening / bronchitic change compatible with asthma. No focal pneumonia. Electronically Signed   By: Jerilynn Mages.  Shick M.D.   On: 09/24/2016 10:48   Procedures Procedures (including critical care time)  Medications Ordered in ED Medications  ipratropium-albuterol (DUONEB) 0.5-2.5 (3) MG/3ML nebulizer solution 3 mL (3 mLs Nebulization Given 09/24/16 1048)  tetracaine (PONTOCAINE) 0.5 % ophthalmic solution 1 drop (1 drop Left Eye Given by Other 09/24/16 1057)  fluorescein ophthalmic strip 1 strip (1 strip Left Eye Given 09/24/16 1057)    Initial Impression /  Assessment and Plan / ED Course  I have reviewed the triage vital signs and the nursing notes.  Pertinent labs & imaging results that were available during my care of the patient were reviewed by me and considered in my medical decision making (see chart for details).     31 year old female with past medical history of asthma who presents with 2 weeks of worsening productive cough, congestion, sore throat. She takes Adair Patter for her asthma but states she has been out of it for about 1 month. She's been using her albuterol rescue inhaler daily for relief of symptoms. Patient is afebrile, non-toxic appearing, sitting comfortably on examination table. Physical exam with no wheezing on lung exam but some diffuse rales throughout. No posterior oropharynx edema, erythema, exudates. Low suspicion for strep given physical exam and lack of centor criteria met. Consider URI  vs pneumonia vs bronchitis. Low suspicion for influenza given history/physical exam and vitals in the department. Will plan to check chest x-ray to eval for any infectious etiology, EKG. Nebulizer treatment provided in the department for symptomatic relief. Regarding eye symptoms consider allergic rhinitis versus fatigue. Also consider bacterial conjunctivitis, though low suspicion given history/physical exam.  Patient reports improvement after nebulizer treatment. Chest x-ray reviewed and negative for any infectious etiology. Woods lamp of bilateral eyes reveals no corneal abrasion or fluorescein uptake. Visual acuity obtained. Slightly decrease in the left eye but otherwise within normal limits. Symptoms likely result of allergic conjunctivitis. Will provide patient with antihistamine eyedrops. She has an eye doctor that she sees. Instructed her to follow-up with her eye doctor and call to be seen the next 24-48 hours. Will plan to treat as upper respiratory infection. Will plan to refill her albuterol and Breo inhalers for asthma. Provided  patient with a list of clinic resources to use if he does not have a PCP. Instructed to call them today to arrange follow-up in the next 24-48 hours. Return precautions discussed. Patient expresses understanding and agreement to plan.    Final Clinical Impressions(s) / ED Diagnoses   Final diagnoses:  Upper respiratory tract infection, unspecified type    New Prescriptions New Prescriptions   ALBUTEROL (PROVENTIL HFA;VENTOLIN HFA) 108 (90 BASE) MCG/ACT INHALER    Inhale 2 puffs into the lungs every 4 (four) hours as needed for wheezing or shortness of breath.   AZITHROMYCIN (ZITHROMAX) 250 MG TABLET    Take 1 tablet (250 mg total) by mouth daily. Take first 2 tablets together, then 1 every day until finished.   FLUTICASONE FUROATE-VILANTEROL (BREO ELLIPTA) 100-25 MCG/INH AEPB    Inhale 1 puff into the lungs daily.   NAPHAZOLINE (NAPHCON) 0.1 % OPHTHALMIC SOLUTION    Place 1 drop  into both eyes 3 (three) times daily.   I personally performed the services described in this documentation, which was scribed in my presence. The recorded information has been reviewed and is accurate.    Volanda Napoleon, PA-C 09/24/16 [place], MD 09/24/16 [phone]

## 2016-09-24 NOTE — ED Notes (Signed)
Bed: WTR5 Expected date:  Expected time:  Means of arrival:  Comments: 

## 2016-09-24 NOTE — Discharge Instructions (Addendum)
Take antibiotics as directed.  Use the eyedrops as directed. Do not use for more than 2 weeks. Try to avoid rubbing her eyes. May use cool compresses on her eyes that may help with the symptoms.  You may use the [employer] as needed for cough.  Follow-up with one of the clinics listed below to establish primary care. Call them and arrange for an appointment to be seen next 24-48 hours.  Follow-up with your eye doctor. Call and arrange for an appointment next 3 days.  Return the emergency department for any worsening cough, difficult to breathing, chest pain, persistent fever, any other worsening or concerning symptoms.  If you do not have a primary care doctor you see regularly, please you the list below. Please call them to arrange for follow-up.    No Primary Care Doctor Call [facility]  [phone] Other agencies that provide inexpensive medical care    [facility]  [phone]    [facility]  [phone]    [facility]  [phone]    [facility]  [phone]    [facility]  [phone]    [facility]  [phone]

## 2016-12-05 ENCOUNTER — Encounter (HOSPITAL_COMMUNITY): Payer: Self-pay | Admitting: Emergency Medicine

## 2016-12-05 ENCOUNTER — Emergency Department (HOSPITAL_COMMUNITY): Payer: PRIVATE HEALTH INSURANCE

## 2016-12-05 ENCOUNTER — Emergency Department (HOSPITAL_COMMUNITY)
Admission: EM | Admit: 2016-12-05 | Discharge: 2016-12-05 | Disposition: A | Discharge status: Home / Self Care | Payer: PRIVATE HEALTH INSURANCE | Attending: Emergency Medicine | Admitting: Emergency Medicine

## 2016-12-05 DIAGNOSIS — R52 Pain, unspecified: Secondary | ICD-10-CM

## 2016-12-05 DIAGNOSIS — R1030 Lower abdominal pain, unspecified: Secondary | ICD-10-CM | POA: Diagnosis not present

## 2016-12-05 DIAGNOSIS — Z3A01 Less than 8 weeks gestation of pregnancy: Secondary | ICD-10-CM | POA: Diagnosis not present

## 2016-12-05 DIAGNOSIS — O26891 Other specified pregnancy related conditions, first trimester: Secondary | ICD-10-CM | POA: Diagnosis present

## 2016-12-05 DIAGNOSIS — J45909 Unspecified asthma, uncomplicated: Secondary | ICD-10-CM | POA: Insufficient documentation

## 2016-12-05 DIAGNOSIS — Z79899 Other long term (current) drug therapy: Secondary | ICD-10-CM | POA: Insufficient documentation

## 2016-12-05 LAB — URINALYSIS, ROUTINE W REFLEX MICROSCOPIC
Bilirubin Urine: NEGATIVE
GLUCOSE, UA: NEGATIVE mg/dL
HGB URINE DIPSTICK: NEGATIVE
Ketones, ur: NEGATIVE mg/dL
NITRITE: NEGATIVE
Protein, ur: NEGATIVE mg/dL
SPECIFIC GRAVITY, URINE: 1.015 (ref 1.005–1.030)
pH: 5 (ref 5.0–8.0)

## 2016-12-05 LAB — WET PREP, GENITAL
SPERM: NONE SEEN
Trich, Wet Prep: NONE SEEN
Yeast Wet Prep HPF POC: NONE SEEN

## 2016-12-05 LAB — HCG, QUANTITATIVE, PREGNANCY: HCG, BETA CHAIN, QUANT, S: 2720 m[IU]/mL — AB (ref <5)

## 2016-12-05 MED ORDER — PRENATAL VITAMINS 0.8 MG PO TABS: 1.0000 | ORAL_TABLET | Freq: Every day | ORAL | 2 refills | Status: DC

## 2016-12-05 NOTE — Discharge Instructions (Signed)
[facility] in 2 weeks.

## 2016-12-05 NOTE — ED Triage Notes (Signed)
Patient is complaining of lower abdominal pain. Patient states she think she is pregnant and is concerned.

## 2016-12-05 NOTE — ED Provider Notes (Signed)
[facility]-EMERGENCY DEPT Provider Note   CSN: [id number] Arrival date & time: 12/05/16  [id number]     History   Chief Complaint Chief Complaint  Patient presents with  . Possible Pregnancy    HPI Julie Cardenas is a 31 y.o. female.  The history is provided by the patient.  Possible Pregnancy  This is a new problem. The current episode started 2 days ago. The problem occurs constantly. The problem has been gradually worsening. Nothing aggravates the symptoms. Nothing relieves the symptoms. She has tried nothing for the symptoms. The treatment provided no relief.   Pt complains of lower abdominal pain.  Pt report she is early pregnant.  This is patients first pregnancy Past Medical History:  Diagnosis Date  . Anxiety    no medicine, prayers aid her   . Asthma   . Bronchitis   . Depression   . Family history of anesthesia complication    mother slow waking up  . Headache(784.0)    sinus related  . Leg cramps    PCP- advised her to exercise   . Seasonal allergies   . Shortness of breath     Patient Active Problem List   Diagnosis Date Noted  . Major depressive disorder, recurrent episode, moderate ([facility]) 12/05/2015  . MDD (major depressive disorder), recurrent severe, 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/2015  . Major depressive disorder, recurrent episode, moderate degree ([facility]) 12/05/2015  . Depression 10/04/2014  . Generalized anxiety disorder 10/04/2014  . Nasal polyps 09/13/2013  . Sinusitis, chronic 09/13/2013    Past Surgical History:  Procedure Laterality Date  . SINUS ENDO W/FUSION Right 09/13/2013   Procedure: RIGHT ENDOSCOPIC SINUS SURGERY WITH FUSION SCAN;  Surgeon: Osborn Cohoavid Shoemaker, MD;  [facility];  Service: ENT;  Laterality: Right;  . SINUS ENDO W/FUSION Left 02/01/2014   Procedure: LEFT ENDOSCOPIC SINUS SURGERY/NASAL POLYPECTOMY WITH FUSION;  Surgeon: Osborn Cohoavid Shoemaker, MD;  [facility];  Service: ENT;  Laterality: Left;  . sinus polyps removed      OB History      No data available       Home Medications    Prior to Admission medications   Medication Sig Start Date End Date Taking? Authorizing Provider  albuterol (PROVENTIL HFA;VENTOLIN HFA) 108 (90 Base) MCG/ACT inhaler Inhale 2 puffs into the lungs every 4 (four) hours as needed for wheezing or shortness of breath. 09/24/16  Yes Graciella FreerLayden, Lindsey A, PA-C  azithromycin (ZITHROMAX) 250 MG tablet Take 1 tablet (250 mg total) by mouth daily. Take first 2 tablets together, then 1 every day until finished. Patient not taking: Reported on 12/05/2016 09/24/16   Graciella FreerLayden, Lindsey A, PA-C  fluticasone furoate-vilanterol (BREO ELLIPTA) 100-25 MCG/INH AEPB Inhale 1 puff into the lungs daily. Patient not taking: Reported on 12/05/2016 09/24/16   Graciella FreerLayden, Lindsey A, PA-C  hydrOXYzine (ATARAX/VISTARIL) 25 MG tablet Take 1 tablet (25 mg total) by mouth every 6 (six) hours as needed for anxiety. Patient not taking: Reported on 12/05/2016 12/06/15   Oneta RackLewis, Tanika N, NP  mirtazapine (REMERON) 7.5 MG tablet Take 1 tablet (7.5 mg total) by mouth at bedtime. Patient not taking: Reported on 12/05/2016 12/06/15   Oneta RackLewis, Tanika N, NP  naphazoline (NAPHCON) 0.1 % ophthalmic solution Place 1 drop into both eyes 3 (three) times daily. Patient not taking: Reported on 12/05/2016 09/24/16   Maxwell CaulLayden, Lindsey A, PA-C  Prenatal Multivit-Min-Fe-FA (PRENATAL VITAMINS) 0.8 MG tablet Take 1 tablet by mouth daily. 12/05/16   Elson AreasSofia, Kristiann Noyce K, PA-C  Family History Family History  Problem Relation Age of Onset  . Diabetes Mother   . Cancer Mother   . Hypertension Mother     Social History Social History  Substance Use Topics  . Smoking status: Never Smoker  . Smokeless tobacco: Never Used  . Alcohol use Yes     Comment: occas.     Allergies   Naproxen   Review of Systems Review of Systems  All other systems reviewed and are negative.    Physical Exam Updated Vital Signs BP (!) 130/59 (BP Location: Left Arm)   Pulse 86   Temp 97.8  F (36.6 C) (Oral)   Resp 16   Ht 5\' 5"  (1.651 m)   Wt 108.9 kg (240 lb)   LMP  (LMP Unknown)   SpO2 100%   BMI 39.94 kg/m   Physical Exam  Constitutional: She appears well-developed and well-nourished.  HENT:  Head: Normocephalic and atraumatic.  Eyes: Conjunctivae are normal. Pupils are equal, round, and reactive to light.  Neck: Normal range of motion. Neck supple.  Cardiovascular: Normal rate.   Abdominal: Soft. There is no tenderness.  Genitourinary: Vaginal discharge found.  Genitourinary Comments: Vaginal discharge,  Thick white,  Adnexa no masses,  Cervix nontender  Musculoskeletal: Normal range of motion.  Skin: Skin is warm.     ED Treatments / Results  Labs (all labs ordered are listed, but only abnormal results are displayed) Labs Reviewed  WET PREP, GENITAL - Abnormal; Notable for the following:       Result Value   Clue Cells Wet Prep HPF POC RARE (*)    WBC, Wet Prep HPF POC RARE (*)    All other components within normal limits  HCG, QUANTITATIVE, PREGNANCY - Abnormal; Notable for the following:    hCG, Beta Chain, Quant, S 2,720 (*)    All other components within normal limits  URINALYSIS, ROUTINE W REFLEX MICROSCOPIC - Abnormal; Notable for the following:    Leukocytes, UA SMALL (*)    Bacteria, UA RARE (*)    Squamous Epithelial / LPF 0-5 (*)    All other components within normal limits  RPR  HIV ANTIBODY (ROUTINE TESTING)  GC/CHLAMYDIA PROBE AMP ([place]) NOT AT [facility]    EKG  EKG Interpretation None       Radiology US Ob Comp Less 14 Wks  Result Date: 12/05/2016 CLINICAL DATA:  Pelvic pain in first-trimester pregnancy. EXAM: OBSTETRIC <14 WK [place] AND TRANSVAGINAL OB US TECHNIQUE: Both transabdominal and transvaginal ultrasound examinations were performed for complete evaluation of the gestation as well as the maternal uterus, adnexal regions, and pelvic cul-de-sac. Transvaginal technique was performed to assess early pregnancy. COMPARISON:   None. FINDINGS: Intrauterine gestational sac: Possible single sac in the left upper endometrial cavity Yolk sac:  None Embryo:  None MSD: 8.4  mm   5 w   4  d Subchorionic hemorrhage:  None visualized. Maternal uterus/adnexae: Physiologic appearance to the ovaries. No adnexal mass. IMPRESSION: 1. Probable intrauterine gestational sac measuring 5 weeks 4 days, but no visible yolk sac or embryo. Recommend follow-up quantitative B-HCG levels and follow-up US in 14 days to assess viability. This recommendation follows SRU consensus guidelines: Diagnostic Criteria for Nonviable Pregnancy Early in the First Trimester. Malva Limes Med 2013; 606:3016-01. 2. No adnexal mass or pelvic fluid. Electronically Signed   By: Marnee Spring M.D.   On: 12/05/2016 11:07   US Ob Transvaginal  Result Date: 12/05/2016 CLINICAL DATA:  Pelvic pain in first-trimester pregnancy. EXAM: OBSTETRIC <14 WK [place] AND TRANSVAGINAL OB US TECHNIQUE: Both transabdominal and transvaginal ultrasound examinations were performed for complete evaluation of the gestation as well as the maternal uterus, adnexal regions, and pelvic cul-de-sac. Transvaginal technique was performed to assess early pregnancy. COMPARISON:  None. FINDINGS: Intrauterine gestational sac: Possible single sac in the left upper endometrial cavity Yolk sac:  None Embryo:  None MSD: 8.4  mm   5 w   4  d Subchorionic hemorrhage:  None visualized. Maternal uterus/adnexae: Physiologic appearance to the ovaries. No adnexal mass. IMPRESSION: 1. Probable intrauterine gestational sac measuring 5 weeks 4 days, but no visible yolk sac or embryo. Recommend follow-up quantitative B-HCG levels and follow-up US in 14 days to assess viability. This recommendation follows SRU consensus guidelines: Diagnostic Criteria for Nonviable Pregnancy Early in the First Trimester. Malva Limes Med 2013; 161:0960-45. 2. No adnexal mass or pelvic fluid. Electronically Signed   By: Marnee Spring M.D.   On: 12/05/2016  11:07    Procedures Procedures (including critical care time)  Medications Ordered in ED Medications - No data to display   Initial Impression / Assessment and Plan / ED Course  I have reviewed the triage vital signs and the nursing notes.  Pertinent labs & imaging results that were available during my care of the patient were reviewed by me and considered in my medical decision making (see chart for details).     recheck at [facility] in 2 weeks.  Pt counseled on need for follow up.   Pt advised to  Final Clinical Impressions(s) / ED Diagnoses   Final diagnoses:  Less than [redacted] weeks gestation of pregnancy    New Prescriptions Discharge Medication List as of 12/05/2016 12:33 PM    START taking these medications   Details  Prenatal Multivit-Min-Fe-FA (PRENATAL VITAMINS) 0.8 MG tablet Take 1 tablet by mouth daily., Starting Sun 12/05/2016, Print      An After Visit Summary was printed and given to the patient.   Elson Areas, PA-C 12/05/16 1608    Charlynne Pander, MD 12/06/16 [phone]

## 2016-12-06 LAB — GC/CHLAMYDIA PROBE AMP (~~LOC~~) NOT AT ARMC
CHLAMYDIA, DNA PROBE: NEGATIVE
Neisseria Gonorrhea: NEGATIVE

## 2016-12-06 LAB — RPR: RPR: NONREACTIVE

## 2016-12-06 LAB — HIV ANTIBODY (ROUTINE TESTING W REFLEX): HIV SCREEN 4TH GENERATION: NONREACTIVE

## 2016-12-12 ENCOUNTER — Inpatient Hospital Stay (HOSPITAL_COMMUNITY): Payer: PRIVATE HEALTH INSURANCE

## 2016-12-12 ENCOUNTER — Inpatient Hospital Stay (HOSPITAL_COMMUNITY)
Admission: AD | Admit: 2016-12-12 | Discharge: 2016-12-13 | Disposition: A | Discharge status: Home / Self Care | Payer: PRIVATE HEALTH INSURANCE | Source: Ambulatory Visit | Attending: Obstetrics & Gynecology | Admitting: Obstetrics & Gynecology

## 2016-12-12 ENCOUNTER — Encounter (HOSPITAL_COMMUNITY): Payer: Self-pay

## 2016-12-12 DIAGNOSIS — Z3A01 Less than 8 weeks gestation of pregnancy: Secondary | ICD-10-CM | POA: Diagnosis not present

## 2016-12-12 DIAGNOSIS — O26891 Other specified pregnancy related conditions, first trimester: Secondary | ICD-10-CM

## 2016-12-12 DIAGNOSIS — O209 Hemorrhage in early pregnancy, unspecified: Secondary | ICD-10-CM

## 2016-12-12 DIAGNOSIS — O4691 Antepartum hemorrhage, unspecified, first trimester: Secondary | ICD-10-CM | POA: Diagnosis not present

## 2016-12-12 DIAGNOSIS — R109 Unspecified abdominal pain: Secondary | ICD-10-CM

## 2016-12-12 DIAGNOSIS — O26899 Other specified pregnancy related conditions, unspecified trimester: Secondary | ICD-10-CM

## 2016-12-12 DIAGNOSIS — O208 Other hemorrhage in early pregnancy: Secondary | ICD-10-CM | POA: Diagnosis not present

## 2016-12-12 DIAGNOSIS — O3680X0 Pregnancy with inconclusive fetal viability, not applicable or unspecified: Secondary | ICD-10-CM

## 2016-12-12 DIAGNOSIS — Z3491 Encounter for supervision of normal pregnancy, unspecified, first trimester: Secondary | ICD-10-CM

## 2016-12-12 LAB — URINALYSIS, ROUTINE W REFLEX MICROSCOPIC
BILIRUBIN URINE: NEGATIVE
GLUCOSE, UA: NEGATIVE mg/dL
KETONES UR: NEGATIVE mg/dL
LEUKOCYTES UA: NEGATIVE
NITRITE: NEGATIVE
PH: 5 (ref 5.0–8.0)
Protein, ur: NEGATIVE mg/dL
SPECIFIC GRAVITY, URINE: 1.008 (ref 1.005–1.030)

## 2016-12-12 LAB — CBC
HEMATOCRIT: 37 % (ref 36.0–46.0)
Hemoglobin: 12.5 g/dL (ref 12.0–15.0)
MCH: 27.8 pg (ref 26.0–34.0)
MCHC: 33.8 g/dL (ref 30.0–36.0)
MCV: 82.4 fL (ref 78.0–100.0)
Platelets: 300 10*3/uL (ref 150–400)
RBC: 4.49 MIL/uL (ref 3.87–5.11)
RDW: 14.7 % (ref 11.5–15.5)
WBC: 8.3 10*3/uL (ref 4.0–10.5)

## 2016-12-12 LAB — HCG, QUANTITATIVE, PREGNANCY: hCG, Beta Chain, Quant, S: 3597 m[IU]/mL — ABNORMAL HIGH (ref <5)

## 2016-12-12 NOTE — MAU Note (Signed)
Pt. States that she has had spotting today that started at 0800 and has had abdominal cramping intermittently x 1 week. [facility], had positive UPT and HCG quant.

## 2016-12-13 LAB — ABO/RH: ABO/RH(D): B POS

## 2016-12-13 NOTE — Discharge Instructions (Signed)
Threatened Miscarriage °A threatened miscarriage occurs when you have vaginal bleeding during your first 20 weeks of pregnancy but the pregnancy has not ended. If you have vaginal bleeding during this time, your health care provider will do tests to make sure you are still pregnant. If the tests show you are still pregnant and the developing baby (fetus) inside your womb (uterus) is still growing, your condition is considered a threatened miscarriage. °A threatened miscarriage does not mean your pregnancy will end, but it does increase the risk of losing your pregnancy (complete miscarriage). °What are the causes? °The cause of a threatened miscarriage is usually not known. If you go on to have a complete miscarriage, the most common cause is an abnormal number of chromosomes in the developing baby. Chromosomes are the structures inside cells that hold all your genetic material. °Some causes of vaginal bleeding that do not result in miscarriage include: °· Having sex. °· Having an infection. °· Normal hormone changes of pregnancy. °· Bleeding that occurs when an egg implants in your uterus. ° °What increases the risk? °Risk factors for bleeding in early pregnancy include: °· Obesity. °· Smoking. °· Drinking excessive amounts of alcohol or caffeine. °· Recreational drug use. ° °What are the signs or symptoms? °· Light vaginal bleeding. °· Mild abdominal pain or cramps. °How is this diagnosed? °If you have bleeding with or without abdominal pain before 20 weeks of pregnancy, your health care provider will do tests to check whether you are still pregnant. One important test involves using sound waves and a computer (ultrasound) to create images of the inside of your uterus. Other tests include an internal exam of your vagina and uterus (pelvic exam) and measurement of your baby’s heart rate. °You may be diagnosed with a threatened miscarriage if: °· Ultrasound testing shows you are still pregnant. °· Your baby’s heart  rate is strong. °· A pelvic exam shows that the opening between your uterus and your vagina (cervix) is closed. °· Your heart rate and blood pressure are stable. °· Blood tests confirm you are still pregnant. ° °How is this treated? °No treatments have been shown to prevent a threatened miscarriage from going on to a complete miscarriage. However, the right home care is important. °Follow these instructions at home: °· Make sure you keep all your appointments for prenatal care. This is very important. °· Get plenty of rest. °· Do not have sex or use tampons if you have vaginal bleeding. °· Do not douche. °· Do not smoke or use recreational drugs. °· Do not drink alcohol. °· Avoid caffeine. °Contact a health care provider if: °· You have light vaginal bleeding or spotting while pregnant. °· You have abdominal pain or cramping. °· You have a fever. °Get help right away if: °· You have heavy vaginal bleeding. °· You have blood clots coming from your vagina. °· You have severe low back pain or abdominal cramps. °· You have fever, chills, and severe abdominal pain. °This information is not intended to replace advice given to you by your health care provider. Make sure you discuss any questions you have with your health care provider. °Document Released: 05/17/2005 Document Revised: 10/23/2015 Document Reviewed: 03/13/2013 °Elsevier Interactive Patient Education © 2018 Elsevier Inc. ° °

## 2016-12-13 NOTE — MAU Provider Note (Signed)
History    First Provider Initiated Contact with Patient 12/12/16 2207      Chief Complaint:  Vaginal Bleeding   Julie Cardenas is  31 y.o. G1P0 Patient's last menstrual period was 10/27/2016 (approximate).. Patient is here for follow up of quantitative HCG and ongoing surveillance of pregnancy status.   She is [redacted]w[redacted]d weeks gestation  by LMP.    Since her last visit, the patient is with new complaint.  She was seen at [facility] ED for abd pain on 12/05/16 US showed possible GS, no YS, FP.   ROS Abdomin Pain: Mild cramping Vaginal bleeding: spotting.   Passage of clots or tissue: None Dizziness: None  B POS  Her previous Quantitative HCG values are:  Results for ANDRIAN, URBACH (MRN [id number]) as of 12/14/2016 03:56  Ref. Range 12/05/2016 07:15  HCG, Beta Chain, Quant, S Latest Ref Range: <5 mIU/mL 2,720 (H)    Physical Exam   Patient Vitals for the past 24 hrs:  BP Temp Temp src Pulse Resp Height Weight  12/12/16 2340 (!) 100/47 - - - - - -  12/12/16 2306 (!) 56/21 - - 77 - - -  12/12/16 2138 - - - - - 5\' 5"  (1.651 m) 240 lb (108.9 kg)  12/12/16 2131 116/66 97.8 F (36.6 C) Oral 73 18 - -   Constitutional: Well-nourished female in no apparent distress. No pallor Neuro: Alert and oriented 4 Cardiovascular: Normal rate Respiratory: Normal effort and rate Abdomen: Soft, nontender Gynecological Exam: normal external genitalia, vulva, vagina, cervix, uterus and adnexa, small amount of pinkish-tan blood  Labs: Results for orders placed or performed during the hospital encounter of 12/12/16 (from the past 24 hour(s))  hCG, quantitative, pregnancy   Collection Time: 12/12/16  9:57 PM  Result Value Ref Range   hCG, Beta Chain, Quant, S 3,597 (H) <5 mIU/mL  CBC   Collection Time: 12/12/16  9:57 PM  Result Value Ref Range   WBC 8.3 4.0 - 10.5 K/uL   RBC 4.49 3.87 - 5.11 MIL/uL   Hemoglobin 12.5 12.0 - 15.0 g/dL   HCT 30.8 65.7 - 84.6 %   MCV 82.4 78.0 - 100.0 fL   MCH 27.8 26.0 - 34.0  pg   MCHC 33.8 30.0 - 36.0 g/dL   RDW 96.2 95.2 - 84.1 %   Platelets 300 150 - 400 K/uL  ABO/Rh   Collection Time: 12/12/16  9:57 PM  Result Value Ref Range   ABO/RH(D) B POS   Urinalysis, Routine w reflex microscopic   Collection Time: 12/12/16 10:00 PM  Result Value Ref Range   Color, Urine STRAW (A) YELLOW   APPearance CLEAR CLEAR   Specific Gravity, Urine 1.008 1.005 - 1.030   pH 5.0 5.0 - 8.0   Glucose, UA NEGATIVE NEGATIVE mg/dL   Hgb urine dipstick LARGE (A) NEGATIVE   Bilirubin Urine NEGATIVE NEGATIVE   Ketones, ur NEGATIVE NEGATIVE mg/dL   Protein, ur NEGATIVE NEGATIVE mg/dL   Nitrite NEGATIVE NEGATIVE   Leukocytes, UA NEGATIVE NEGATIVE   RBC / HPF 6-30 0 - 5 RBC/hpf   WBC, UA 6-30 0 - 5 WBC/hpf   Bacteria, UA RARE (A) NONE SEEN   Squamous Epithelial / LPF 0-5 (A) NONE SEEN   Mucous PRESENT     Ultrasound Studies:   US Ob Comp Less 14 Wks  Result Date: 12/05/2016 CLINICAL DATA:  Pelvic pain in first-trimester pregnancy. EXAM: OBSTETRIC <14 WK [place] AND TRANSVAGINAL OB US TECHNIQUE: Both transabdominal  and transvaginal ultrasound examinations were performed for complete evaluation of the gestation as well as the maternal uterus, adnexal regions, and pelvic cul-de-sac. Transvaginal technique was performed to assess early pregnancy. COMPARISON:  None. FINDINGS: Intrauterine gestational sac: Possible single sac in the left upper endometrial cavity Yolk sac:  None Embryo:  None MSD: 8.4  mm   5 w   4  d Subchorionic hemorrhage:  None visualized. Maternal uterus/adnexae: Physiologic appearance to the ovaries. No adnexal mass. IMPRESSION: 1. Probable intrauterine gestational sac measuring 5 weeks 4 days, but no visible yolk sac or embryo. Recommend follow-up quantitative B-HCG levels and follow-up US in 14 days to assess viability. This recommendation follows SRU consensus guidelines: Diagnostic Criteria for Nonviable Pregnancy Early in the First Trimester. Malva Limes Engl J Med 2013;  [id number];  369:1443-51. 2. No adnexal mass or pelvic fluid. Electronically Signed   By: Marnee SpringJonathon  Watts M.D.   On: 12/05/2016 11:07   [place] Ob Transvaginal  Result Date: 12/12/2016 CLINICAL DATA:  Vaginal bleeding. Gestational age by last menstrual period 6 weeks and 4 days. Beta HCG 3,597. EXAM: TRANSVAGINAL OB ULTRASOUND TECHNIQUE: Transvaginal ultrasound was performed for complete evaluation of the gestation as well as the maternal uterus, adnexal regions, and pelvic cul-de-sac. COMPARISON:  None. FINDINGS: Intrauterine gestational sac: Present Yolk sac:  Present Embryo:  Present Cardiac Activity: Not present CRL:   2.3  mm   5 w 5 d Subchorionic hemorrhage:  None visualized. Maternal uterus/adnexae: LEFT ovary not sonographically identified. Unremarkable RIGHT ovary. IMPRESSION: IUP measuring 5 weeks and 5 days without cardiac activity. Findings are suspicious but not yet definitive for failed pregnancy. Recommend follow-up US in 10-14 days for definitive diagnosis. This recommendation follows SRU consensus guidelines: Diagnostic Criteria for Nonviable Pregnancy Early in the First Trimester. Malva Limes Engl J Med 2013; [id number]; 369:[id number]. Electronically Signed   By: Awilda Metroourtnay  Bloomer M.D.   On: 12/12/2016 23:06   [place] Ob Transvaginal  Result Date: 12/05/2016 CLINICAL DATA:  Pelvic pain in first-trimester pregnancy. EXAM: OBSTETRIC <14 WK US AND TRANSVAGINAL OB US TECHNIQUE: Both transabdominal and transvaginal ultrasound examinations were performed for complete evaluation of the gestation as well as the maternal uterus, adnexal regions, and pelvic cul-de-sac. Transvaginal technique was performed to assess early pregnancy. COMPARISON:  None. FINDINGS: Intrauterine gestational sac: Possible single sac in the left upper endometrial cavity Yolk sac:  None Embryo:  None MSD: 8.4  mm   5 w   4  d Subchorionic hemorrhage:  None visualized. Maternal uterus/adnexae: Physiologic appearance to the ovaries. No adnexal mass. IMPRESSION: 1. Probable  intrauterine gestational sac measuring 5 weeks 4 days, but no visible yolk sac or embryo. Recommend follow-up quantitative B-HCG levels and follow-up US in 14 days to assess viability. This recommendation follows SRU consensus guidelines: Diagnostic Criteria for Nonviable Pregnancy Early in the First Trimester. Malva Limes Engl J Med 2013; [id number]; 369:1443-51. 2. No adnexal mass or pelvic fluid. Electronically Signed   By: Marnee SpringJonathon  Watts M.D.   On: 12/05/2016 11:07    MAU course/MDM: Quantitative hCG, US, ABO/Rh ordered  Pain and bleeding in early pregnancy with normal rise in Quant and hemodynamically stable.  Pain and bleeding in early pregnancy with abnormal rise in Quant, but IUP confirmed and hemodynamically stable. No cardiac activity seen. May be due to early GA although there is concern for failed pregnancy.   Assessment:  1. Normal IUP (intrauterine pregnancy) on prenatal ultrasound, first trimester   2. Abdominal pain affecting pregnancy, antepartum   3. Vaginal bleeding affecting early  pregnancy      Plan: Discharge home in stable condition. Bleeding precautions Pelvic rest x 1 week. Follow-up Information    Obstetrician of your choice Follow up.   Why:  Start prenatal care       [facility] [place] MATERNITY ADMISSIONS Follow up.   Why:  in pregnancy emergencies Contact information: [address] [id number] mc [place] [address] [phone]       [facility] Follow up.   Specialty:  Obstetrics and Gynecology Why:  as needed if symptoms worsen Contact information: [address] [place] [address] [phone]         Allergies as of 12/13/2016      Reactions   Naproxen Diarrhea, Nausea And Vomiting, Other (See Comments)   Severe abdominal pain       Medication List    STOP taking these medications   azithromycin 250 MG tablet Commonly known as:  ZITHROMAX   mirtazapine 7.5 MG  tablet Commonly known as:  REMERON   naphazoline 0.1 % ophthalmic solution Commonly known as:  NAPHCON     TAKE these medications   albuterol 108 (90 Base) MCG/ACT inhaler Commonly known as:  PROVENTIL HFA;VENTOLIN HFA Inhale 2 puffs into the lungs every 4 (four) hours as needed for wheezing or shortness of breath.   fluticasone furoate-vilanterol 100-25 MCG/INH Aepb Commonly known as:  BREO ELLIPTA Inhale 1 puff into the lungs daily.   hydrOXYzine 25 MG tablet Commonly known as:  ATARAX/VISTARIL Take 1 tablet (25 mg total) by mouth every 6 (six) hours as needed for anxiety.   Prenatal Vitamins 0.8 MG tablet Take 1 tablet by mouth daily.       Katrinka Blazing, [place], CNM 12/13/2016, 12:02 AM  2/3

## 2016-12-20 ENCOUNTER — Inpatient Hospital Stay (HOSPITAL_COMMUNITY)
Admission: AD | Admit: 2016-12-20 | Discharge: 2016-12-20 | Disposition: A | Discharge status: Home / Self Care | Payer: PRIVATE HEALTH INSURANCE | Source: Ambulatory Visit | Attending: Family Medicine | Admitting: Family Medicine

## 2016-12-20 ENCOUNTER — Inpatient Hospital Stay (HOSPITAL_COMMUNITY): Payer: PRIVATE HEALTH INSURANCE

## 2016-12-20 ENCOUNTER — Encounter (HOSPITAL_COMMUNITY): Payer: Self-pay | Admitting: *Deleted

## 2016-12-20 DIAGNOSIS — O034 Incomplete spontaneous abortion without complication: Secondary | ICD-10-CM | POA: Insufficient documentation

## 2016-12-20 DIAGNOSIS — Z3A01 Less than 8 weeks gestation of pregnancy: Secondary | ICD-10-CM | POA: Insufficient documentation

## 2016-12-20 DIAGNOSIS — O209 Hemorrhage in early pregnancy, unspecified: Secondary | ICD-10-CM

## 2016-12-20 DIAGNOSIS — O4691 Antepartum hemorrhage, unspecified, first trimester: Secondary | ICD-10-CM | POA: Diagnosis present

## 2016-12-20 LAB — URINALYSIS, ROUTINE W REFLEX MICROSCOPIC
Bilirubin Urine: NEGATIVE
Glucose, UA: NEGATIVE mg/dL
Ketones, ur: NEGATIVE mg/dL
Nitrite: NEGATIVE
PROTEIN: NEGATIVE mg/dL
Specific Gravity, Urine: 1.004 — ABNORMAL LOW (ref 1.005–1.030)
pH: 5 (ref 5.0–8.0)

## 2016-12-20 MED ORDER — IBUPROFEN 600 MG PO TABS: 600.0000 mg | ORAL_TABLET | Freq: Four times a day (QID) | ORAL | 0 refills | Status: DC | PRN

## 2016-12-20 MED ORDER — MISOPROSTOL 200 MCG PO TABS: ORAL_TABLET | ORAL | 0 refills | Status: DC

## 2016-12-20 MED ORDER — PROMETHAZINE HCL 12.5 MG PO TABS: 12.5000 mg | ORAL_TABLET | Freq: Four times a day (QID) | ORAL | 0 refills | Status: DC | PRN

## 2016-12-20 MED ORDER — OXYCODONE-ACETAMINOPHEN 5-325 MG PO TABS: 1.0000 | ORAL_TABLET | Freq: Four times a day (QID) | ORAL | 0 refills | Status: DC | PRN

## 2016-12-20 NOTE — MAU Note (Signed)
started bleeding last Sunday, the 15th.  Bleeding has continued. More than it was but still just needing a pantiliner

## 2016-12-20 NOTE — Discharge Instructions (Signed)
Incomplete Miscarriage °A miscarriage is the sudden loss of an unborn baby (fetus) before the 20th week of pregnancy. In an incomplete miscarriage, parts of the fetus or placenta (afterbirth) remain in the body. °Having a miscarriage can be an emotional experience. Talk with your health care provider about any questions you may have about miscarrying, the grieving process, and your future pregnancy plans. °What are the causes? °· Problems with the fetal chromosomes that make it impossible for the baby to develop normally. Problems with the baby's genes or chromosomes are most often the result of errors that occur by chance as the embryo divides and grows. The problems are not inherited from the parents. °· Infection of the cervix or uterus. °· Hormone problems. °· Problems with the cervix, such as having an incompetent cervix. This is when the tissue in the cervix is not strong enough to hold the pregnancy. °· Problems with the uterus, such as an abnormally shaped uterus, uterine fibroids, or congenital abnormalities. °· Certain medical conditions. °· Smoking, drinking alcohol, or taking illegal drugs. °· Trauma. °What are the signs or symptoms? °· Vaginal bleeding or spotting, with or without cramps or pain. °· Pain or cramping in the abdomen or lower back. °· Passing fluid, tissue, or blood clots from the vagina. °How is this diagnosed? °Your health care provider will perform a physical exam. You may also have an ultrasound to confirm the miscarriage. Blood or urine tests may also be ordered. °How is this treated? °· Usually, a dilation and curettage (D&C) procedure is performed. During a D&C procedure, the cervix is widened (dilated) and any remaining fetal or placental tissue is gently removed from the uterus. °· Antibiotic medicines are prescribed if there is an infection. Other medicines may be given to reduce the size of the uterus (contract) if there is a lot of bleeding. °· If you have Rh negative blood and  your baby was Rh positive, you will need a Rho (D) immune globulin shot. This shot will protect any future baby from having Rh blood problems in future pregnancies. °· You may be confined to bed rest. This means you should stay in bed and only get up to use the bathroom. °Follow these instructions at home: °· Rest as directed by your health care provider. °· Restrict activity as directed by your health care provider. You may be allowed to continue light activity if curettage was not done but you require further treatment. °· Keep track of the number of pads you use each day. Keep track of how soaked (saturated) they are. Record this information. °· Do not  use tampons. °· Do not douche or have sexual intercourse until approved by your health care provider. °· Keep all follow-up appointments for reevaluation and continuing management. °· Only take over-the-counter or prescription medicines for pain, fever, or discomfort as directed by your health care provider. °· Take antibiotic medicine as directed by your health care provider. Make sure you finish it even if you start to feel better. °Get help right away if: °· You experience severe cramps in your stomach, back, or abdomen. °· You have an unexplained temperature (make sure to record these temperatures). °· You pass large clots or tissue (save these for your health care provider to inspect). °· Your bleeding increases. °· You become light-headed, weak, or have fainting episodes. °This information is not intended to replace advice given to you by your health care provider. Make sure you discuss any questions you have with your health   care provider. Document Released: 05/17/2005 Document Revised: 10/23/2015 Document Reviewed: 12/14/2012 Elsevier Interactive Patient Education  2017 Elsevier Inc.   Pelvic Rest Pelvic rest may be recommended if:  Your placenta is partially or completely covering the opening of your cervix (placenta previa).  There is bleeding  between the wall of the uterus and the amniotic sac in the first trimester of pregnancy (subchorionic hemorrhage).  You went into labor too early (preterm labor).  Based on your overall health and the health of your baby, your health care provider will decide if pelvic rest is right for you. How do I rest my pelvis? For as long as told by your health care provider:  Do not have sex, sexual stimulation, or an orgasm.  Do not use tampons. Do not douche. Do not put anything in your vagina.  Do not lift anything that is heavier than 10 lb (4.5 kg).  Avoid activities that take a lot of effort (are strenuous).  Avoid any activity in which your pelvic muscles could become strained.  When should I seek medical care? Seek medical care if you have:  Cramping pain in your lower abdomen.  Vaginal discharge.  A low, dull backache.  Regular contractions.  Uterine tightening.  When should I seek immediate medical care? Seek immediate medical care if:  You have vaginal bleeding and you are pregnant.  This information is not intended to replace advice given to you by your health care provider. Make sure you discuss any questions you have with your health care provider. Document Released: 09/11/2010 Document Revised: 10/23/2015 Document Reviewed: 11/18/2014 Elsevier Interactive Patient Education  [employer]2018 Elsevier Inc.

## 2016-12-20 NOTE — MAU Provider Note (Signed)
History     CSN: [id number]  Arrival date and time: 12/20/16 1224   First Provider Initiated Contact with Patient 12/20/16 1304      Chief Complaint  Patient presents with  . Vaginal Bleeding   HPI Ms. Julie Cardenas is a 31 y.o. G1P0 at [redacted]w[redacted]d who presents to MAU today with complaint of vaginal bleeding x 8 days. The patient was seen in MAU with onset of bleeding last week. She had [place] that showed SIUP without FHR at [redacted]w[redacted]d. She states bleeding has continued. It is slightly heavier now with small clots, but she is still needing no more than a panty liner to manage her bleeding. She has had cramping off and on associated with the bleeding. She does not take anything for pain. She denies fever or UTI symptoms. She has had nausea without vomiting.   OB History    Gravida Para Term Preterm AB Living   1             SAB TAB Ectopic Multiple Live Births                  Past Medical History:  Diagnosis Date  . Anxiety    no medicine, prayers aid her   . Asthma   . Bronchitis   . Depression   . Family history of anesthesia complication    mother slow waking up  . Headache(784.0)    sinus related  . Leg cramps    PCP- advised her to exercise   . Seasonal allergies   . Shortness of breath     Past Surgical History:  Procedure Laterality Date  . SINUS ENDO W/FUSION Right 09/13/2013   Procedure: RIGHT ENDOSCOPIC SINUS SURGERY WITH FUSION SCAN;  Surgeon: Osborn Coho, MD;  Location: [facility] OR;  Service: ENT;  Laterality: Right;  . SINUS ENDO W/FUSION Left 02/01/2014   Procedure: LEFT ENDOSCOPIC SINUS SURGERY/NASAL POLYPECTOMY WITH FUSION;  Surgeon: Osborn Coho, MD;  Location: [facility] OR;  Service: ENT;  Laterality: Left;  . sinus polyps removed      Family History  Problem Relation Age of Onset  . Diabetes Mother   . Cancer Mother   . Hypertension Mother     Social History  Substance Use Topics  . Smoking status: Never Smoker  . Smokeless tobacco: Never Used  . Alcohol use Yes      Comment: occas.    Allergies:  Allergies  Allergen Reactions  . Naproxen Diarrhea, Nausea And Vomiting and Other (See Comments)    Severe abdominal pain     Prescriptions Prior to Admission  Medication Sig Dispense Refill Last Dose  . albuterol (PROVENTIL HFA;VENTOLIN HFA) 108 (90 Base) MCG/ACT inhaler Inhale 2 puffs into the lungs every 4 (four) hours as needed for wheezing or shortness of breath. 6.7 g 0 12/04/2016 at Unknown time  . fluticasone furoate-vilanterol (BREO ELLIPTA) 100-25 MCG/INH AEPB Inhale 1 puff into the lungs daily. (Patient not taking: Reported on 12/05/2016) 28 each 0 Not Taking at Unknown time  . hydrOXYzine (ATARAX/VISTARIL) 25 MG tablet Take 1 tablet (25 mg total) by mouth every 6 (six) hours as needed for anxiety. (Patient not taking: Reported on 12/05/2016) 10 tablet 0 Not Taking at Unknown time  . Prenatal Multivit-Min-Fe-FA (PRENATAL VITAMINS) 0.8 MG tablet Take 1 tablet by mouth daily. 30 tablet 2     Review of Systems  Constitutional: Negative for fever.  Gastrointestinal: Positive for abdominal pain. Negative for constipation, diarrhea, nausea and vomiting.  Genitourinary: Positive for vaginal bleeding. Negative for dysuria, frequency, urgency and vaginal discharge.   Physical Exam   Blood pressure 135/69, pulse (!) 108, temperature 98 F (36.7 C), temperature source Oral, resp. rate 18, weight 241 lb 8 oz (109.5 kg), last menstrual period 10/27/2016, SpO2 100 %.  Physical Exam  Nursing note and vitals reviewed. Constitutional: She is oriented to person, place, and time. She appears well-developed and well-nourished. No distress.  HENT:  Head: Normocephalic and atraumatic.  Cardiovascular: Normal rate.   Respiratory: Effort normal.  GI: Soft. She exhibits no distension and no mass. There is no tenderness. There is no rebound and no guarding.  Neurological: She is alert and oriented to person, place, and time.  Skin: Skin is warm and dry. No  erythema.  Psychiatric: She has a normal mood and affect.    Results for orders placed or performed during the hospital encounter of 12/20/16 (from the past 24 hour(s))  Urinalysis, Routine w reflex microscopic     Status: Abnormal   Collection Time: 12/20/16 12:38 PM  Result Value Ref Range   Color, Urine STRAW (A) YELLOW   APPearance CLEAR CLEAR   Specific Gravity, Urine 1.004 (L) 1.005 - 1.030   pH 5.0 5.0 - 8.0   Glucose, UA NEGATIVE NEGATIVE mg/dL   Hgb urine dipstick MODERATE (A) NEGATIVE   Bilirubin Urine NEGATIVE NEGATIVE   Ketones, ur NEGATIVE NEGATIVE mg/dL   Protein, ur NEGATIVE NEGATIVE mg/dL   Nitrite NEGATIVE NEGATIVE   Leukocytes, UA MODERATE (A) NEGATIVE   RBC / HPF 0-5 0 - 5 RBC/hpf   WBC, UA 6-30 0 - 5 WBC/hpf   Bacteria, UA RARE (A) NONE SEEN   Squamous Epithelial / LPF 0-5 (A) NONE SEEN   US Ob Transvaginal  Result Date: 12/20/2016 CLINICAL DATA:  Vaginal bleeding in first trimester of pregnancy. EXAM: TRANSVAGINAL OB ULTRASOUND TECHNIQUE: Transvaginal ultrasound was performed for complete evaluation of the gestation as well as the maternal uterus, adnexal regions, and pelvic cul-de-sac. COMPARISON:  Ultrasound of December 12, 2016. FINDINGS: Intrauterine gestational sac: Not visualized. Yolk sac:  Not visualized. Embryo:  Not visualized. Cardiac Activity: Not visualized. Subchorionic hemorrhage:  None visualized. Maternal uterus/adnexae: Ovaries appear normal. Trace free fluid is noted. Hypoechoic material is noted within the cervical canal. IMPRESSION: Gestational sac and embryo noted on prior exam is no longer visualized, and hypoechoic material is noted in the cervical canal. This is consistent with incomplete abortion. Retained products of conception cannot be excluded. Electronically Signed   By: Lupita Raider, M.D.   On: 12/20/2016 13:59    MAU Course  Procedures None  MDM UA and [place] today  US shows that previously seen IUP is no longer visualized.  Patient  offered Cytotec due to possible retained products of conception. Patient agrees with plan. Bleeding precautions reviewed.   Early Intrauterine Pregnancy Failure  __x_  Documented intrauterine pregnancy failure less than or equal to [redacted] weeks gestation  _x__  No serious current illness  _x__  Baseline Hgb greater than or equal to 10g/dl  _x__  Patient has easily accessible transportation to the hospital  _x__  Clear preference  _x__  Practitioner/physician deems patient reliable  _x__  Counseling by practitioner or physician  _x__  Patient education by RN  _N/A__  Consent form signed  _N/A__  Rho-Gam given by RN if indicated  _x__ Medication dispensed   _x__   Cytotec 800 mcg  _x_   Intravaginally by patient at  home         __   Intravaginally by RN in MAU        __   Rectally by patient at home        __   Rectally by RN in MAU  _x__  Ibuprofen 600 mg 1 tablet by mouth every 6 hours as needed #30  __x_  Hydrocodone/acetaminophen 5/325 mg by mouth every 4 to 6 hours as needed  _x__  Phenergan 12.5 mg by mouth every 4 hours as needed for nausea   Assessment and Plan  A: Incomplete abortion  P: Discharge home in stable condition Rx for Cytotec, Percocet, Ibuprofen and phenergan given to patient  Bleeding precautions discussed Patient advised to follow-up with [facility] in 1 week for labs and 2 weeks with a provider. [facility] will [facility] with an appointment.  Patient may return to MAU as needed or if her condition were to change or worsen   Vonzella NippleJulie Breckyn Ticas, PA-C 12/20/2016, 1:20 PM

## 2016-12-23 ENCOUNTER — Telehealth: Payer: Self-pay

## 2016-12-23 DIAGNOSIS — O039 Complete or unspecified spontaneous abortion without complication: Secondary | ICD-10-CM

## 2016-12-23 MED ORDER — MISOPROSTOL 200 MCG PO TABS: 200.0000 ug | ORAL_TABLET | Freq: Four times a day (QID) | ORAL | 0 refills | Status: DC

## 2016-12-23 NOTE — Telephone Encounter (Signed)
-----   Message from Marti SleighSctaria J Malloy, [place] sent at 12/22/2016  3:48 PM EDT ----- Regarding: Medication Patient stated that she was given Cytotec d/t SAB.  She stated that she inserted it vaginal like she was told, but both tablets came out.  She wants to know if she can get something else prescribed.  I told her that someone will contact her on 12/23/16.   Thanks ladies

## 2016-12-23 NOTE — Telephone Encounter (Signed)
Patient stated that she was given Cytotec d/t SAB. She stated that she inserted it vaginal like she was told, but both tablets came out. She wants to know if she can get something else prescribed. I told her that someone will contact her on 12/23/16. Called patient on 7/26. Patient stated that two of her Cytotec tablets fell out of her vagina but she was able to dissolve the other two. Per Dr. Marice Potterove the patient needs two more tablets. I informed the patient about her new prescription. Medication was sent to pharmacy. Patient verbalized understanding.

## 2016-12-27 ENCOUNTER — Other Ambulatory Visit: Payer: PRIVATE HEALTH INSURANCE

## 2016-12-27 DIAGNOSIS — O039 Complete or unspecified spontaneous abortion without complication: Secondary | ICD-10-CM

## 2016-12-28 LAB — BETA HCG QUANT (REF LAB): hCG Quant: 1 m[IU]/mL

## 2017-01-12 ENCOUNTER — Encounter: Payer: Self-pay | Admitting: Certified Nurse Midwife

## 2017-01-12 ENCOUNTER — Ambulatory Visit (INDEPENDENT_AMBULATORY_CARE_PROVIDER_SITE_OTHER): Payer: PRIVATE HEALTH INSURANCE | Admitting: Certified Nurse Midwife

## 2017-01-12 VITALS — BP 118/72 | HR 84 | Ht 65.0 in | Wt 239.6 lb

## 2017-01-12 DIAGNOSIS — Z8759 Personal history of other complications of pregnancy, childbirth and the puerperium: Secondary | ICD-10-CM

## 2017-01-12 DIAGNOSIS — Z6790 Unspecified blood type, Rh positive: Secondary | ICD-10-CM

## 2017-01-12 DIAGNOSIS — Z3009 Encounter for other general counseling and advice on contraception: Secondary | ICD-10-CM | POA: Diagnosis not present

## 2017-01-12 MED ORDER — LEVONORGESTREL-ETHINYL ESTRAD 0.1-20 MG-MCG PO TABS: 1.0000 | ORAL_TABLET | Freq: Every day | ORAL | 11 refills | Status: DC

## 2017-01-12 NOTE — Progress Notes (Signed)
States took cytotec, but never bled anymore. States stopped bleeding the afternoon was told she had had a miscarriage. Took both vaginal and oral cytotec.

## 2017-01-12 NOTE — Progress Notes (Signed)
Subjective:     Julie Cardenas is a 31 y.o. female here for follow up following SAB. Was seen in MAU on 12/20/16 after 1 week of VB and US showed no IUP (previous US showed 6367w5d IUP w/o cardiac activity). She was given Cytotec for ? retained POCs, her bleeding stopped shortly thereafter. Quant HCG was negative on 12/27/16. No pain. Has not had IC. Desires OCPs for contraception. Current complaints: none.   Gynecologic History Patient's last menstrual period was 10/27/2016 (approximate). Contraception: none Last Pap: 2 years ago (not on file). Results were: normal Last mammogram: n/a.   Obstetric History OB History  Gravida Para Term Preterm AB Living  1            SAB TAB Ectopic Multiple Live Births               # Outcome Date GA Lbr Len/2nd Weight Sex Delivery Anes PTL Lv  1 Current              The following portions of the patient's history were reviewed and updated as appropriate: allergies, current medications, past family history, past medical history, past social history, past surgical history and problem list.  Review of Systems Gastrointestinal: negative for abdominal pain Genitourinary:negative for vaginal discharge and VB    Objective:    BP 118/72   Pulse 84   Ht 5\' 5"  (1.651 m)   Wt 239 lb 9.6 oz (108.7 kg)   LMP 10/27/2016 (Approximate)   BMI 39.87 kg/m  General appearance: alert, cooperative and no distress Head: Normocephalic, without obvious abnormality, atraumatic Lungs: normal rate and effort Heart: nml rate    Assessment:  S/p SAB Rh positive Contraceptive counseling- discussed all methods including SE, pt requests OCPs   Plan:    Contraception: OCP (estrogen/progesterone). Follow up in: 1 month. for BP check (new start OCP)    Rx Alesse- discussed warning signs  Total encounter 20 min with >50% spent counseling

## 2017-01-18 ENCOUNTER — Telehealth: Payer: Self-pay | Admitting: General Practice

## 2017-01-18 NOTE — Telephone Encounter (Signed)
Called and notified patient to schedule appointment for BP check in 1 month.  Patient is scheduled on 02/09/17 at 8:45am.  Patient voiced understanding.

## 2017-02-09 ENCOUNTER — Ambulatory Visit: Payer: PRIVATE HEALTH INSURANCE

## 2017-02-14 ENCOUNTER — Encounter (HOSPITAL_COMMUNITY): Payer: Self-pay | Admitting: Emergency Medicine

## 2017-02-14 ENCOUNTER — Emergency Department (HOSPITAL_COMMUNITY): Payer: PRIVATE HEALTH INSURANCE

## 2017-02-14 DIAGNOSIS — R079 Chest pain, unspecified: Secondary | ICD-10-CM | POA: Diagnosis present

## 2017-02-14 DIAGNOSIS — J45909 Unspecified asthma, uncomplicated: Secondary | ICD-10-CM | POA: Insufficient documentation

## 2017-02-14 DIAGNOSIS — R0789 Other chest pain: Secondary | ICD-10-CM | POA: Insufficient documentation

## 2017-02-14 DIAGNOSIS — Z79899 Other long term (current) drug therapy: Secondary | ICD-10-CM | POA: Diagnosis not present

## 2017-02-14 NOTE — ED Triage Notes (Signed)
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

## 2017-02-14 NOTE — ED Notes (Signed)
I attempted to collect labs however I was unsuccessful.  I made the nurse aware. 

## 2017-02-15 ENCOUNTER — Emergency Department (HOSPITAL_COMMUNITY)
Admission: EM | Admit: 2017-02-15 | Discharge: 2017-02-15 | Disposition: A | Discharge status: Home / Self Care | Payer: PRIVATE HEALTH INSURANCE | Attending: Emergency Medicine | Admitting: Emergency Medicine

## 2017-02-15 DIAGNOSIS — R079 Chest pain, unspecified: Secondary | ICD-10-CM

## 2017-02-15 LAB — BASIC METABOLIC PANEL
ANION GAP: 8 (ref 5–15)
BUN: 10 mg/dL (ref 6–20)
CO2: 24 mmol/L (ref 22–32)
Calcium: 9.2 mg/dL (ref 8.9–10.3)
Chloride: 107 mmol/L (ref 101–111)
Creatinine, Ser: 0.83 mg/dL (ref 0.44–1.00)
GFR calc Af Amer: >60 mL/min (ref >60)
GFR calc non Af Amer: >60 mL/min (ref >60)
GLUCOSE: 94 mg/dL (ref 65–99)
POTASSIUM: 3.8 mmol/L (ref 3.5–5.1)
Sodium: 139 mmol/L (ref 135–145)

## 2017-02-15 LAB — CBC
HEMATOCRIT: 37.8 % (ref 36.0–46.0)
HEMOGLOBIN: 12.7 g/dL (ref 12.0–15.0)
MCH: 27.5 pg (ref 26.0–34.0)
MCHC: 33.6 g/dL (ref 30.0–36.0)
MCV: 81.8 fL (ref 78.0–100.0)
Platelets: 304 10*3/uL (ref 150–400)
RBC: 4.62 MIL/uL (ref 3.87–5.11)
RDW: 14.2 % (ref 11.5–15.5)
WBC: 7.4 10*3/uL (ref 4.0–10.5)

## 2017-02-15 LAB — D-DIMER, QUANTITATIVE: D-Dimer, Quant: 0.47 ug/mL-FEU (ref 0.00–0.50)

## 2017-02-15 LAB — POCT I-STAT TROPONIN I: Troponin i, poc: 0 ng/mL (ref 0.00–0.08)

## 2017-02-15 MED ORDER — GI COCKTAIL ~~LOC~~
30.0000 mL | Freq: Once | ORAL | Status: AC
  Administered 2017-02-15: 30 mL via ORAL
  Filled 2017-02-15: qty 30

## 2017-02-15 MED ORDER — OMEPRAZOLE 20 MG PO CPDR: 20.0000 mg | DELAYED_RELEASE_CAPSULE | Freq: Every day | ORAL | 0 refills | Status: DC

## 2017-02-15 NOTE — ED Notes (Signed)
ED Provider at bedside. 

## 2017-02-15 NOTE — ED Provider Notes (Signed)
[facility]-EMERGENCY DEPT Provider Note   CSN: [id number] Arrival date & time: 02/14/17  2019     History   Chief Complaint Chief Complaint  Patient presents with  . Chest Pain    HPI Julie Cardenas is a 31 y.o. female.  Patient presents to the ED with a chief complaint of chest pain.  Patient reports sharp stabbing central chest pain that began earlier today. She states that the pain has been constant.  She reports having some SOB and lightheadedness earlier today.  She states that she recently started taking birth control.  She denies any history of ACS, PE, or DVT.  Denies any recent long travel or surgeries.  She has not taken anything for her symptoms.  There are no modifying factors.  She denies any injuries.  There are no other associated symptoms.   The history is provided by the patient. No language interpreter was used.    Past Medical History:  Diagnosis Date  . Anxiety    no medicine, prayers aid her   . Asthma   . Bronchitis   . Depression   . Family history of anesthesia complication    mother slow waking up  . Headache(784.0)    sinus related  . Leg cramps    PCP- advised her to exercise   . Seasonal allergies   . Shortness of breath     Patient Active Problem List   Diagnosis Date Noted  . Major depressive disorder, recurrent episode, moderate (HCC) 12/05/2015  . MDD (major depressive disorder), recurrent severe, 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/2015  . Major depressive disorder, recurrent episode, moderate degree (HCC) 12/05/2015  . Depression 10/04/2014  . Generalized anxiety disorder 10/04/2014  . Nasal polyps 09/13/2013  . Sinusitis, chronic 09/13/2013    Past Surgical History:  Procedure Laterality Date  . SINUS ENDO W/FUSION Right 09/13/2013   Procedure: RIGHT ENDOSCOPIC SINUS SURGERY WITH FUSION SCAN;  Surgeon: Osborn Coho, MD;  Location: [facility] OR;  Service: ENT;  Laterality: Right;  . SINUS ENDO W/FUSION Left 02/01/2014   Procedure: LEFT ENDOSCOPIC  SINUS SURGERY/NASAL POLYPECTOMY WITH FUSION;  Surgeon: Osborn Coho, MD;  Location: [facility] OR;  Service: ENT;  Laterality: Left;  . sinus polyps removed      OB History    Gravida Para Term Preterm AB Living   1             SAB TAB Ectopic Multiple Live Births                   Home Medications    Prior to Admission medications   Medication Sig Start Date End Date Taking? Authorizing Provider  albuterol (PROVENTIL HFA;VENTOLIN HFA) 108 (90 Base) MCG/ACT inhaler Inhale 2 puffs into the lungs every 4 (four) hours as needed for wheezing or shortness of breath. 09/24/16   Graciella Freer A, PA-C  fluticasone furoate-vilanterol (BREO ELLIPTA) 100-25 MCG/INH AEPB Inhale 1 puff into the lungs daily. 09/24/16   Maxwell Caul, PA-C  ibuprofen (ADVIL,MOTRIN) 600 MG tablet Take 1 tablet (600 mg total) by mouth every 6 (six) hours as needed. 12/20/16   Marny Lowenstein, PA-C  levonorgestrel-ethinyl estradiol (AVIANE,ALESSE,LESSINA) 0.1-20 MG-MCG tablet Take 1 tablet by mouth daily. 01/12/17   Donette Larry, CNM    Family History Family History  Problem Relation Age of Onset  . Diabetes Mother   . Cancer Mother   . Hypertension Mother     Social History Social History  Substance Use Topics  .  Smoking status: Never Smoker  . Smokeless tobacco: Never Used  . Alcohol use Yes     Comment: occas.     Allergies   Naproxen   Review of Systems Review of Systems  All other systems reviewed and are negative.    Physical Exam Updated Vital Signs BP 133/66 (BP Location: Left Arm)   Pulse 81   Temp 97.8 F (36.6 C) (Oral)   Resp 14   LMP 10/27/2016 (Approximate)   SpO2 100%   Breastfeeding? Unknown Comment: month ago miscarriage. New birth control  Physical Exam  Constitutional: She is oriented to person, place, and time. She appears well-developed and well-nourished.  HENT:  Head: Normocephalic and atraumatic.  Eyes: Pupils are equal, round, and reactive to light.  Conjunctivae and EOM are normal.  Neck: Normal range of motion. Neck supple.  Cardiovascular: Normal rate and regular rhythm.  Exam reveals no gallop and no friction rub.   No murmur heard. Pulmonary/Chest: Effort normal and breath sounds normal. No respiratory distress. She has no wheezes. She has no rales. She exhibits no tenderness.  Abdominal: Soft. Bowel sounds are normal. She exhibits no distension and no mass. There is no tenderness. There is no rebound and no guarding.  Musculoskeletal: Normal range of motion. She exhibits no edema or tenderness.  Neurological: She is alert and oriented to person, place, and time.  Skin: Skin is warm and dry.  Psychiatric: She has a normal mood and affect. Her behavior is normal. Judgment and thought content normal.  Nursing note and vitals reviewed.    ED Treatments / Results  Labs (all labs ordered are listed, but only abnormal results are displayed) Labs Reviewed  BASIC METABOLIC PANEL  CBC  D-DIMER, QUANTITATIVE (NOT AT [facility])  I-STAT TROPONIN, ED    EKG  EKG Interpretation  Date/Time:  Monday February 14 2017 20:56:05 EDT Ventricular Rate:  81 PR Interval:    QRS Duration: 77 QT Interval:  350 QTC Calculation: 407 R Axis:   62 Text Interpretation:  Sinus rhythm Normal ECG No significant change was found Confirmed by Paula Libra ([id number]) on 02/15/2017 12:15:47 AM       Radiology Dg Chest 2 View  Result Date: 02/14/2017 CLINICAL DATA:  Chest pain EXAM: CHEST  2 VIEW COMPARISON:  09/24/2016 FINDINGS: The heart size and mediastinal contours are within normal limits. Both lungs are clear. The visualized skeletal structures are unremarkable. IMPRESSION: No active cardiopulmonary disease. Electronically Signed   By: Jasmine Pang M.D.   On: 02/14/2017 22:01    Procedures Procedures (including critical care time)  Medications Ordered in ED Medications - No data to display   Initial Impression / Assessment and Plan / ED Course    I have reviewed the triage vital signs and the nursing notes.  Pertinent labs & imaging results that were available during my care of the patient were reviewed by me and considered in my medical decision making (see chart for details).     Patient with central stabbing chest pain.  Had some associated SOB and lightheadedness earlier today.  Just started on birth control about a month ago.  No hx of ACS, PE, or DVT.  Low risk for ACS and PE, but will check d-dimer given symptoms.  Doubt pericarditis, no EKG changes, not positional.  Doubt ACS given age and risk factors.  Not reproducible, doubt MSK.  Will try GI cocktail.  Patient had resolution of her symptoms with GI cocktail. Laboratory workup was unremarkable. D-dimer  is normal. Will discharge to home with omeprazole.  Final Clinical Impressions(s) / ED Diagnoses   Final diagnoses:  Nonspecific chest pain    New Prescriptions New Prescriptions   OMEPRAZOLE (PRILOSEC) 20 MG CAPSULE    Take 1 capsule (20 mg total) by mouth daily.     Roxy Horseman, PA-C 02/15/17 0223    Paula Libra, MD 02/15/17 [phone]

## 2018-10-11 IMAGING — US US OB TRANSVAGINAL
1 series · 15 of 28 positions shown · non-contrast
Comparison: Ultrasound December 12, 2016.

CLINICAL DATA: Vaginal bleeding in first trimester of pregnancy.

EXAM:
TRANSVAGINAL OB ULTRASOUND
TECHNIQUE: Transvaginal ultrasound was performed for complete evaluation of the
gestation as well as the maternal uterus, adnexal regions, and
pelvic cul-de-sac.

[Series 1: us ob transvaginal · 41 acquisitions, 15 frames shown]
[im 1/41]
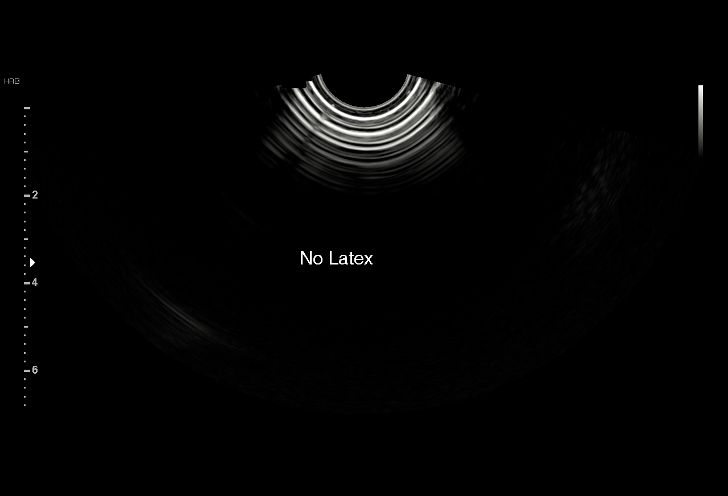
[im 3/41]
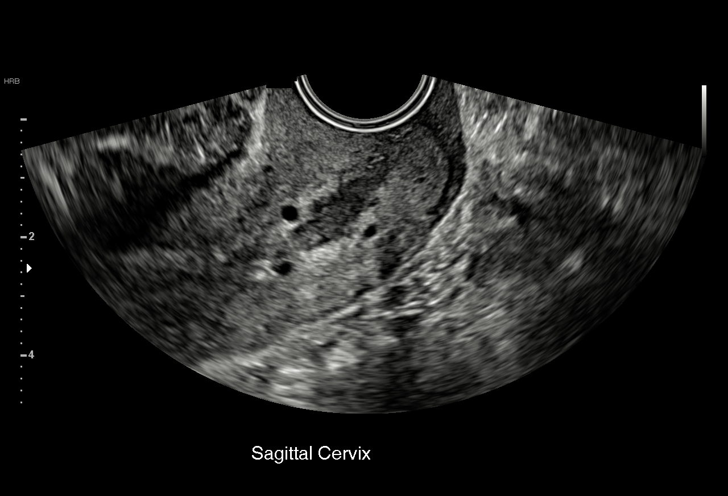
[im 6/41]
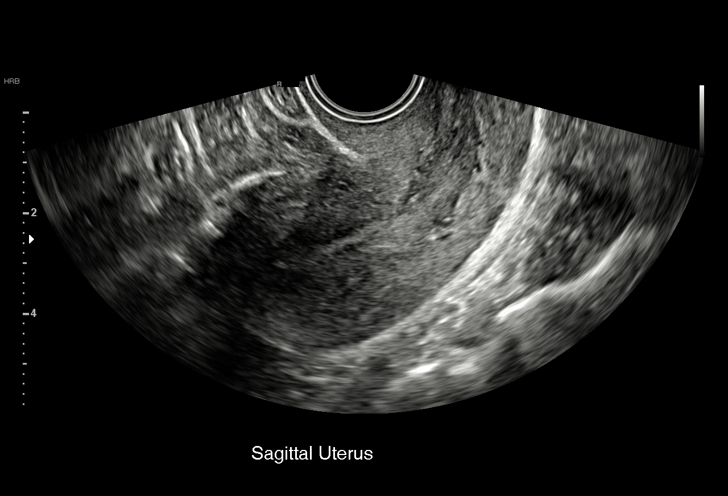
[im 9/41]
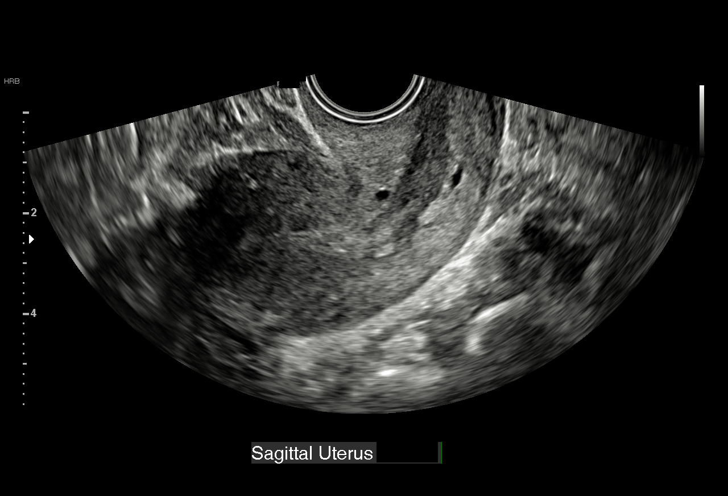
[im 12/41]
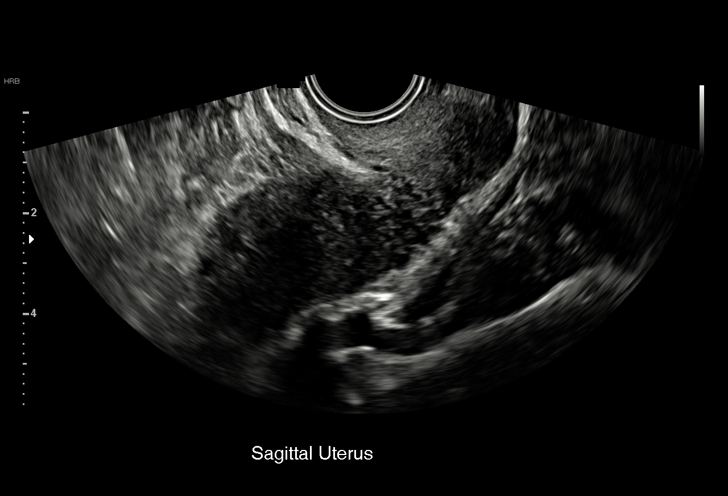
[im 15/41]
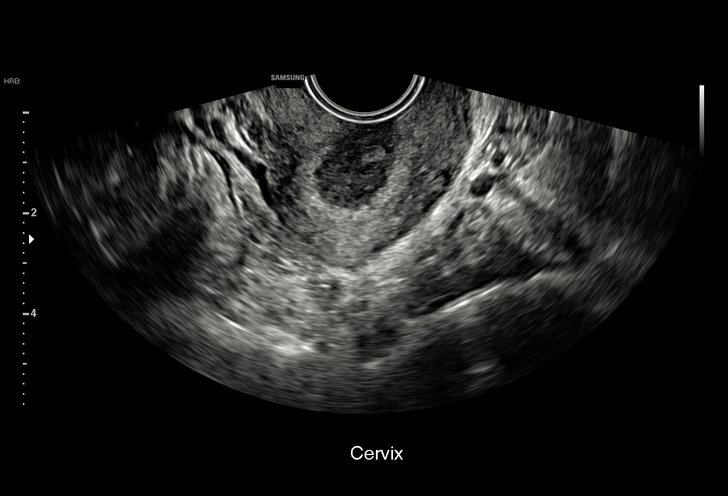
[im 18/41]
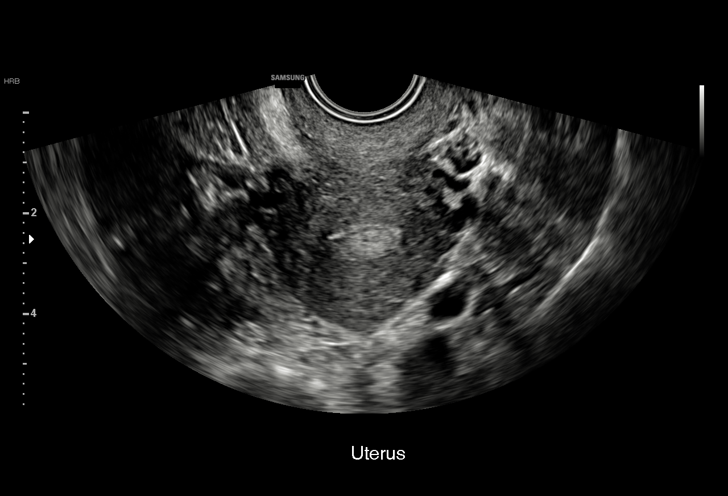
[im 21/41]
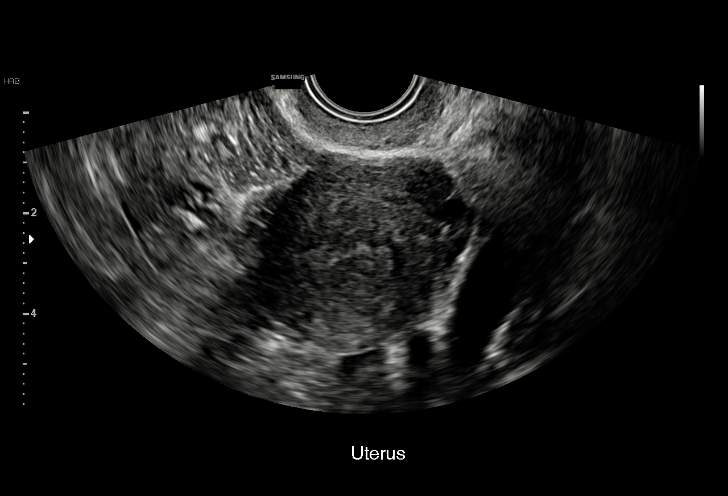
[im 23/41]
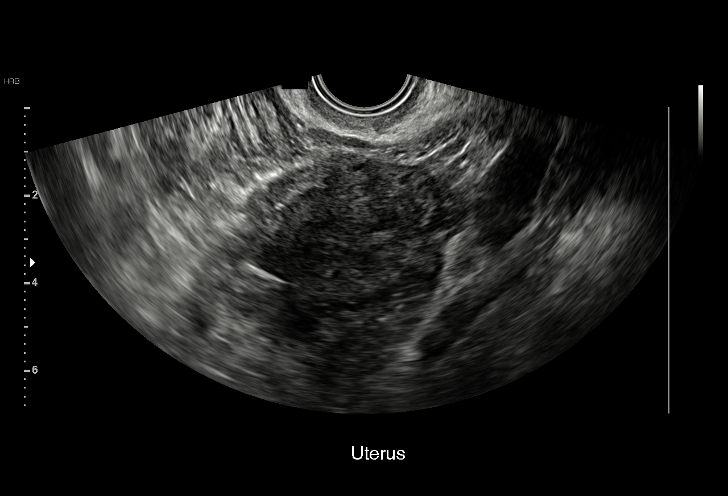
[im 26/41]
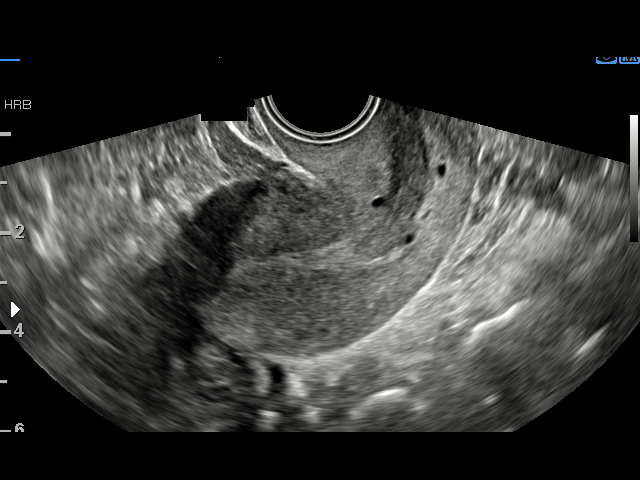
[im 29/41]
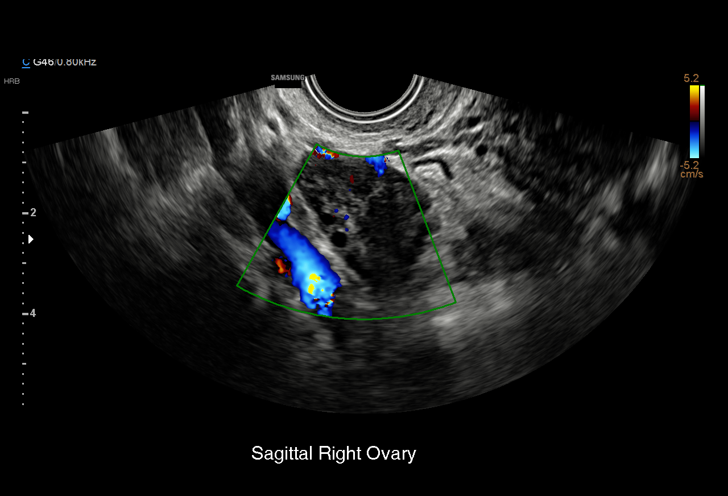
[im 32/41]
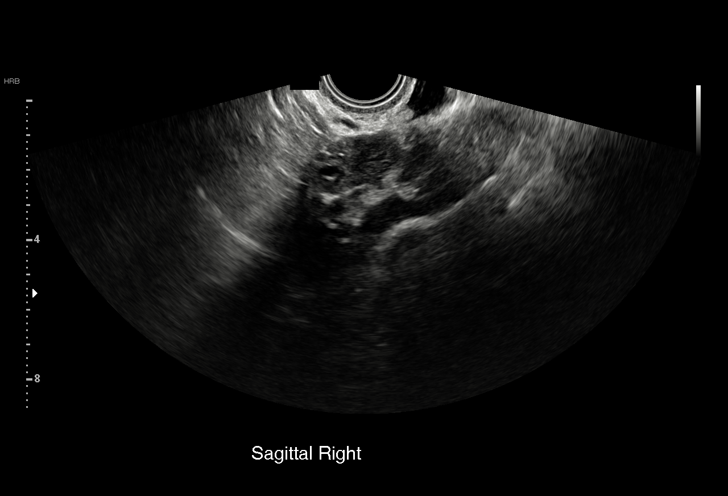
[im 35/41]
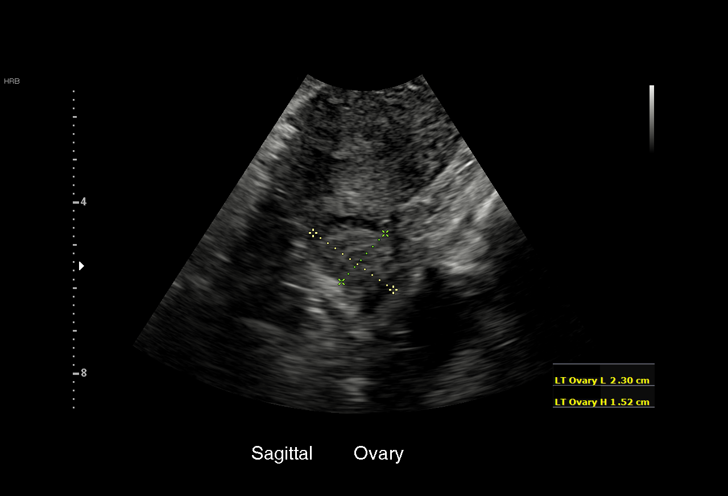
[im 38/41]
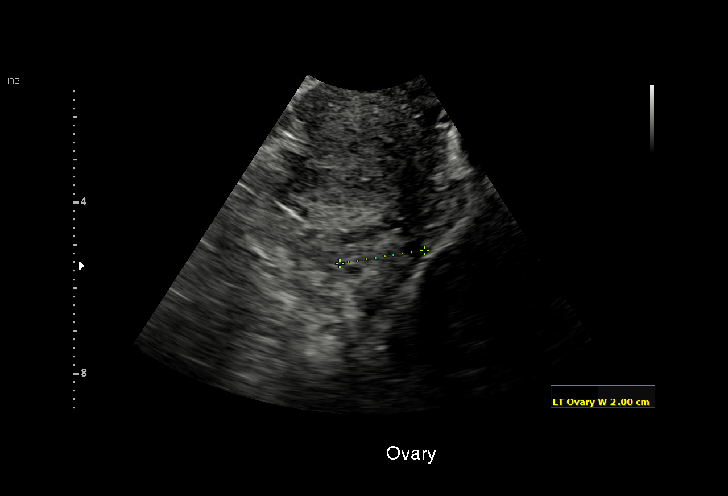
[im 41/41]
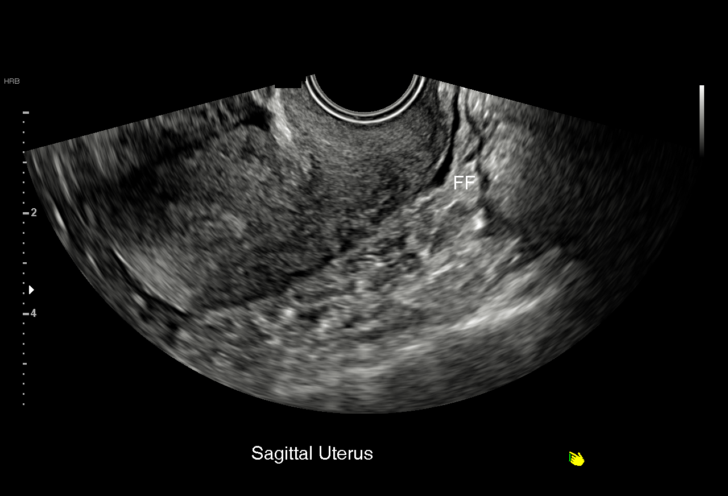

[15 of 28 positions shown; findings below may reference images not displayed]

FINDINGS: Intrauterine gestational sac: Not visualized.

Yolk sac:  Not visualized.

Embryo:  Not visualized.

Cardiac Activity: Not visualized.

Subchorionic hemorrhage:  None visualized.

Maternal uterus/adnexae: Ovaries appear normal. Trace free fluid is
noted. Hypoechoic material is noted within the cervical canal.
IMPRESSION: Gestational sac and embryo noted on prior exam is no longer
visualized, and hypoechoic material is noted in the cervical canal.
This is consistent with incomplete abortion. Retained products of
conception cannot be excluded.

## 2018-11-03 ENCOUNTER — Other Ambulatory Visit: Payer: Self-pay

## 2018-11-03 ENCOUNTER — Encounter (HOSPITAL_COMMUNITY): Payer: Self-pay

## 2018-11-03 ENCOUNTER — Telehealth (HOSPITAL_COMMUNITY): Payer: Self-pay | Admitting: *Deleted

## 2018-11-03 ENCOUNTER — Ambulatory Visit (HOSPITAL_COMMUNITY)
Admission: EM | Admit: 2018-11-03 | Discharge: 2018-11-03 | Disposition: A | Discharge status: Home / Self Care | Payer: BC Managed Care – PPO | Attending: Emergency Medicine | Admitting: Emergency Medicine

## 2018-11-03 DIAGNOSIS — J4521 Mild intermittent asthma with (acute) exacerbation: Secondary | ICD-10-CM

## 2018-11-03 MED ORDER — PREDNISONE 20 MG PO TABS: 20.0000 mg | ORAL_TABLET | Freq: Every day | ORAL | 0 refills | Status: DC

## 2018-11-03 MED ORDER — METHYLPREDNISOLONE SODIUM SUCC 125 MG IJ SOLR
INTRAMUSCULAR | Status: AC
  Filled 2018-11-03: qty 2

## 2018-11-03 MED ORDER — METHYLPREDNISOLONE SODIUM SUCC 125 MG IJ SOLR
80.0000 mg | Freq: Once | INTRAMUSCULAR | Status: AC
Start: 2018-11-03 — End: 2018-11-03
  Administered 2018-11-03: 80 mg via INTRAMUSCULAR

## 2018-11-03 NOTE — Discharge Instructions (Signed)
Take prednisone daily. Use inhaler as instructed. Return if breathing worsens, you develop chest pain, lightheadedness, productive cough, or fever.

## 2018-11-03 NOTE — ED Provider Notes (Signed)
[facility]    CSN: [id number] Arrival date & time: 11/03/18  4098     History   Chief Complaint Chief Complaint  Patient presents with  . Asthma    HPI GAELLE ADRIANCE is a 33 y.o. female with history of mild intermittent asthma presenting for concern of exacerbation.  Patient states that she has had increased shortness of breath with some wheezing since this morning.  Patient took albuterol inhaler at home with mild relief of symptoms.  Last known exacerbation greater than 6 months ago.  Patient not currently taking her Brio inhaler second to cost.  Patient states that she has taken prednisone for this in the past with adequate relief.  Patient denies lightheadedness, headache, chest pain, palpitations, cough, hemoptysis.  Patient no longer on oral contraceptive; denies history of blood clot, lower extremity edema, claudication.    Past Medical History:  Diagnosis Date  . Anxiety    no medicine, prayers aid her   . Asthma   . Bronchitis   . Depression   . Family history of anesthesia complication    mother slow waking up  . Headache(784.0)    sinus related  . Leg cramps    PCP- advised her to exercise   . Seasonal allergies   . Shortness of breath     Patient Active Problem List   Diagnosis Date Noted  . Major depressive disorder, recurrent episode, moderate (HCC) 12/05/2015  . MDD (major depressive disorder), recurrent severe, 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/2015  . Major depressive disorder, recurrent episode, moderate degree (HCC) 12/05/2015  . Depression 10/04/2014  . Generalized anxiety disorder 10/04/2014  . Nasal polyps 09/13/2013  . Sinusitis, chronic 09/13/2013    Past Surgical History:  Procedure Laterality Date  . SINUS ENDO W/FUSION Right 09/13/2013   Procedure: RIGHT ENDOSCOPIC SINUS SURGERY WITH FUSION SCAN;  Surgeon: Osborn Coho, MD;  Location: [facility] OR;  Service: ENT;  Laterality: Right;  . SINUS ENDO W/FUSION Left 02/01/2014   Procedure:  LEFT ENDOSCOPIC SINUS SURGERY/NASAL POLYPECTOMY WITH FUSION;  Surgeon: Osborn Coho, MD;  Location: [facility] OR;  Service: ENT;  Laterality: Left;  . sinus polyps removed      OB History    Gravida  1   Para      Term      Preterm      AB      Living        SAB      TAB      Ectopic      Multiple      Live Births               Home Medications    Prior to Admission medications   Medication Sig Start Date End Date Taking? Authorizing Provider  albuterol (PROVENTIL HFA;VENTOLIN HFA) 108 (90 Base) MCG/ACT inhaler Inhale 2 puffs into the lungs every 4 (four) hours as needed for wheezing or shortness of breath. 09/24/16   Graciella Freer A, PA-C  fluticasone furoate-vilanterol (BREO ELLIPTA) 100-25 MCG/INH AEPB Inhale 1 puff into the lungs daily. Patient not taking: Reported on 02/15/2017 09/24/16   Graciella Freer A, PA-C  ibuprofen (ADVIL,MOTRIN) 600 MG tablet Take 1 tablet (600 mg total) by mouth every 6 (six) hours as needed. Patient not taking: Reported on 02/15/2017 12/20/16   Marny Lowenstein, PA-C  levonorgestrel-ethinyl estradiol (AVIANE,ALESSE,LESSINA) 0.1-20 MG-MCG tablet Take 1 tablet by mouth daily. 01/12/17   Donette Larry, CNM  omeprazole (PRILOSEC) 20 MG capsule Take  1 capsule (20 mg total) by mouth daily. 02/15/17   Roxy HorsemanBrowning, Robert, PA-C  predniSONE (DELTASONE) 20 MG tablet Take 1 tablet (20 mg total) by mouth daily. 11/03/18   Hall-Potvin, [place], PA-C    Family History Family History  Problem Relation Age of Onset  . Diabetes Mother   . Cancer Mother   . Hypertension Mother     Social History Social History   Tobacco Use  . Smoking status: Never Smoker  . Smokeless tobacco: Never Used  Substance Use Topics  . Alcohol use: Yes    Comment: occas.  . Drug use: No     Allergies   Naproxen   Review of Systems As per HPI   Physical Exam Triage Vital Signs ED Triage Vitals  Enc Vitals Group     BP 11/03/18 0854 116/70     Pulse Rate  11/03/18 0854 (!) 104     Resp 11/03/18 0854 18     Temp 11/03/18 0854 98.3 F (36.8 C)     Temp Source 11/03/18 0854 Oral     SpO2 11/03/18 0854 100 %     Weight --      Height --      Head Circumference --      Peak Flow --      Pain Score 11/03/18 0857 7     Pain Loc --      Pain Edu? --      Excl. in GC? --    No data found.  Updated Vital Signs BP 116/70 (BP Location: Left Arm)   Pulse (!) 104   Temp 98.3 F (36.8 C) (Oral)   Resp 18   LMP 10/11/2018   SpO2 100%   Visual Acuity Right Eye Distance:   Left Eye Distance:   Bilateral Distance:    Right Eye Near:   Left Eye Near:    Bilateral Near:     Physical Exam Constitutional:      General: She is not in acute distress. HENT:     Head: Normocephalic and atraumatic.  Eyes:     General: No scleral icterus.    Pupils: Pupils are equal, round, and reactive to light.  Cardiovascular:     Rate and Rhythm: Normal rate and regular rhythm.     Heart sounds: No murmur.     Comments: Manual heart rate 98 by examiner. Pulmonary:     Effort: Pulmonary effort is normal. No respiratory distress.     Comments: Decreased air movement bilaterally, no prolonged expiratory phase, wheezing. Neurological:     Mental Status: She is alert and oriented to person, place, and time.      UC Treatments / Results  Labs (all labs ordered are listed, but only abnormal results are displayed) Labs Reviewed - No data to display  EKG None  Radiology No results found.  Procedures Procedures (including critical care time)  Medications Ordered in UC Medications  methylPREDNISolone sodium succinate (SOLU-MEDROL) 125 mg/2 mL injection 80 mg (80 mg Intramuscular Given 11/03/18 0922)    Initial Impression / Assessment and Plan / UC Course  I have reviewed the triage vital signs and the nursing notes.  Pertinent labs & imaging results that were available during my care of the patient were reviewed by me and considered in my  medical decision making (see chart for details).     33 year old female with history of mild intermittent asthma presenting for asthma exacerbation.  Patient feels symptomatology is similar to the  past.  Patient is stable, not in acute distress at time of visit.  Solu-Medrol injection given in office, 5-day course of oral prednisone, and refill of albuterol inhaler provided.  Return precautions discussed, patient verbalized understanding. Final Clinical Impressions(s) / UC Diagnoses   Final diagnoses:  Mild intermittent asthma with exacerbation     Discharge Instructions     Take prednisone daily. Use inhaler as instructed. Return if breathing worsens, you develop chest pain, lightheadedness, productive cough, or fever.    ED Prescriptions    Medication Sig Dispense Auth. Provider   predniSONE (DELTASONE) 20 MG tablet Take 1 tablet (20 mg total) by mouth daily. 5 tablet Hall-Potvin, [place], PA-C     Controlled Substance Prescriptions [place] Controlled Substance Registry consulted? Not Applicable   Shea Evans, [place] 11/03/18 1122

## 2018-11-03 NOTE — ED Triage Notes (Signed)
Pt presents with recurrent asthma flare up in which patient states she is wheezing, coughing, and chest is hurting and she is not able to adequately rest.

## 2019-02-19 ENCOUNTER — Encounter (HOSPITAL_COMMUNITY): Payer: Self-pay

## 2019-02-19 ENCOUNTER — Emergency Department (HOSPITAL_COMMUNITY): Payer: Self-pay

## 2019-02-19 ENCOUNTER — Emergency Department (HOSPITAL_COMMUNITY)
Admission: EM | Admit: 2019-02-19 | Discharge: 2019-02-19 | Disposition: A | Discharge status: Home / Self Care | Payer: Self-pay | Attending: Emergency Medicine | Admitting: Emergency Medicine

## 2019-02-19 ENCOUNTER — Other Ambulatory Visit: Payer: Self-pay

## 2019-02-19 DIAGNOSIS — J4521 Mild intermittent asthma with (acute) exacerbation: Secondary | ICD-10-CM | POA: Insufficient documentation

## 2019-02-19 DIAGNOSIS — Z79899 Other long term (current) drug therapy: Secondary | ICD-10-CM | POA: Insufficient documentation

## 2019-02-19 LAB — BASIC METABOLIC PANEL
Anion gap: 9 (ref 5–15)
BUN: 9 mg/dL (ref 6–20)
CO2: 23 mmol/L (ref 22–32)
Calcium: 8.9 mg/dL (ref 8.9–10.3)
Chloride: 105 mmol/L (ref 98–111)
Creatinine, Ser: 0.77 mg/dL (ref 0.44–1.00)
GFR calc Af Amer: >60 mL/min (ref >60)
GFR calc non Af Amer: >60 mL/min (ref >60)
Glucose, Bld: 127 mg/dL — ABNORMAL HIGH (ref 70–99)
Potassium: 4 mmol/L (ref 3.5–5.1)
Sodium: 137 mmol/L (ref 135–145)

## 2019-02-19 LAB — CBC
HCT: 42 % (ref 36.0–46.0)
Hemoglobin: 13.2 g/dL (ref 12.0–15.0)
MCH: 27.2 pg (ref 26.0–34.0)
MCHC: 31.4 g/dL (ref 30.0–36.0)
MCV: 86.6 fL (ref 80.0–100.0)
Platelets: 357 10*3/uL (ref 150–400)
RBC: 4.85 MIL/uL (ref 3.87–5.11)
RDW: 13.8 % (ref 11.5–15.5)
WBC: 5.4 10*3/uL (ref 4.0–10.5)
nRBC: 0 % (ref 0.0–0.2)

## 2019-02-19 LAB — TROPONIN I (HIGH SENSITIVITY): Troponin I (High Sensitivity): <2 ng/L (ref <18)

## 2019-02-19 LAB — I-STAT BETA HCG BLOOD, ED (NOT ORDERABLE): I-stat hCG, quantitative: <5 m[IU]/mL (ref <5)

## 2019-02-19 MED ORDER — FLUTICASONE PROPIONATE 50 MCG/ACT NA SUSP: 1.0000 | Freq: Every day | NASAL | 2 refills | Status: DC

## 2019-02-19 MED ORDER — ALBUTEROL SULFATE HFA 108 (90 BASE) MCG/ACT IN AERS: 2.0000 | INHALATION_SPRAY | RESPIRATORY_TRACT | 6 refills | Status: DC | PRN

## 2019-02-19 MED ORDER — PREDNISONE 20 MG PO TABS: 40.0000 mg | ORAL_TABLET | Freq: Every day | ORAL | 0 refills | Status: AC

## 2019-02-19 MED ORDER — MONTELUKAST SODIUM 10 MG PO TABS: 10.0000 mg | ORAL_TABLET | Freq: Every day | ORAL | 1 refills | Status: DC

## 2019-02-19 MED ORDER — SODIUM CHLORIDE 0.9% FLUSH: 3.0000 mL | Freq: Once | INTRAVENOUS | Status: DC

## 2019-02-19 MED ORDER — ALBUTEROL SULFATE (2.5 MG/3ML) 0.083% IN NEBU
5.0000 mg | INHALATION_SOLUTION | Freq: Once | RESPIRATORY_TRACT | Status: AC
  Administered 2019-02-19: 5 mg via RESPIRATORY_TRACT
  Filled 2019-02-19: qty 6

## 2019-02-19 MED ORDER — PREDNISONE 20 MG PO TABS
60.0000 mg | ORAL_TABLET | Freq: Once | ORAL | Status: AC
  Administered 2019-02-19: 60 mg via ORAL
  Filled 2019-02-19: qty 3

## 2019-02-19 MED ORDER — CETIRIZINE HCL 10 MG PO TABS: 10.0000 mg | ORAL_TABLET | Freq: Every day | ORAL | 0 refills | Status: DC

## 2019-02-19 NOTE — ED Notes (Signed)
Pt denies chest pain at this time. Pt states her breathing is better and easier. An After Visit Summary was printed and given to the patient. Discharge instructions given and no further questions at this time

## 2019-02-19 NOTE — Discharge Instructions (Addendum)
Use your inhaler as needed as prescribed. Take Zyrtec every morning. Take Singulair every night. Use Flonase daily. Hopefully with the Zyrtec, Singulair, Flonase, you will only need to use your inhaler on an as needed/rescue basis.  Follow up with [facility] to establish care.

## 2019-02-19 NOTE — ED Triage Notes (Signed)
Pt presents with c/o wheezing and chest pain. Pt reports she has been using her inhaler multiple times a day for the past few weeks. Pt reports that she is experiencing tightness in her chest as well that is intermittent in nature.

## 2019-02-19 NOTE — ED Provider Notes (Signed)
[facility]-EMERGENCY DEPT Provider Note   CSN: [id number] Arrival date & time: 02/19/19  [id number]     History   Chief Complaint Chief Complaint  Patient presents with  . Wheezing  . Chest Pain    HPI Julie Cardenas is a 33 y.o. female.     33 year old female with past medical history of asthma presents with complaint of chest tightness and wheezing, cough.  Patient reports history of asthma, has been using her inhaler more frequently for the past 2 weeks.  Denies fevers, chills, sick contacts.  Patient reports using her last dose of albuterol inhaler on the way into the emergency room today.  Patient was given a neb treatment in triage and symptoms have resolved.  No prior hospitalizations related to her asthma, has never been intubated, previously took inhaled steroid however does not have prescription insurance and this is too expensive at this time.  Patient is not taking any other asthma control medications.  No other complaints or concerns.     Past Medical History:  Diagnosis Date  . Anxiety    no medicine, prayers aid her   . Asthma   . Bronchitis   . Depression   . Family history of anesthesia complication    mother slow waking up  . Headache(784.0)    sinus related  . Leg cramps    PCP- advised her to exercise   . Seasonal allergies   . Shortness of breath     Patient Active Problem List   Diagnosis Date Noted  . Major depressive disorder, recurrent episode, moderate (HCC) 12/05/2015  . MDD (major depressive disorder), recurrent severe, 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/2015  . Major depressive disorder, recurrent episode, moderate degree (HCC) 12/05/2015  . Depression 10/04/2014  . Generalized anxiety disorder 10/04/2014  . Nasal polyps 09/13/2013  . Sinusitis, chronic 09/13/2013    Past Surgical History:  Procedure Laterality Date  . SINUS ENDO W/FUSION Right 09/13/2013   Procedure: RIGHT ENDOSCOPIC SINUS SURGERY WITH FUSION SCAN;   Surgeon: Osborn Cohoavid Shoemaker, MD;  [facility];  Service: ENT;  Laterality: Right;  . SINUS ENDO W/FUSION Left 02/01/2014   Procedure: LEFT ENDOSCOPIC SINUS SURGERY/NASAL POLYPECTOMY WITH FUSION;  Surgeon: Osborn Cohoavid Shoemaker, MD;  [facility];  Service: ENT;  Laterality: Left;  . sinus polyps removed       OB History    Gravida  1   Para      Term      Preterm      AB      Living        SAB      TAB      Ectopic      Multiple      Live Births               Home Medications    Prior to Admission medications   Medication Sig Start Date End Date Taking? Authorizing Provider  albuterol (VENTOLIN HFA) 108 (90 Base) MCG/ACT inhaler Inhale 2 puffs into the lungs every 4 (four) hours as needed for wheezing or shortness of breath. 02/19/19   Jeannie FendMurphy, Kerrion Kemppainen A, PA-C  cetirizine (ZYRTEC ALLERGY) 10 MG tablet Take 1 tablet (10 mg total) by mouth daily. 02/19/19 03/21/19  Jeannie FendMurphy, Kiri Hinderliter A, PA-C  fluticasone (FLONASE) 50 MCG/ACT nasal spray Place 1 spray into both nostrils daily. 02/19/19   Jeannie FendMurphy, Ilina Xu A, PA-C  fluticasone furoate-vilanterol (BREO ELLIPTA) 100-25 MCG/INH AEPB Inhale 1 puff into the lungs daily.  Patient not taking: Reported on 02/15/2017 09/24/16   [place]idence Lanius A, PA-C  ibuprofen (ADVIL,MOTRIN) 600 MG tablet Take 1 tablet (600 mg total) by mouth every 6 (six) hours as needed. Patient not taking: Reported on 02/15/2017 12/20/16   Luvenia Redden, PA-C  levonorgestrel-ethinyl estradiol (AVIANE,ALESSE,LESSINA) 0.1-20 MG-MCG tablet Take 1 tablet by mouth daily. 01/12/17   Julianne Handler, CNM  montelukast (SINGULAIR) 10 MG tablet Take 1 tablet (10 mg total) by mouth at bedtime. 02/19/19 03/21/19  Tacy Learn, PA-C  omeprazole (PRILOSEC) 20 MG capsule Take 1 capsule (20 mg total) by mouth daily. 02/15/17   Montine Circle, PA-C  predniSONE (DELTASONE) 20 MG tablet Take 2 tablets (40 mg total) by mouth daily for 5 days. 02/19/19 02/24/19  Tacy Learn, PA-C    Family  History Family History  Problem Relation Age of Onset  . Diabetes Mother   . Cancer Mother   . Hypertension Mother     Social History Social History   Tobacco Use  . Smoking status: Never Smoker  . Smokeless tobacco: Never Used  Substance Use Topics  . Alcohol use: Yes    Comment: occas.  . Drug use: No     Allergies   Naproxen   Review of Systems Review of Systems  Constitutional: Negative for fever.  Respiratory: Positive for cough, chest tightness, shortness of breath and wheezing.   Cardiovascular: Negative for chest pain.  Musculoskeletal: Negative for arthralgias and myalgias.  Skin: Negative for rash and wound.  Allergic/Immunologic: Negative for immunocompromised state.  Neurological: Negative for dizziness and weakness.  Hematological: Negative for adenopathy.  Psychiatric/Behavioral: Negative for confusion.  All other systems reviewed and are negative.    Physical Exam Updated Vital Signs BP 131/71   Pulse 80   Temp 98.1 F (36.7 C) (Oral)   Resp 18   Ht 5\' 5"  (1.651 m)   Wt 115.7 kg   LMP 01/25/2019 (Approximate)   SpO2 100%   BMI 42.43 kg/m   Physical Exam Vitals signs and nursing note reviewed.  Constitutional:      General: She is not in acute distress.    Appearance: She is well-developed. She is not diaphoretic.  HENT:     Head: Normocephalic and atraumatic.  Cardiovascular:     Rate and Rhythm: Regular rhythm. Tachycardia present.     Heart sounds: Normal heart sounds.     Comments: HR 105 RRR Pulmonary:     Effort: Pulmonary effort is normal.     Breath sounds: Normal breath sounds. No decreased breath sounds or wheezing.  Chest:     Chest wall: No tenderness.  Skin:    General: Skin is warm and dry.  Neurological:     Mental Status: She is alert and oriented to person, place, and time.  Psychiatric:        Behavior: Behavior normal.      ED Treatments / Results  Labs (all labs ordered are listed, but only abnormal  results are displayed) Labs Reviewed  BASIC METABOLIC PANEL - Abnormal; Notable for the following components:      Result Value   Glucose, Bld 127 (*)    All other components within normal limits  CBC  I-STAT BETA HCG BLOOD, ED ([facility], [facility], [facility] ONLY)  I-STAT BETA HCG BLOOD, ED (NOT ORDERABLE)  TROPONIN I (HIGH SENSITIVITY)  TROPONIN I (HIGH SENSITIVITY)    EKG None  Radiology Dg Chest 2 View  Result Date: 02/19/2019 CLINICAL DATA:  Chest  pain. Shortness of breath. Productive cough for 2 weeks. History of asthma. EXAM: CHEST - 2 VIEW COMPARISON:  02/14/2017 FINDINGS: The cardiomediastinal silhouette is within normal limits. The lungs are well inflated and clear. There is no evidence of pleural effusion or pneumothorax. No acute osseous abnormality is identified. IMPRESSION: No active cardiopulmonary disease. Electronically Signed   By: Sebastian Ache M.D.   On: 02/19/2019 11:19    Procedures Procedures (including critical care time)  Medications Ordered in ED Medications  sodium chloride flush (NS) 0.9 % injection 3 mL (has no administration in time range)  albuterol (PROVENTIL) (2.5 MG/3ML) 0.083% nebulizer solution 5 mg (5 mg Nebulization Given 02/19/19 1023)  predniSONE (DELTASONE) tablet 60 mg (60 mg Oral Given 02/19/19 1130)     Initial Impression / Assessment and Plan / ED Course  I have reviewed the triage vital signs and the nursing notes.  Pertinent labs & imaging results that were available during my care of the patient were reviewed by me and considered in my medical decision making (see chart for details).  Clinical Course as of Feb 18 1429  Mon Feb 19, 2019  1874 33 year old female with history of asthma presents with complaint of asthma exacerbation, patient was given neb treatment in triage, symptoms have since completely resolved, no further complaints at this time.  Patient has previously used an inhaled steroid, is unable to afford this treatment at this time and has  been using her albuterol inhaler daily.  Recommend patient start using Zyrtec, Flonase, Singulair for better asthma control.  Patient referred to [facility] to establish care and have better management of her asthma.   [LM]    Clinical Course User Index [LM] Jeannie Fend, PA-C      Final Clinical Impressions(s) / ED Diagnoses   Final diagnoses:  None    ED Discharge Orders         Ordered    albuterol (VENTOLIN HFA) 108 (90 Base) MCG/ACT inhaler  Every 4 hours PRN     02/19/19 1132    predniSONE (DELTASONE) 20 MG tablet  Daily     02/19/19 1132    montelukast (SINGULAIR) 10 MG tablet  Daily at bedtime     02/19/19 1132    cetirizine (ZYRTEC ALLERGY) 10 MG tablet  Daily     02/19/19 1132    fluticasone (FLONASE) 50 MCG/ACT nasal spray  Daily     02/19/19 1132           Jeannie Fend, PA-C 02/19/19 1430    Linwood Dibbles, MD 02/21/19 [phone]

## 2019-05-20 ENCOUNTER — Other Ambulatory Visit: Payer: Self-pay

## 2019-05-20 ENCOUNTER — Ambulatory Visit
Admission: EM | Admit: 2019-05-20 | Discharge: 2019-05-20 | Disposition: A | Discharge status: Home / Self Care | Payer: Self-pay | Attending: Physician Assistant | Admitting: Physician Assistant

## 2019-05-20 DIAGNOSIS — J4531 Mild persistent asthma with (acute) exacerbation: Secondary | ICD-10-CM

## 2019-05-20 MED ORDER — PREDNISONE 50 MG PO TABS: 50.0000 mg | ORAL_TABLET | Freq: Every day | ORAL | 0 refills | Status: DC

## 2019-05-20 MED ORDER — DEXAMETHASONE SODIUM PHOSPHATE 10 MG/ML IJ SOLN
10.0000 mg | Freq: Once | INTRAMUSCULAR | Status: AC
  Administered 2019-05-20: 10 mg via INTRAMUSCULAR

## 2019-05-20 NOTE — Discharge Instructions (Signed)
Decadron injection in office today.  Continue albuterol inhaler as needed.  If symptoms not improving in 1 to 2 days, can start prednisone as directed.  As discussed, cannot rule out Covid causing symptoms, if noticing cold symptoms such as runny nose, stuffy nose, sore throat, fever, body aches, please go to free driving site for Covid testing.  If worsening symptoms, no significant relief of albuterol inhaler, unable to speak in full sentences, go to the emergency department for further evaluation.

## 2019-05-20 NOTE — ED Triage Notes (Signed)
Pt c/o asthma exacerbation x3 day, SOB, chest tightness and cough. Using inhaler with no relief

## 2019-05-20 NOTE — ED Provider Notes (Signed)
[name]    CSN: [id number] Arrival date & time: 05/20/19  1005      History   Chief Complaint Chief Complaint  Patient presents with  . Asthma    HPI Julie Cardenas is a 33 y.o. female.   33 year old female comes in for 3-day history of asthma exacerbation.  She has had shortness of breath, chest tightness, cough.  Cough only occurs when short of breath.  She denies other URI symptoms such as rhinorrhea, nasal congestion, sore throat.  Denies fever, chills, body aches.  Denies abdominal pain, nausea, vomiting, diarrhea.  Denies loss of taste or smell.  She has been using albuterol inhaler without much relief.  No known sick/Covid contact.  No obvious triggers.  Patient is supposed to be on maintenance inhalers, has not been able to afford, and has not used.  States during wintertime, can have 3-4 exacerbations throughout the few months, usually needing steroids.  Last exacerbation 01/2019.  Otherwise, she can have mild exacerbations every other month with albuterol use.     Past Medical History:  Diagnosis Date  . Anxiety    no medicine, prayers aid her   . Asthma   . Bronchitis   . Depression   . Family history of anesthesia complication    mother slow waking up  . Headache(784.0)    sinus related  . Leg cramps    PCP- advised her to exercise   . Seasonal allergies   . Shortness of breath     Patient Active Problem List   Diagnosis Date Noted  . Major depressive disorder, recurrent episode, moderate (HCC) 12/05/2015  . MDD (major depressive disorder), recurrent severe, 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/2015  . Major depressive disorder, recurrent episode, moderate degree (HCC) 12/05/2015  . Depression 10/04/2014  . Generalized anxiety disorder 10/04/2014  . Nasal polyps 09/13/2013  . Sinusitis, chronic 09/13/2013    Past Surgical History:  Procedure Laterality Date  . SINUS ENDO W/FUSION Right 09/13/2013   Procedure: RIGHT ENDOSCOPIC SINUS SURGERY  WITH FUSION SCAN;  Surgeon: Osborn Cohoavid Shoemaker, MD;  [facility];  Service: ENT;  Laterality: Right;  . SINUS ENDO W/FUSION Left 02/01/2014   Procedure: LEFT ENDOSCOPIC SINUS SURGERY/NASAL POLYPECTOMY WITH FUSION;  Surgeon: Osborn Cohoavid Shoemaker, MD;  [facility];  Service: ENT;  Laterality: Left;  . sinus polyps removed      OB History    Gravida  1   Para      Term      Preterm      AB      Living        SAB      TAB      Ectopic      Multiple      Live Births               Home Medications    Prior to Admission medications   Medication Sig Start Date End Date Taking? Authorizing Provider  albuterol (VENTOLIN HFA) 108 (90 Base) MCG/ACT inhaler Inhale 2 puffs into the lungs every 4 (four) hours as needed for wheezing or shortness of breath. 02/19/19   Jeannie FendMurphy, Laura A, PA-C  cetirizine (ZYRTEC ALLERGY) 10 MG tablet Take 1 tablet (10 mg total) by mouth daily. 02/19/19 03/21/19  Jeannie FendMurphy, Laura A, PA-C  fluticasone (FLONASE) 50 MCG/ACT nasal spray Place 1 spray into both nostrils daily. 02/19/19   Jeannie FendMurphy, Laura A, PA-C  fluticasone furoate-vilanterol (BREO ELLIPTA) 100-25 MCG/INH AEPB Inhale 1 puff  into the lungs daily. Patient not taking: Reported on 02/15/2017 09/24/16   Graciella Freer A, PA-C  ibuprofen (ADVIL,MOTRIN) 600 MG tablet Take 1 tablet (600 mg total) by mouth every 6 (six) hours as needed. Patient not taking: Reported on 02/15/2017 12/20/16   Marny Lowenstein, PA-C  montelukast (SINGULAIR) 10 MG tablet Take 1 tablet (10 mg total) by mouth at bedtime. 02/19/19 03/21/19  Jeannie Fend, PA-C  predniSONE (DELTASONE) 50 MG tablet Take 1 tablet (50 mg total) by mouth daily with breakfast. 05/20/19   Belinda Fisher, PA-C    Family History Family History  Problem Relation Age of Onset  . Diabetes Mother   . Cancer Mother   . Hypertension Mother     Social History Social History   Tobacco Use  . Smoking status: Never Smoker  . Smokeless tobacco: Never Used    Substance Use Topics  . Alcohol use: Yes    Comment: occas.  . Drug use: No     Allergies   Naproxen   Review of Systems Review of Systems  Reason unable to perform ROS: See HPI as above.     Physical Exam Triage Vital Signs ED Triage Vitals  Enc Vitals Group     BP 05/20/19 1023 128/84     Pulse Rate 05/20/19 1023 (!) 105     Resp 05/20/19 1023 18     Temp 05/20/19 1023 98.4 F (36.9 C)     Temp Source 05/20/19 1023 Oral     SpO2 05/20/19 1023 98 %     Weight --      Height --      Head Circumference --      Peak Flow --      Pain Score 05/20/19 1026 0     Pain Loc --      Pain Edu? --      Excl. in GC? --    No data found.  Updated Vital Signs BP 128/84 (BP Location: Left Arm)   Pulse (!) 105   Temp 98.4 F (36.9 C) (Oral)   Resp 18   LMP 04/28/2019   SpO2 98%   Physical Exam Constitutional:      General: She is not in acute distress.    Appearance: Normal appearance. She is not ill-appearing, toxic-appearing or diaphoretic.  HENT:     Head: Normocephalic and atraumatic.     Mouth/Throat:     Mouth: Mucous membranes are moist.     Pharynx: Oropharynx is clear. Uvula midline.  Cardiovascular:     Rate and Rhythm: Normal rate and regular rhythm.     Heart sounds: Normal heart sounds. No murmur. No friction rub. No gallop.   Pulmonary:     Effort: No accessory muscle usage, prolonged expiration or retractions.     Comments: Patient had audible wheezing at triage.  Lungs with decreased air movement, no wheezing, rales, rhonchi.  4 puffs albuterol: Lungs with improved air movement, no wheezing, rale, rhonchi.  Patient speaking in full sentences.  No accessory muscle use.  No obvious increased work of breath Musculoskeletal:     Cervical back: Normal range of motion and neck supple.  Neurological:     General: No focal deficit present.     Mental Status: She is alert and oriented to person, place, and time.      UC Treatments / Results   Labs (all labs ordered are listed, but only abnormal results are displayed) Labs Reviewed - No  data to display  EKG   Radiology No results found.  Procedures Procedures (including critical care time)  Medications Ordered in UC Medications  dexamethasone (DECADRON) injection 10 mg (10 mg Intramuscular Given 05/20/19 1139)    Initial Impression / Assessment and Plan / UC Course  I have reviewed the triage vital signs and the nursing notes.  Pertinent labs & imaging results that were available during my care of the patient were reviewed by me and considered in my medical decision making (see chart for details).    Patient still with chest tightness, though improved air movement after albuterol inhaler.  Currently without insurance, will stick to Decadron injection, prednisone at this time.  Will have patient go to drive-through site/free testing for Covid.  If symptoms not completely relieved, can return for nebulizer machine.  Return precautions given.  Patient expresses understanding and agrees to plan.  Final Clinical Impressions(s) / UC Diagnoses   Final diagnoses:  Mild persistent asthma with acute exacerbation   ED Prescriptions    Medication Sig Dispense Auth. Provider   predniSONE (DELTASONE) 50 MG tablet Take 1 tablet (50 mg total) by mouth daily with breakfast. 5 tablet Ok Edwards, PA-C     PDMP not reviewed this encounter.   Ok Edwards, PA-C 05/20/19 1152

## 2019-09-01 ENCOUNTER — Emergency Department (HOSPITAL_COMMUNITY): Payer: Self-pay

## 2019-09-01 ENCOUNTER — Other Ambulatory Visit: Payer: Self-pay

## 2019-09-01 ENCOUNTER — Emergency Department (HOSPITAL_COMMUNITY)
Admission: EM | Admit: 2019-09-01 | Discharge: 2019-09-01 | Disposition: A | Discharge status: Home / Self Care | Payer: Self-pay | Attending: Emergency Medicine | Admitting: Emergency Medicine

## 2019-09-01 DIAGNOSIS — J4531 Mild persistent asthma with (acute) exacerbation: Secondary | ICD-10-CM | POA: Insufficient documentation

## 2019-09-01 MED ORDER — ALBUTEROL SULFATE HFA 108 (90 BASE) MCG/ACT IN AERS: 1.0000 | INHALATION_SPRAY | Freq: Four times a day (QID) | RESPIRATORY_TRACT | 1 refills | Status: DC | PRN

## 2019-09-01 MED ORDER — PREDNISONE 20 MG PO TABS
60.0000 mg | ORAL_TABLET | Freq: Once | ORAL | Status: AC
  Administered 2019-09-01: 60 mg via ORAL
  Filled 2019-09-01: qty 3

## 2019-09-01 MED ORDER — IPRATROPIUM BROMIDE HFA 17 MCG/ACT IN AERS
2.0000 | INHALATION_SPRAY | Freq: Once | RESPIRATORY_TRACT | Status: AC
  Administered 2019-09-01: 2 via RESPIRATORY_TRACT
  Filled 2019-09-01: qty 12.9

## 2019-09-01 MED ORDER — ALBUTEROL SULFATE HFA 108 (90 BASE) MCG/ACT IN AERS
4.0000 | INHALATION_SPRAY | Freq: Once | RESPIRATORY_TRACT | Status: AC
  Administered 2019-09-01: 19:00:00 4 via RESPIRATORY_TRACT
  Filled 2019-09-01: qty 6.7

## 2019-09-01 MED ORDER — PREDNISONE 50 MG PO TABS: 50.0000 mg | ORAL_TABLET | Freq: Every day | ORAL | 0 refills | Status: DC

## 2019-09-01 NOTE — ED Provider Notes (Signed)
[facility]-EMERGENCY DEPT Provider Note   CSN: [id number] Arrival date & time: 09/01/19  1806     History Chief Complaint  Patient presents with  . Shortness of Breath  . Asthma    Julie Cardenas is a 34 y.o. female with PMH significant for asthma and seasonal allergies resents the ED with complaints of asthma exacerbation.  Patient reports that over the course of the past 2 to 3 weeks, she has been experiencing increasing shortness of breath, wheezing, chest tightness, and cough productive of a clearish-yellow mucus.  She states that she has gone through two of her albuterol rescue inhalers during this period of time and she is now out of her medications.  She had been prescribed Breo in the past, but has since lost her medical insurance and has not been taking anything else besides Flonase which has been helpful for her seasonal allergies.  She has not been taking antihistamines.  She states that her chest tightness is reproducible with cough.  She denies any hemoptysis, leg swelling, palpitations, fevers or chills, abdominal pain, nausea or vomiting, urinary symptoms, or changes in bowel habits.  HPI     Past Medical History:  Diagnosis Date  . Anxiety    no medicine, prayers aid her   . Asthma   . Bronchitis   . Depression   . Family history of anesthesia complication    mother slow waking up  . Headache(784.0)    sinus related  . Leg cramps    PCP- advised her to exercise   . Seasonal allergies   . Shortness of breath     Patient Active Problem List   Diagnosis Date Noted  . Major depressive disorder, recurrent episode, moderate (HCC) 12/05/2015  . MDD (major depressive disorder), recurrent severe, 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/2015  . Major depressive disorder, recurrent episode, moderate degree (HCC) 12/05/2015  . Depression 10/04/2014  . Generalized anxiety disorder 10/04/2014  . Nasal polyps 09/13/2013  . Sinusitis, chronic 09/13/2013     Past Surgical History:  Procedure Laterality Date  . SINUS ENDO W/FUSION Right 09/13/2013   Procedure: RIGHT ENDOSCOPIC SINUS SURGERY WITH FUSION SCAN;  Surgeon: Osborn Coho, MD;  Location: [facility] OR;  Service: ENT;  Laterality: Right;  . SINUS ENDO W/FUSION Left 02/01/2014   Procedure: LEFT ENDOSCOPIC SINUS SURGERY/NASAL POLYPECTOMY WITH FUSION;  Surgeon: Osborn Coho, MD;  Location: [facility] OR;  Service: ENT;  Laterality: Left;  . sinus polyps removed       OB History    Gravida  1   Para      Term      Preterm      AB      Living        SAB      TAB      Ectopic      Multiple      Live Births              Family History  Problem Relation Age of Onset  . Diabetes Mother   . Cancer Mother   . Hypertension Mother     Social History   Tobacco Use  . Smoking status: Never Smoker  . Smokeless tobacco: Never Used  Substance Use Topics  . Alcohol use: Yes    Comment: occas.  . Drug use: No    Home Medications Prior to Admission medications   Medication Sig Start Date End Date Taking? Authorizing Provider  albuterol (VENTOLIN HFA) 108 (90  Base) MCG/ACT inhaler Inhale 1-2 puffs into the lungs every 6 (six) hours as needed for wheezing or shortness of breath. 09/01/19   Corena Herter, PA-C  cetirizine (ZYRTEC ALLERGY) 10 MG tablet Take 1 tablet (10 mg total) by mouth daily. 02/19/19 03/21/19  Tacy Learn, PA-C  fluticasone (FLONASE) 50 MCG/ACT nasal spray Place 1 spray into both nostrils daily. 02/19/19   Tacy Learn, PA-C  fluticasone furoate-vilanterol (BREO ELLIPTA) 100-25 MCG/INH AEPB Inhale 1 puff into the lungs daily. Patient not taking: Reported on 02/15/2017 09/24/16   Providence Lanius A, PA-C  ibuprofen (ADVIL,MOTRIN) 600 MG tablet Take 1 tablet (600 mg total) by mouth every 6 (six) hours as needed. Patient not taking: Reported on 02/15/2017 12/20/16   Luvenia Redden, PA-C  montelukast (SINGULAIR) 10 MG tablet Take 1 tablet (10 mg total) by mouth  at bedtime. 02/19/19 03/21/19  Tacy Learn, PA-C  predniSONE (DELTASONE) 50 MG tablet Take 1 tablet (50 mg total) by mouth daily with breakfast. 05/20/19   Tasia Catchings, Amy V, PA-C    Allergies    Naproxen  Review of Systems   Review of Systems  Constitutional: Negative for chills and fever.  HENT: Negative for sore throat and trouble swallowing.   Respiratory: Positive for cough, chest tightness and shortness of breath.   Cardiovascular: Negative for chest pain, palpitations and leg swelling.  Gastrointestinal: Negative for abdominal pain and nausea.    Physical Exam Updated Vital Signs BP (!) 160/98 (BP Location: Right Arm)   Pulse (!) 106   Temp 98 F (36.7 C) (Oral)   Resp 20   Ht 5\' 5"  (1.651 m)   Wt 124.7 kg   LMP 08/23/2019   SpO2 99%   BMI 45.76 kg/m   Physical Exam Vitals and nursing note reviewed. Exam conducted with a chaperone present.  Constitutional:      General: She is not in acute distress.    Appearance: Normal appearance. She is not ill-appearing.  HENT:     Head: Normocephalic and atraumatic.  Eyes:     General: No scleral icterus.    Conjunctiva/sclera: Conjunctivae normal.  Cardiovascular:     Rate and Rhythm: Normal rate and regular rhythm.     Pulses: Normal pulses.     Heart sounds: Normal heart sounds.  Pulmonary:     Comments: No significant increased work of breathing.  Symmetric chest rise.  Breath sounds intact bilaterally.  Mild expiratory wheezing auscultated, no other abnormal breath sounds.  No respiratory distress.  No accessory muscle use. Abdominal:     General: Abdomen is flat.     Palpations: Abdomen is soft.     Tenderness: There is no abdominal tenderness. There is no guarding.  Musculoskeletal:        General: Normal range of motion.     Cervical back: Normal range of motion. No rigidity.     Right lower leg: No edema.     Left lower leg: No edema.  Skin:    General: Skin is dry.     Capillary Refill: Capillary refill takes  less than 2 seconds.  Neurological:     Mental Status: She is alert and oriented to person, place, and time.     GCS: GCS eye subscore is 4. GCS verbal subscore is 5. GCS motor subscore is 6.  Psychiatric:        Mood and Affect: Mood normal.        Behavior: Behavior normal.  Thought Content: Thought content normal.     ED Results / Procedures / Treatments   Labs (all labs ordered are listed, but only abnormal results are displayed) Labs Reviewed - No data to display  EKG None  Radiology DG Chest Portable 1 View  Result Date: 09/01/2019 CLINICAL DATA:  Shortness of breath.  History of asthma. EXAM: PORTABLE CHEST 1 VIEW COMPARISON:  February 19, 2019 FINDINGS: Cardiomediastinal silhouette is normal. Mediastinal contours appear intact. There is no evidence of focal airspace consolidation, pleural effusion or pneumothorax. Osseous structures are without acute abnormality. Soft tissues are grossly normal. IMPRESSION: No active disease. Electronically Signed   By: Ted Mcalpine M.D.   On: 09/01/2019 19:13    Procedures Procedures (including critical care time)  Medications Ordered in ED Medications  predniSONE (DELTASONE) tablet 60 mg (60 mg Oral Given 09/01/19 1919)  albuterol (VENTOLIN HFA) 108 (90 Base) MCG/ACT inhaler 4 puff (4 puffs Inhalation Given 09/01/19 1920)  ipratropium (ATROVENT HFA) inhaler 2 puff (2 puffs Inhalation Given 09/01/19 1920)    ED Course  I have reviewed the triage vital signs and the nursing notes.  Pertinent labs & imaging results that were available during my care of the patient were reviewed by me and considered in my medical decision making (see chart for details).    MDM Rules/Calculators/A&P                      Provided patient with 60 mg prednisone, 4 puffs albuterol, and 2 puffs ipratropium for her suspected asthma exacerbation.  Obtained plain films to ensure that there was no consolidation concerning for pneumonia or evidence of  spontaneous pneumothorax, pleural effusion, or other acute pulmonary findings.  I reviewed plain films obtained which revealed no acute cardiopulmonary findings.  On reexamination, patient is feeling significantly improved.  She reports that she tolerates prednisone well and is happy to not have an albuterol inhaler to take home with her.  She will also take home the ipratropium which I would encourage her to use only for moderate to severe exacerbations.  I will also prescribe her a prednisone burst and encourage her to take it in the morning with breakfast.  Patient will need outpatient follow-up.  She does not have a primary care provider at the time, will refer her to [facility].  COVID-19 testing was also obtained and will encourage her to maintain precautions while results pend.  Strict ED return precautions discussed with the patient.  All of the evaluation and work-up results were discussed with the patient and any family at bedside. They were provided opportunity to ask any additional questions and have none at this time. They have expressed understanding of verbal discharge instructions as well as return precautions and are agreeable to the plan.    Final Clinical Impression(s) / ED Diagnoses Final diagnoses:  Mild persistent asthma with exacerbation    Rx / DC Orders ED Discharge Orders         Ordered    albuterol (VENTOLIN HFA) 108 (90 Base) MCG/ACT inhaler  Every 6 hours PRN     09/01/19 1958           Lorelee New, PA-C 09/01/19 Drucilla Schmidt, MD 09/02/19 [id number]

## 2019-09-01 NOTE — Discharge Instructions (Signed)
Please read the attachment on asthma.  You will need to follow-up with [facility] to get established with a primary care provider.  Please use your albuterol, as needed.  The ipratropium should be used only for moderate to severe asthma exacerbations.  I have also prescribed you a 5-day course of prednisone that I encourage you to take in the morning with breakfast.  Please also continue take the Flonase, as directed.  You would benefit from antihistamines such as Zyrtec or Allegra that you can obtain over-the-counter at a pharmacy.  Return to the ED or seek immediate medical attention should you experience any new or worsening symptoms.

## 2019-09-01 NOTE — ED Triage Notes (Signed)
Per patient, her asthma has been getting bad for a month, but the last week it has gotten worse. Patient states she has used two rescue inhalers in the last week with no relief.

## 2019-11-14 ENCOUNTER — Ambulatory Visit
Admission: EM | Admit: 2019-11-14 | Discharge: 2019-11-14 | Disposition: A | Discharge status: Home / Self Care | Payer: Self-pay | Attending: Physician Assistant | Admitting: Physician Assistant

## 2019-11-14 DIAGNOSIS — J4531 Mild persistent asthma with (acute) exacerbation: Secondary | ICD-10-CM

## 2019-11-14 MED ORDER — IPRATROPIUM-ALBUTEROL 0.5-2.5 (3) MG/3ML IN SOLN: 3.0000 mL | Freq: Four times a day (QID) | RESPIRATORY_TRACT | 0 refills | Status: DC | PRN

## 2019-11-14 MED ORDER — ALBUTEROL SULFATE (2.5 MG/3ML) 0.083% IN NEBU: 2.5000 mg | INHALATION_SOLUTION | Freq: Four times a day (QID) | RESPIRATORY_TRACT | 0 refills | Status: DC | PRN

## 2019-11-14 MED ORDER — PREDNISONE 50 MG PO TABS: 50.0000 mg | ORAL_TABLET | Freq: Every day | ORAL | 0 refills | Status: DC

## 2019-11-14 MED ORDER — ALBUTEROL SULFATE HFA 108 (90 BASE) MCG/ACT IN AERS: 1.0000 | INHALATION_SPRAY | Freq: Four times a day (QID) | RESPIRATORY_TRACT | 1 refills | Status: DC | PRN

## 2019-11-14 NOTE — Discharge Instructions (Signed)
Start prednisone as directed. Duoneb as needed. Albuterol as needed. Follow up with PCP as scheduled for further evaluation and management needed.

## 2019-11-14 NOTE — ED Provider Notes (Signed)
[name]    CSN: [id number] Arrival date & time: 11/14/19  0806      History   Chief Complaint Chief Complaint  Patient presents with  . Asthma    HPI Julie Cardenas is a 34 y.o. female.   34 year old female comes in for 2 week of URI symptoms/asthma exacerbation. Has been having cough, wheezing, shortness of breat. Denies fever, chills, body aches. Denies abdominal pain, nausea, vomiting, diarrhea. Denies loss of taste/smell. Symptoms were at first controlled with albuterol with intermittent ipratropium inhaler use. However, now waking up at night coughing for the past 5 days. Continued albuterol without relief. Ran out of ipratropium.      Past Medical History:  Diagnosis Date  . Anxiety    no medicine, prayers aid her   . Asthma   . Bronchitis   . Depression   . Family history of anesthesia complication    mother slow waking up  . Headache(784.0)    sinus related  . Leg cramps    PCP- advised her to exercise   . Seasonal allergies   . Shortness of breath     Patient Active Problem List   Diagnosis Date Noted  . Major depressive disorder, recurrent episode, moderate (HCC) 12/05/2015  . MDD (major depressive disorder), recurrent severe, 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/2015  . Major depressive disorder, recurrent episode, moderate degree (HCC) 12/05/2015  . Depression 10/04/2014  . Generalized anxiety disorder 10/04/2014  . Nasal polyps 09/13/2013  . Sinusitis, chronic 09/13/2013    Past Surgical History:  Procedure Laterality Date  . SINUS ENDO W/FUSION Right 09/13/2013   Procedure: RIGHT ENDOSCOPIC SINUS SURGERY WITH FUSION SCAN;  Surgeon: Osborn Coho, MD;  [facility];  Service: ENT;  Laterality: Right;  . SINUS ENDO W/FUSION Left 02/01/2014   Procedure: LEFT ENDOSCOPIC SINUS SURGERY/NASAL POLYPECTOMY WITH FUSION;  Surgeon: Osborn Coho, MD;  Location: [facility] OR;  Service: ENT;  Laterality: Left;  . sinus polyps removed      OB History     Gravida  1   Para      Term      Preterm      AB      Living        SAB      TAB      Ectopic      Multiple      Live Births               Home Medications    Prior to Admission medications   Medication Sig Start Date End Date Taking? Authorizing Provider  albuterol (PROVENTIL) (2.5 MG/3ML) 0.083% nebulizer solution Take 3 mLs (2.5 mg total) by nebulization every 6 (six) hours as needed for wheezing or shortness of breath. 11/14/19   Cathie Hoops, Kataleena Holsapple V, PA-C  albuterol (VENTOLIN HFA) 108 (90 Base) MCG/ACT inhaler Inhale 1-2 puffs into the lungs every 6 (six) hours as needed for wheezing or shortness of breath. 11/14/19   Cathie Hoops, Kierre Hintz V, PA-C  cetirizine (ZYRTEC ALLERGY) 10 MG tablet Take 1 tablet (10 mg total) by mouth daily. 02/19/19 03/21/19  Jeannie Fend, PA-C  fluticasone (FLONASE) 50 MCG/ACT nasal spray Place 1 spray into both nostrils daily. 02/19/19   Jeannie Fend, PA-C  ibuprofen (ADVIL,MOTRIN) 600 MG tablet Take 1 tablet (600 mg total) by mouth every 6 (six) hours as needed. Patient not taking: Reported on 02/15/2017 12/20/16   Marny Lowenstein, PA-C  ipratropium-albuterol (DUONEB) 0.5-2.5 (3)  MG/3ML SOLN Take 3 mLs by nebulization every 6 (six) hours as needed. 11/14/19   Tasia Catchings, Ogechi Kuehnel V, PA-C  montelukast (SINGULAIR) 10 MG tablet Take 1 tablet (10 mg total) by mouth at bedtime. 02/19/19 03/21/19  Tacy Learn, PA-C  predniSONE (DELTASONE) 50 MG tablet Take 1 tablet (50 mg total) by mouth daily with breakfast. 11/14/19   Ok Edwards, PA-C    Family History Family History  Problem Relation Age of Onset  . Diabetes Mother   . Cancer Mother   . Hypertension Mother     Social History Social History   Tobacco Use  . Smoking status: Never Smoker  . Smokeless tobacco: Never Used  Vaping Use  . Vaping Use: Never used  Substance Use Topics  . Alcohol use: Yes    Comment: occas.  . Drug use: No     Allergies   Naproxen   Review of Systems Review of Systems  Reason  unable to perform ROS: See HPI as above.     Physical Exam Triage Vital Signs ED Triage Vitals [11/14/19 0815]  Enc Vitals Group     BP 133/88     Pulse Rate 94     Resp 18     Temp 98.1 F (36.7 C)     Temp Source Oral     SpO2 97 %     Weight      Height      Head Circumference      Peak Flow      Pain Score 7     Pain Loc      Pain Edu?      Excl. in [place]?    No data found.  Updated Vital Signs BP 133/88 (BP Location: Left Arm)   Pulse 94   Temp 98.1 F (36.7 C) (Oral)   Resp 18   LMP 10/22/2019   SpO2 97%   Breastfeeding No   Physical Exam Constitutional:      General: She is not in acute distress.    Appearance: Normal appearance. She is well-developed. She is not toxic-appearing or diaphoretic.  HENT:     Head: Normocephalic and atraumatic.  Eyes:     Conjunctiva/sclera: Conjunctivae normal.     Pupils: Pupils are equal, round, and reactive to light.  Cardiovascular:     Rate and Rhythm: Normal rate and regular rhythm.  Pulmonary:     Effort: Pulmonary effort is normal. No respiratory distress.     Comments: Frequent dry cough throughout the visit. LCTAB Musculoskeletal:     Cervical back: Normal range of motion and neck supple.  Skin:    General: Skin is warm and dry.  Neurological:     Mental Status: She is alert and oriented to person, place, and time.      UC Treatments / Results  Labs (all labs ordered are listed, but only abnormal results are displayed) Labs Reviewed - No data to display  EKG   Radiology No results found.  Procedures Procedures (including critical care time)  Medications Ordered in UC Medications - No data to display  Initial Impression / Assessment and Plan / UC Course  I have reviewed the triage vital signs and the nursing notes.  Pertinent labs & imaging results that were available during my care of the patient were reviewed by me and considered in my medical decision making (see chart for details).      Patient with 3-4 exacerbations in the past 6 months. Has  PCP appt for establishment in 1 month. Discussed prednisone for current symptoms. Will provide nebulizer machine for duoneb/albuterol treatments as well. Return precautions given. Otherwise patient to follow up with PCP as scheduled for management of asthma.  Final Clinical Impressions(s) / UC Diagnoses   Final diagnoses:  Mild persistent asthma with acute exacerbation   ED Prescriptions    Medication Sig Dispense Auth. Provider   albuterol (VENTOLIN HFA) 108 (90 Base) MCG/ACT inhaler Inhale 1-2 puffs into the lungs every 6 (six) hours as needed for wheezing or shortness of breath. 8 g Rosali Augello V, PA-C   ipratropium-albuterol (DUONEB) 0.5-2.5 (3) MG/3ML SOLN Take 3 mLs by nebulization every 6 (six) hours as needed. 90 mL Shany Marinez V, PA-C   predniSONE (DELTASONE) 50 MG tablet Take 1 tablet (50 mg total) by mouth daily with breakfast. 5 tablet Quadarius Henton V, PA-C   albuterol (PROVENTIL) (2.5 MG/3ML) 0.083% nebulizer solution Take 3 mLs (2.5 mg total) by nebulization every 6 (six) hours as needed for wheezing or shortness of breath. 75 mL Belinda Fisher, PA-C     PDMP not reviewed this encounter.   Belinda Fisher, PA-C 11/14/19 [phone]

## 2019-11-14 NOTE — ED Triage Notes (Signed)
Pt c/o asthma exacerbation x2wks, now having a cough with this sputum and having a headache. States having SOB on exertion.

## 2019-12-11 ENCOUNTER — Other Ambulatory Visit: Payer: Self-pay | Admitting: Pharmacist

## 2019-12-11 ENCOUNTER — Other Ambulatory Visit: Payer: Self-pay

## 2019-12-11 ENCOUNTER — Encounter: Payer: Self-pay | Admitting: Family Medicine

## 2019-12-11 ENCOUNTER — Ambulatory Visit: Payer: Self-pay | Attending: Family Medicine | Admitting: Family Medicine

## 2019-12-11 VITALS — BP 118/78 | HR 107 | Ht 65.0 in | Wt 286.0 lb

## 2019-12-11 DIAGNOSIS — Z13228 Encounter for screening for other metabolic disorders: Secondary | ICD-10-CM

## 2019-12-11 DIAGNOSIS — J4540 Moderate persistent asthma, uncomplicated: Secondary | ICD-10-CM

## 2019-12-11 DIAGNOSIS — Z6841 Body Mass Index (BMI) 40.0 and over, adult: Secondary | ICD-10-CM

## 2019-12-11 DIAGNOSIS — Z131 Encounter for screening for diabetes mellitus: Secondary | ICD-10-CM

## 2019-12-11 LAB — POCT GLYCOSYLATED HEMOGLOBIN (HGB A1C): HbA1c, POC (controlled diabetic range): 5.7 % (ref 0.0–7.0)

## 2019-12-11 MED ORDER — ALBUTEROL SULFATE HFA 108 (90 BASE) MCG/ACT IN AERS: 1.0000 | INHALATION_SPRAY | Freq: Four times a day (QID) | RESPIRATORY_TRACT | 1 refills | Status: DC | PRN

## 2019-12-11 MED ORDER — DULERA 100-5 MCG/ACT IN AERO: 2.0000 | INHALATION_SPRAY | Freq: Two times a day (BID) | RESPIRATORY_TRACT | 3 refills | Status: DC

## 2019-12-11 MED ORDER — MONTELUKAST SODIUM 10 MG PO TABS: 10.0000 mg | ORAL_TABLET | Freq: Every day | ORAL | 3 refills | Status: DC

## 2019-12-11 MED FILL — MONTELUKAST SOD 10 MG TAB: 10 | 30 days supply | Qty: 30 | Fill #0

## 2019-12-11 MED FILL — !DULERA 100 MCG/5 MCG INH: 100-5 | 30 days supply | Qty: 13 | Fill #0

## 2019-12-11 MED FILL — !VENTOLIN HFA INHALER: 108 (90 BAS | 25 days supply | Qty: 18 | Fill #0

## 2019-12-11 NOTE — Patient Instructions (Signed)

## 2019-12-11 NOTE — Progress Notes (Signed)
Subjective:  Patient ID: Julie Cardenas, female    DOB: April 17, 1986  Age: 34 y.o. MRN: [id number]  CC: Hospitalization Follow-up and Asthma   HPI Julie Cardenas is a 34 year old female with a history of obesity, asthma who presents today to establish care. She was previously followed by [facility] but then lost her insurance and has not had a primary care physician since then.  She was on Breo at the time for asthma management.  Since then she has had multiple ED visit for asthma exacerbation and treatment involve use of prednisone, Singulair and albuterol MDI.  She is currently on Albuterol MDI prn but has no maintenance medication. Uses  Her MDI about 5x/day and she has wheezing and has been coughing white yellow mucus during the day and night She used a nebulizer treatment just before her office visit this morning. She has gained 50 pounds over the last year as she is mostly sedentary with her job at [employer]. Past Medical History:  Diagnosis Date  . Anxiety    no medicine, prayers aid her   . Asthma   . Bronchitis   . Depression   . Family history of anesthesia complication    mother slow waking up  . Headache(784.0)    sinus related  . Leg cramps    PCP- advised her to exercise   . Seasonal allergies   . Shortness of breath     Past Surgical History:  Procedure Laterality Date  . SINUS ENDO W/FUSION Right 09/13/2013   Procedure: RIGHT ENDOSCOPIC SINUS SURGERY WITH FUSION SCAN;  Surgeon: [name], MD;  Location: [place];  Service: ENT;  Laterality: Right;  . SINUS ENDO W/FUSION Left 02/01/2014   Procedure: LEFT ENDOSCOPIC SINUS SURGERY/NASAL POLYPECTOMY WITH FUSION;  Surgeon: [name], MD;  Location: [facility] OR;  Service: ENT;  Laterality: Left;  . sinus polyps removed      Family History  Problem Relation Age of Onset  . Diabetes Mother   . Cancer Mother   . Hypertension Mother     Allergies  Allergen Reactions  . Naproxen Diarrhea, Nausea And Vomiting and  Other (See Comments)    Severe abdominal pain     Outpatient Medications Prior to Visit  Medication Sig Dispense Refill  . fluticasone (FLONASE) 50 MCG/ACT nasal spray Place 1 spray into both nostrils daily. 16 g 2  . ipratropium-albuterol (DUONEB) 0.5-2.5 (3) MG/3ML SOLN Take 3 mLs by nebulization every 6 (six) hours as needed. 90 mL 0  . albuterol (VENTOLIN HFA) 108 (90 Base) MCG/ACT inhaler Inhale 1-2 puffs into the lungs every 6 (six) hours as needed for wheezing or shortness of breath. 8 g 1  . albuterol (PROVENTIL) (2.5 MG/3ML) 0.083% nebulizer solution Take 3 mLs (2.5 mg total) by nebulization every 6 (six) hours as needed for wheezing or shortness of breath. (Patient not taking: Reported on 12/11/2019) 75 mL 0  . cetirizine (ZYRTEC ALLERGY) 10 MG tablet Take 1 tablet (10 mg total) by mouth daily. 30 tablet 0  . ibuprofen (ADVIL,MOTRIN) 600 MG tablet Take 1 tablet (600 mg total) by mouth every 6 (six) hours as needed. (Patient not taking: Reported on 02/15/2017) 30 tablet 0  . montelukast (SINGULAIR) 10 MG tablet Take 1 tablet (10 mg total) by mouth at bedtime. 30 tablet 1  . predniSONE (DELTASONE) 50 MG tablet Take 1 tablet (50 mg total) by mouth daily with breakfast. (Patient not taking: Reported on 12/11/2019) 5 tablet 0   No  facility-administered medications prior to visit.     ROS Review of Systems  Constitutional: Negative for activity change, appetite change and fatigue.  HENT: Negative for congestion, sinus pressure and sore throat.   Eyes: Negative for visual disturbance.  Respiratory: Positive for cough, shortness of breath and wheezing. Negative for chest tightness.   Cardiovascular: Negative for chest pain and palpitations.  Gastrointestinal: Negative for abdominal distention, abdominal pain and constipation.  Endocrine: Negative for polydipsia.  Genitourinary: Negative for dysuria and frequency.  Musculoskeletal: Negative for arthralgias and back pain.  Skin: Negative  for rash.  Neurological: Negative for tremors, light-headedness and numbness.  Hematological: Does not bruise/bleed easily.  Psychiatric/Behavioral: Negative for agitation and behavioral problems.    Objective:  BP 118/78   Pulse (!) 107   Ht '5\' 5"'  (1.651 m)   Wt 286 lb (129.7 kg)   SpO2 98%   BMI 47.59 kg/m   BP/Weight 12/11/2019 6/37/8588 5/0/2774  Systolic BP 128 786 767  Diastolic BP 78 88 62  Wt. (Lbs) 286 - 275  BMI 47.59 - 45.76  Some encounter information is confidential and restricted. Go to Review Flowsheets activity to see all data.      Physical Exam Constitutional:      Appearance: She is well-developed.  Neck:     Vascular: No JVD.  Cardiovascular:     Rate and Rhythm: Tachycardia present.     Heart sounds: Normal heart sounds. No murmur heard.   Pulmonary:     Effort: Pulmonary effort is normal.     Breath sounds: Normal breath sounds. No wheezing or rales.  Chest:     Chest wall: No tenderness.  Abdominal:     General: Bowel sounds are normal. There is no distension.     Palpations: Abdomen is soft. There is no mass.     Tenderness: There is no abdominal tenderness.  Musculoskeletal:        General: Normal range of motion.     Right lower leg: No edema.     Left lower leg: No edema.  Neurological:     Mental Status: She is alert and oriented to person, place, and time.  Psychiatric:        Mood and Affect: Mood normal.     CMP Latest Ref Rng & Units 02/19/2019 02/14/2017 12/05/2015  Glucose 70 - 99 mg/dL 127(H) 94 107(H)  BUN 6 - 20 mg/dL '9 10 7  ' Creatinine 0.44 - 1.00 mg/dL 0.77 0.83 0.68  Sodium 135 - 145 mmol/L 137 139 136  Potassium 3.5 - 5.1 mmol/L 4.0 3.8 3.8  Chloride 98 - 111 mmol/L 105 107 107  CO2 22 - 32 mmol/L '23 24 22  ' Calcium 8.9 - 10.3 mg/dL 8.9 9.2 9.1  Total Protein 6.5 - 8.1 g/dL - - 8.0  Total Bilirubin 0.3 - 1.2 mg/dL - - 1.0  Alkaline Phos 38 - 126 U/L - - 66  AST 15 - 41 U/L - - 16  ALT 14 - 54 U/L - - 12(L)     Lipid Panel  No results found for: CHOL, TRIG, HDL, CHOLHDL, VLDL, LDLCALC, LDLDIRECT  CBC    Component Value Date/Time   WBC 5.4 02/19/2019 1023   RBC 4.85 02/19/2019 1023   HGB 13.2 02/19/2019 1023   HCT 42.0 02/19/2019 1023   PLT 357 02/19/2019 1023   MCV 86.6 02/19/2019 1023   MCH 27.2 02/19/2019 1023   MCHC 31.4 02/19/2019 1023   RDW 13.8 02/19/2019 1023  LYMPHSABS 1.2 07/31/2015 1100   MONOABS 0.4 07/31/2015 1100   EOSABS 0.2 07/31/2015 1100   BASOSABS 0.0 07/31/2015 1100    Lab Results  Component Value Date   HGBA1C 5.7 12/11/2019    Assessment & Plan:  1. Screening for diabetes mellitus Prediabetes with A1c of 5.7 Lifestyle modifications - POCT glycosylated hemoglobin (Hb A1C)  2. Moderate persistent asthma without complication Uncontrolled due to suboptimal treatment We will place on Dulera and reassess at next visit Continue Singulair and Proventil MDI - montelukast (SINGULAIR) 10 MG tablet; Take 1 tablet (10 mg total) by mouth at bedtime.  Dispense: 30 tablet; Refill: 3 - mometasone-formoterol (DULERA) 100-5 MCG/ACT AERO; Inhale 2 puffs into the lungs in the morning and at bedtime.  Dispense: 13 g; Refill: 3  3. Screening for metabolic disorder - [id number]+UXYB - CBC with Differential/Platelet  4. Class 3 severe obesity due to excess calories without serious comorbidity with body mass index (BMI) of 45.0 to 49.9 in adult [facility]) Counseled on reducing portion sizes, 150 minutes of exercise per week, avoiding late meals She will start by walking 10 minutes/day   Meds ordered this encounter  Medications  . DISCONTD: albuterol (VENTOLIN HFA) 108 (90 Base) MCG/ACT inhaler    Sig: Inhale 1-2 puffs into the lungs every 6 (six) hours as needed for wheezing or shortness of breath.    Dispense:  8 g    Refill:  1  . montelukast (SINGULAIR) 10 MG tablet    Sig: Take 1 tablet (10 mg total) by mouth at bedtime.    Dispense:  30 tablet    Refill:  3  .  mometasone-formoterol (DULERA) 100-5 MCG/ACT AERO    Sig: Inhale 2 puffs into the lungs in the morning and at bedtime.    Dispense:  13 g    Refill:  3    Follow-up: Return in about 6 weeks (around 01/22/2020) for Follow-up of asthma.       Charlott Rakes, MD, FAAFP. [facility], [place]   12/11/2019, 1:03 PM

## 2019-12-11 NOTE — Progress Notes (Signed)
Wants to discuss her frequent asthma attacks.

## 2019-12-12 LAB — CMP14+EGFR
ALT: 10 IU/L (ref 0–32)
AST: 12 IU/L (ref 0–40)
Albumin/Globulin Ratio: 1.6 (ref 1.2–2.2)
Albumin: 4.5 g/dL (ref 3.8–4.8)
Alkaline Phosphatase: 82 IU/L (ref 48–121)
BUN/Creatinine Ratio: 8 — ABNORMAL LOW (ref 9–23)
BUN: 7 mg/dL (ref 6–20)
Bilirubin Total: 0.2 mg/dL (ref 0.0–1.2)
CO2: 20 mmol/L (ref 20–29)
Calcium: 9.4 mg/dL (ref 8.7–10.2)
Chloride: 102 mmol/L (ref 96–106)
Creatinine, Ser: 0.85 mg/dL (ref 0.57–1.00)
GFR calc Af Amer: 103 mL/min/{1.73_m2} (ref >59)
GFR calc non Af Amer: 90 mL/min/{1.73_m2} (ref >59)
Globulin, Total: 2.9 g/dL (ref 1.5–4.5)
Glucose: 87 mg/dL (ref 65–99)
Potassium: 4.5 mmol/L (ref 3.5–5.2)
Sodium: 136 mmol/L (ref 134–144)
Total Protein: 7.4 g/dL (ref 6.0–8.5)

## 2019-12-12 LAB — CBC WITH DIFFERENTIAL/PLATELET
Basophils Absolute: 0 10*3/uL (ref 0.0–0.2)
Basos: 1 %
EOS (ABSOLUTE): 0.3 10*3/uL (ref 0.0–0.4)
Eos: 5 %
Hematocrit: 42.2 % (ref 34.0–46.6)
Hemoglobin: 13.5 g/dL (ref 11.1–15.9)
Immature Grans (Abs): 0 10*3/uL (ref 0.0–0.1)
Immature Granulocytes: 0 %
Lymphocytes Absolute: 2 10*3/uL (ref 0.7–3.1)
Lymphs: 35 %
MCH: 26.5 pg — ABNORMAL LOW (ref 26.6–33.0)
MCHC: 32 g/dL (ref 31.5–35.7)
MCV: 83 fL (ref 79–97)
Monocytes Absolute: 0.6 10*3/uL (ref 0.1–0.9)
Monocytes: 10 %
Neutrophils Absolute: 2.9 10*3/uL (ref 1.4–7.0)
Neutrophils: 49 %
Platelets: 360 10*3/uL (ref 150–450)
RBC: 5.09 x10E6/uL (ref 3.77–5.28)
RDW: 14.1 % (ref 11.7–15.4)
WBC: 5.8 10*3/uL (ref 3.4–10.8)

## 2019-12-13 ENCOUNTER — Telehealth: Payer: Self-pay

## 2019-12-13 NOTE — Telephone Encounter (Signed)
Patient name and DOB has been verified Patient was informed of lab results. Patient had no questions.  

## 2019-12-13 NOTE — Telephone Encounter (Signed)
-----   Message from Hoy Register, MD sent at 12/12/2019  5:58 PM EDT ----- Please inform the patient that labs are normal. Thank you.

## 2020-01-11 MED FILL — MONTELUKAST SOD 10 MG TAB: 10 | 30 days supply | Qty: 30 | Fill #1

## 2020-01-18 MED FILL — DULERA 100 MCG/5 MCG INH: 100-5 | 30 days supply | Qty: 13 | Fill #1

## 2020-01-23 ENCOUNTER — Ambulatory Visit: Payer: Self-pay | Attending: Family Medicine | Admitting: Family Medicine

## 2020-01-23 ENCOUNTER — Other Ambulatory Visit: Payer: Self-pay

## 2020-01-23 DIAGNOSIS — J4540 Moderate persistent asthma, uncomplicated: Secondary | ICD-10-CM

## 2020-01-23 MED ORDER — ALBUTEROL SULFATE HFA 108 (90 BASE) MCG/ACT IN AERS: 1.0000 | INHALATION_SPRAY | Freq: Four times a day (QID) | RESPIRATORY_TRACT | 3 refills | Status: DC | PRN

## 2020-01-23 NOTE — Progress Notes (Signed)
Virtual Visit via Telephone Note  I connected with Julie Cardenas, on 01/23/2020 at 9:03 AM by telephone due to the COVID-19 pandemic and verified that I am speaking with the correct person using two identifiers.   Consent: I discussed the limitations, risks, security and privacy concerns of performing an evaluation and management service by telephone and the availability of in person appointments. I also discussed with the patient that there may be a patient responsible charge related to this service. The patient expressed understanding and agreed to proceed.   Location of Patient: Home  Location of Provider: Clinic   Persons participating in Telemedicine visit: Julie Cardenas Alicia Farrington-CMA Dr. Alvis Lemmings     History of Present Illness: 34 year old female with a history of asthma who was seen at her last office visit for recurrent asthma exacerbations and was commenced on Dulera, Proventil and Singulair.  She reports asthma has been stable with no exacerbations but has noticed that While on Singulair her menstrual cramps have worsened. She has no chest pain, dyspnea, wheezing and her exercise tolerance is great. She has no additional concerns today.  Past Medical History:  Diagnosis Date  . Anxiety    no medicine, prayers aid her   . Asthma   . Bronchitis   . Depression   . Family history of anesthesia complication    mother slow waking up  . Headache(784.0)    sinus related  . Leg cramps    PCP- advised her to exercise   . Seasonal allergies   . Shortness of breath    Allergies  Allergen Reactions  . Naproxen Diarrhea, Nausea And Vomiting and Other (See Comments)    Severe abdominal pain     Current Outpatient Medications on File Prior to Visit  Medication Sig Dispense Refill  . albuterol (VENTOLIN HFA) 108 (90 Base) MCG/ACT inhaler Inhale 1-2 puffs into the lungs every 6 (six) hours as needed for wheezing or shortness of breath. 8.5 g 1  . fluticasone  (FLONASE) 50 MCG/ACT nasal spray Place 1 spray into both nostrils daily. 16 g 2  . ipratropium-albuterol (DUONEB) 0.5-2.5 (3) MG/3ML SOLN Take 3 mLs by nebulization every 6 (six) hours as needed. 90 mL 0  . mometasone-formoterol (DULERA) 100-5 MCG/ACT AERO Inhale 2 puffs into the lungs in the morning and at bedtime. 13 g 3  . albuterol (PROVENTIL) (2.5 MG/3ML) 0.083% nebulizer solution Take 3 mLs (2.5 mg total) by nebulization every 6 (six) hours as needed for wheezing or shortness of breath. (Patient not taking: Reported on 12/11/2019) 75 mL 0  . cetirizine (ZYRTEC ALLERGY) 10 MG tablet Take 1 tablet (10 mg total) by mouth daily. 30 tablet 0  . montelukast (SINGULAIR) 10 MG tablet Take 1 tablet (10 mg total) by mouth at bedtime. 30 tablet 3   No current facility-administered medications on file prior to visit.    Observations/Objective: Awake, alert, oriented x3 Not in acute distress  Assessment and Plan: 1. Moderate persistent asthma without complication Controlled Continue Dulera, Proventil She does have increased menstrual cramps with Singulair and has been advised to hold off on Singulair for now.  If asthma is controlled while not on Singulair we will consider discontinuing Singulair but if asthma is uncontrolled well Singulair is held she will resume Singulair and use an NSAID for her dysmenorrhea Advised to complete medication assistance paperwork from the pharmacy to ensure constant supply of Dulera. - albuterol (VENTOLIN HFA) 108 (90 Base) MCG/ACT inhaler; Inhale 1-2 puffs into the  lungs every 6 (six) hours as needed for wheezing or shortness of breath.  Dispense: 8.5 g; Refill: 3   Follow Up Instructions: 1 month for complete physical exam   I discussed the assessment and treatment plan with the patient. The patient was provided an opportunity to ask questions and all were answered. The patient agreed with the plan and demonstrated an understanding of the instructions.   The  patient was advised to call back or seek an in-person evaluation if the symptoms worsen or if the condition fails to improve as anticipated.     I provided 11 minutes total of non-face-to-face time during this encounter including median intraservice time, reviewing previous notes, investigations, ordering medications, medical decision making, coordinating care and patient verbalized understanding at the end of the visit.     Hoy Register, MD, FAAFP. [facility] [place], [place] [phone]   01/23/2020, 9:03 AM

## 2020-01-23 NOTE — Progress Notes (Signed)
States that the Singular is making her feel sick, states that when she takes the medication while she having her menstrual the medication makes her cramps worse.

## 2020-01-28 MED FILL — !DULERA 100 MCG/5 MCG INH: 100-5 | 30 days supply | Qty: 13 | Fill #1

## 2020-02-01 ENCOUNTER — Ambulatory Visit: Payer: Self-pay | Admitting: *Deleted

## 2020-02-01 NOTE — Telephone Encounter (Signed)
Message from Geronimo Boot sent at 02/01/2020 12:22 PM EDT  Summary: Clinical Advice   Patient would like to speak with nurse in regards to if she and her family should quarantine due to a possible exposure. Please adivse          Patient calling for quarantine instructions due to mother testing positive for COVD-19 today. Patient states that she lives with her mother and is currently on the way to be tested along with her sister who also lives in the home. Patient states that she began to have symptoms of cough, SOB, headache and chest pain around Tuesday or Wednesday of this week. Patient states that she has a history of asthma and thought she may have been experiencing symptoms as a result of not being on maintenance medications. Patient provided with instructions for quarantine and home care advice. Patient advised to return call for worsening symptoms. Patient advised to seek treatment in the ED for worsening SOB and chest pain. Patient verbalized understanding.   Reason for Disposition . [1] HIGH RISK patient (e.g., age > 64 years, diabetes, heart or lung disease, weak immune system) AND [2] new or worsening symptoms  Answer Assessment - Initial Assessment Questions 1. COVID-19 CLOSE CONTACT: "Who is the person with the confirmed or suspected COVID-19 infection that you were exposed to?"     Patient states that her mother tested positive for COVID today 2. PLACE of CONTACT: "Where were you when you were exposed to COVID-19?" (e.g., home, school, medical waiting room; which city?)     home 3. TYPE of CONTACT: "How much contact was there?" (e.g., sitting next to, live in same house, work in same office, same building)     Live in the same home 4. DURATION of CONTACT: "How long were you in contact with the COVID-19 patient?" (e.g., a few seconds, passed by person, a few minutes, 15 minutes or longer, live with the patient)    Lives with her mother who tested positive 5. MASK: "Were you  wearing a mask?" "Was the other person wearing a mask?" Note: wearing a mask reduces the risk of an otherwise close contact.     No 6. DATE of CONTACT: "When did you have contact with a COVID-19 patient?" (e.g., how many days ago)     Lives with mother and drove her mother to be tetsed 7. COMMUNITY SPREAD: "Are there lots of cases of COVID-19 (community spread) where you live?" (See public health department website, if unsure)       yes 8. SYMPTOMS: "Do you have any symptoms?" (e.g., fever, cough, breathing difficulty, loss of taste or smell)    Cough, Shortness of breath, headache chest pain- was unsure if it was due to being off asthma medications 9. PREGNANCY OR POSTPARTUM: "Is there any chance you are pregnant?" "When was your last menstrual period?" "Did you deliver in the last 2 weeks?"     Not assessed 10. HIGH RISK: "Do you have any heart or lung problems?" "Do you have a weak immune system?" (e.g., heart failure, COPD, asthma, HIV positive, chemotherapy, renal failure, diabetes mellitus, sickle cell anemia, obesity)       Asthma and axiety 11. TRAVEL: "Have you traveled out of the country recently?" If Yes, ask: "When and where?" Also ask about out-of-state travel, since the CDC has identified some high-risk cities for community spread in the [place]. Note: Travel becomes less relevant if there is widespread community transmission where the patient lives.  Not assessed  Protocols used: CORONAVIRUS (COVID-19) DIAGNOSED OR SUSPECTED-A-AH, CORONAVIRUS (COVID-19) EXPOSURE-A-AH

## 2020-02-08 ENCOUNTER — Encounter: Payer: Self-pay | Admitting: Family Medicine

## 2020-02-08 ENCOUNTER — Ambulatory Visit: Payer: Self-pay | Attending: Family Medicine | Admitting: Family Medicine

## 2020-02-08 DIAGNOSIS — R110 Nausea: Secondary | ICD-10-CM

## 2020-02-08 DIAGNOSIS — R0602 Shortness of breath: Secondary | ICD-10-CM

## 2020-02-08 DIAGNOSIS — R05 Cough: Secondary | ICD-10-CM

## 2020-02-08 DIAGNOSIS — J4541 Moderate persistent asthma with (acute) exacerbation: Secondary | ICD-10-CM

## 2020-02-08 DIAGNOSIS — Z20822 Contact with and (suspected) exposure to covid-19: Secondary | ICD-10-CM

## 2020-02-08 DIAGNOSIS — R058 Other specified cough: Secondary | ICD-10-CM

## 2020-02-08 MED ORDER — FAMOTIDINE 20 MG PO TABS: 20.0000 mg | ORAL_TABLET | Freq: Two times a day (BID) | ORAL | 3 refills | Status: DC

## 2020-02-08 MED ORDER — PREDNISONE 20 MG PO TABS: ORAL_TABLET | ORAL | 0 refills | Status: DC

## 2020-02-08 MED ORDER — ONDANSETRON HCL 4 MG PO TABS: 4.0000 mg | ORAL_TABLET | Freq: Three times a day (TID) | ORAL | 0 refills | Status: DC | PRN

## 2020-02-08 MED ORDER — AZITHROMYCIN 250 MG PO TABS: ORAL_TABLET | ORAL | 0 refills | Status: DC

## 2020-02-08 MED FILL — AZITHROMYCIN 250 MG TABLET: 250 | 5 days supply | Qty: 6 | Fill #0

## 2020-02-08 MED FILL — predniSONE 20 MG TABS: 20 | 9 days supply | Qty: 11 | Fill #0

## 2020-02-08 MED FILL — FAMOTIDINE 20 MG TABS: 20 | 30 days supply | Qty: 60 | Fill #0

## 2020-02-08 MED FILL — ONDANSETRON HCL 4 MG TABLET: 4 | 6 days supply | Qty: 20 | Fill #0

## 2020-02-08 NOTE — Progress Notes (Signed)
States that her mom was tested positive for covid she got tested and she was negative. She is now feeling sick with cough, loss of smell,chills, and nausea.

## 2020-02-08 NOTE — Progress Notes (Signed)
Virtual Visit via Telephone Note  I connected with Julie Cardenas on 02/08/20 at  8:30 AM EDT by telephone and verified that I am speaking with the correct person using two identifiers.   I discussed the limitations, risks, security and privacy concerns of performing an evaluation and management service by telephone and the availability of in person appointments. I also discussed with the patient that there may be a patient responsible charge related to this service. The patient expressed understanding and agreed to proceed.  Patient Location: Home Provider Location: [facility] Others participating in call: call initiated by Harle Stanford who then transferred the call to me   History of Present Illness:      34 year old female with moderate persistent asthma recently started on Dulera, who reports recent onset of cough productive of yellow to green sputum, chills, loss of appetite, headaches, body aches, decreased sense of smell and abnormal sense of taste since Saturday and symptoms have progressively worsened.  She has also had episodes of dizziness and recurrent burping.  She reports that her mother has been ill over the past 2 to 3 weeks and last Friday, mother went to her doctor and had positive test for COVID-19.  Patient lives in the same household with mother and sister.  Patient and her sister were tested for Covid on the day of mother's diagnosis and both of their tests were negative.  Patient however had onset of symptoms a few days after her negative test.  She also reports fatigue.  She has increased shortness of breath while wearing a mask as she is helping prepare meals and caring for her mother.  She also gets some sensation of increased anxiety/panic attacks due to onset of shortness of breath when wearing a mask.  When she feels more anxious, she also has onset of wheezing.  She has been self isolating.          Past Medical History:  Diagnosis Date  . Anxiety    no  medicine, prayers aid her   . Asthma   . Bronchitis   . Depression   . Family history of anesthesia complication    mother slow waking up  . Headache(784.0)    sinus related  . Leg cramps    PCP- advised her to exercise   . Seasonal allergies   . Shortness of breath     Past Surgical History:  Procedure Laterality Date  . SINUS ENDO W/FUSION Right 09/13/2013   Procedure: RIGHT ENDOSCOPIC SINUS SURGERY WITH FUSION SCAN;  Surgeon: Osborn Coho, MD;  Location: [facility] OR;  Service: ENT;  Laterality: Right;  . SINUS ENDO W/FUSION Left 02/01/2014   Procedure: LEFT ENDOSCOPIC SINUS SURGERY/NASAL POLYPECTOMY WITH FUSION;  Surgeon: Osborn Coho, MD;  Location: [facility] OR;  Service: ENT;  Laterality: Left;  . sinus polyps removed      Family History  Problem Relation Age of Onset  . Diabetes Mother   . Cancer Mother   . Hypertension Mother     Social History   Tobacco Use  . Smoking status: Never Smoker  . Smokeless tobacco: Never Used  Vaping Use  . Vaping Use: Never used  Substance Use Topics  . Alcohol use: Yes    Comment: occas.  . Drug use: No     Allergies  Allergen Reactions  . Naproxen Diarrhea, Nausea And Vomiting and Other (See Comments)    Severe abdominal pain        Observations/Objective: No vital signs or  physical exam conducted as visit was done via telephone.  Patient was able to speak in full sentences without stopping due to shortness of breath, occasional mild cough.  Assessment and Plan: 1. Shortness of breath with exposure to COVID-19 virus; 2.  Cough productive of purulent sputum Patient with exposure to mother/household member with recent positive COVID-19 infection.  Patient had negative COVID-19 test however then developed symptoms consistent with COVID-19.  Patient agrees to MyChart COVID-19 home monitoring program and orders placed.  Patient will be placed on azithromycin Z-Pak in case of secondary bacterial infection after presumed COVID-19 infection.   She is encouraged to rest and remain well-hydrated.  Continue self-isolation for at least 10 days from start of symptoms and or until no fever x3 days.  She was also advised to obtain over-the-counter medication such as Robitussin-DM to help with cough and chest congestion.  If she develops increased shortness of breath, chest pain, chest tightness or any otherconcerns, she should seek medical attention at the emergency department. - MyChart COVID-19 home monitoring program - azithromycin (ZITHROMAX) 250 MG tablet; Take 2 pills today then one pill daily for 4 days  Dispense: 6 tablet; Refill: 0  3. Moderate persistent asthma with exacerbation Continue home medications for asthma along with use of albuterol inhaler as needed for cough/shortness of breath or wheezing.  Prescription given for azithromycin Z-Pak due to purulent sputum.  Prescription also being given for prednisone taper if she experiences increased chest tightness, wheezing or shortness of breath. - azithromycin (ZITHROMAX) 250 MG tablet; Take 2 pills today then one pill daily for 4 days  Dispense: 6 tablet; Refill: 0 - predniSONE (DELTASONE) 20 MG tablet; 3 pills today then 2 pills daily x 2 days, 1 pill daily x 2 days then 1/2 pill daily for 4 days for wheezing/chest tightness  Dispense: 11 tablet; Refill: 0  4. Nausea Patient with decreased appetite, decreased food intake.  Prescription provided for Pepcid 20 mg patient reports nausea and recurrent burping.  We will also provide prescription for Zofran to take as needed for nausea.  She is encouraged to remain well-hydrated. - ondansetron (ZOFRAN) 4 MG tablet; Take 1 tablet (4 mg total) by mouth every 8 (eight) hours as needed for nausea or vomiting.  Dispense: 20 tablet; Refill: 0 - famotidine (PEPCID) 20 MG tablet; Take 1 tablet (20 mg total) by mouth 2 (two) times daily. To reduce stomach acid  Dispense: 60 tablet; Refill: 3  Follow Up Instructions:Return in about 1 week (around  02/15/2020) for Productive cough/shortness of breath-emergency department if symptoms worsen.    I discussed the assessment and treatment plan with the patient. The patient was provided an opportunity to ask questions and all were answered. The patient agreed with the plan and demonstrated an understanding of the instructions.   The patient was advised to call back or seek an in-person evaluation if the symptoms worsen or if the condition fails to improve as anticipated.  I provided 12 minutes of non-face-to-face time during this encounter.  Additional 7 minutes to place orders, complete today's visit note.  Cain Saupe, MD

## 2020-03-21 MED FILL — MONTELUKAST SOD 10 MG TAB: 10 | 30 days supply | Qty: 30 | Fill #2

## 2020-03-21 MED FILL — !DULERA 100 MCG/5 MCG INH: 100-5 | 30 days supply | Qty: 13 | Fill #2

## 2020-07-01 ENCOUNTER — Ambulatory Visit: Payer: Self-pay | Admitting: Family Medicine

## 2020-08-04 ENCOUNTER — Ambulatory Visit
Admission: EM | Admit: 2020-08-04 | Discharge: 2020-08-04 | Disposition: A | Discharge status: Home / Self Care | Payer: Self-pay | Attending: Family Medicine | Admitting: Family Medicine

## 2020-08-04 ENCOUNTER — Other Ambulatory Visit: Payer: Self-pay

## 2020-08-04 DIAGNOSIS — K047 Periapical abscess without sinus: Secondary | ICD-10-CM

## 2020-08-04 DIAGNOSIS — J4540 Moderate persistent asthma, uncomplicated: Secondary | ICD-10-CM

## 2020-08-04 DIAGNOSIS — J45909 Unspecified asthma, uncomplicated: Secondary | ICD-10-CM

## 2020-08-04 MED ORDER — AMOXICILLIN 875 MG PO TABS: 875.0000 mg | ORAL_TABLET | Freq: Two times a day (BID) | ORAL | 0 refills | Status: AC

## 2020-08-04 MED ORDER — DULERA 100-5 MCG/ACT IN AERO: 2.0000 | INHALATION_SPRAY | Freq: Two times a day (BID) | RESPIRATORY_TRACT | 0 refills | Status: DC

## 2020-08-04 MED ORDER — PREDNISONE 20 MG PO TABS: 20.0000 mg | ORAL_TABLET | Freq: Every day | ORAL | 0 refills | Status: AC

## 2020-08-04 MED ORDER — ALBUTEROL SULFATE HFA 108 (90 BASE) MCG/ACT IN AERS: 1.0000 | INHALATION_SPRAY | Freq: Four times a day (QID) | RESPIRATORY_TRACT | 0 refills | Status: DC | PRN

## 2020-08-04 MED ORDER — LIDOCAINE VISCOUS HCL 2 % MT SOLN: 15.0000 mL | OROMUCOSAL | 0 refills | Status: DC | PRN

## 2020-08-04 NOTE — ED Provider Notes (Signed)
[name]    CSN: [id number] Arrival date & time: 08/04/20  1511      History   Chief Complaint Chief Complaint  Patient presents with  . Dental Pain  . Asthma    HPI Julie Cardenas is a 35 y.o. female.   HPI  Asthma, patient requests a medication refill of maintenance inhaler Dulera. She reports missing her PCP appointment, therefore has been unable to fill medication. She has had intermittent symptoms of wheezing and shortness of breath for over one week.     Dental pain, patient has an impacted right molar tooth. Patient has an upcoming dentist appointment however, experiencing right facial pain and swelling. Ibuprofen for pain without relief.  Past Medical History:  Diagnosis Date  . Anxiety    no medicine, prayers aid her   . Asthma   . Bronchitis   . Depression   . Family history of anesthesia complication    mother slow waking up  . Headache(784.0)    sinus related  . Leg cramps    PCP- advised her to exercise   . Seasonal allergies   . Shortness of breath     Patient Active Problem List   Diagnosis Date Noted  . Major depressive disorder, recurrent episode, moderate (HCC) 12/05/2015  . MDD (major depressive disorder), recurrent severe, 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/2015  . Major depressive disorder, recurrent episode, moderate degree (HCC) 12/05/2015  . Depression 10/04/2014  . Generalized anxiety disorder 10/04/2014  . Nasal polyps 09/13/2013  . Sinusitis, chronic 09/13/2013    Past Surgical History:  Procedure Laterality Date  . SINUS ENDO W/FUSION Right 09/13/2013   Procedure: RIGHT ENDOSCOPIC SINUS SURGERY WITH FUSION SCAN;  Surgeon: Osborn Coho, MD;  Location: [facility] OR;  Service: ENT;  Laterality: Right;  . SINUS ENDO W/FUSION Left 02/01/2014   Procedure: LEFT ENDOSCOPIC SINUS SURGERY/NASAL POLYPECTOMY WITH FUSION;  Surgeon: Osborn Coho, MD;  Location: [facility] OR;  Service: ENT;  Laterality: Left;  . sinus polyps removed      OB  History    Gravida  1   Para      Term      Preterm      AB      Living        SAB      IAB      Ectopic      Multiple      Live Births               Home Medications    Prior to Admission medications   Medication Sig Start Date End Date Taking? Authorizing Provider  albuterol (PROVENTIL) (2.5 MG/3ML) 0.083% nebulizer solution Take 3 mLs (2.5 mg total) by nebulization every 6 (six) hours as needed for wheezing or shortness of breath. Patient not taking: Reported on 12/11/2019 11/14/19   Belinda Fisher, PA-C  albuterol (VENTOLIN HFA) 108 (90 Base) MCG/ACT inhaler Inhale 1-2 puffs into the lungs every 6 (six) hours as needed for wheezing or shortness of breath. 01/23/20   Hoy Register, MD  azithromycin (ZITHROMAX) 250 MG tablet Take 2 pills today then one pill daily for 4 days 02/08/20   Fulp, Cammie, MD  cetirizine (ZYRTEC ALLERGY) 10 MG tablet Take 1 tablet (10 mg total) by mouth daily. 02/19/19 03/21/19  Jeannie Fend, PA-C  famotidine (PEPCID) 20 MG tablet Take 1 tablet (20 mg total) by mouth 2 (two) times daily. To reduce stomach acid 02/08/20 03/09/20  Cain Saupe, MD  fluticasone (FLONASE) 50 MCG/ACT nasal spray Place 1 spray into both nostrils daily. 02/19/19   Jeannie Fend, PA-C  ipratropium-albuterol (DUONEB) 0.5-2.5 (3) MG/3ML SOLN Take 3 mLs by nebulization every 6 (six) hours as needed. 11/14/19   Cathie Hoops, Amy V, PA-C  mometasone-formoterol (DULERA) 100-5 MCG/ACT AERO Inhale 2 puffs into the lungs in the morning and at bedtime. 12/11/19   Hoy Register, MD  montelukast (SINGULAIR) 10 MG tablet Take 1 tablet (10 mg total) by mouth at bedtime. 12/11/19 01/10/20  Hoy Register, MD  ondansetron (ZOFRAN) 4 MG tablet Take 1 tablet (4 mg total) by mouth every 8 (eight) hours as needed for nausea or vomiting. 02/08/20   Fulp, Cammie, MD  predniSONE (DELTASONE) 20 MG tablet 3 pills today then 2 pills daily x 2 days, 1 pill daily x 2 days then 1/2 pill daily for 4 days for  wheezing/chest tightness 02/08/20   Cain Saupe, MD    Family History Family History  Problem Relation Age of Onset  . Diabetes Mother   . Cancer Mother   . Hypertension Mother     Social History Social History   Tobacco Use  . Smoking status: Never Smoker  . Smokeless tobacco: Never Used  Vaping Use  . Vaping Use: Never used  Substance Use Topics  . Alcohol use: Yes    Comment: occas.  . Drug use: No     Allergies   Naproxen   Review of Systems Review of Systems Pertinent negatives listed in HPI   Physical Exam Triage Vital Signs ED Triage Vitals  Enc Vitals Group     BP 08/04/20 1659 104/72     Pulse Rate 08/04/20 1659 79     Resp 08/04/20 1659 19     Temp 08/04/20 1659 98 F (36.7 C)     Temp src --      SpO2 08/04/20 1659 98 %     Weight --      Height --      Head Circumference --      Peak Flow --      Pain Score 08/04/20 1658 7     Pain Loc --      Pain Edu? --      Excl. in GC? --    No data found.  Updated Vital Signs BP 104/72   Pulse 79   Temp 98 F (36.7 C)   Resp 19   LMP 07/18/2020   SpO2 98%   Visual Acuity Right Eye Distance:   Left Eye Distance:   Bilateral Distance:    Right Eye Near:   Left Eye Near:    Bilateral Near:     Physical Exam Constitutional: Patient appears obese, alert, no distress  distress. HENT: Normocephalic, atraumatic, External right and left ear normal.   Eyes: Conjunctivae and EOM are normal. PERRLA, no scleral icterus. Neck: Normal ROM. Neck supple. No JVD. No tracheal deviation. No thyromegaly. CVS: RRR, S1/S2 +, no murmurs, no gallops, no carotid bruit.  Pulmonary: Effort and breath sounds normal, no stridor, rhonchi, wheezes, rales.  Musculoskeletal: Normal range of motion. No edema and no tenderness.  Neuro: Alert. Normal reflexes, muscle tone coordination. No cranial nerve deficit. Skin: Skin is warm and dry. No rash noted. Not diaphoretic. No erythema. No pallor. Psychiatric: Normal mood  and affect. Behavior, judgment, thought content normal.  UC Treatments / Results  Labs (all labs ordered are listed, but only abnormal results are displayed) Labs Reviewed - No data  to display  EKG   Radiology No results found.  Procedures Procedures (including critical care time)  Medications Ordered in UC Medications - No data to display  Initial Impression / Assessment and Plan / UC Course  I have reviewed the triage vital signs and the nursing notes.  Pertinent labs & imaging results that were available during my care of the patient were reviewed by me and considered in my medical decision making (see chart for details).     Asthma-asymptomatic , medication refill, albuterol and Dulera.  Follow-up with PCP.  Dental infection, facial swelling and visible gingival inflammation and swelling. Concern for abscess formation, covering for infection with amoxicillin. Prednisone for facial swelling. Xylocaine PRN for pain. Red flags precautions discussed. Follow-up with dentist and PCP as discussed.   Final Clinical Impressions(s) / UC Diagnoses   Final diagnoses:  Chronic asthma, unspecified asthma severity, unspecified whether complicated, unspecified whether persistent  Dental infection   Discharge Instructions   None    ED Prescriptions    Medication Sig Dispense Auth. Provider   mometasone-formoterol (DULERA) 100-5 MCG/ACT AERO Inhale 2 puffs into the lungs in the morning and at bedtime. 13 g Bing Neighbors, FNP   albuterol (VENTOLIN HFA) 108 (90 Base) MCG/ACT inhaler Inhale 1-2 puffs into the lungs every 6 (six) hours as needed for wheezing or shortness of breath. 8.5 g Bing Neighbors, FNP   predniSONE (DELTASONE) 20 MG tablet Take 1 tablet (20 mg total) by mouth daily with breakfast for 5 days. 5 tablet Bing Neighbors, FNP   amoxicillin (AMOXIL) 875 MG tablet Take 1 tablet (875 mg total) by mouth 2 (two) times daily for 7 days. 14 tablet Bing Neighbors,  FNP   lidocaine (XYLOCAINE) 2 % solution Use as directed 15 mLs in the mouth or throat every 3 (three) hours as needed for mouth pain (mix with warm gargle and spit). 100 mL Bing Neighbors, FNP     PDMP not reviewed this encounter.   Bing Neighbors, FNP 08/05/20 2106

## 2020-08-04 NOTE — ED Triage Notes (Signed)
Pt presents with right sided dental pain. She has an appt with a dentist but needs something for pain and possibly antibiotics.  Pt also ran out of her maintenance asthma medication dulera and is in need of a refill.

## 2020-08-25 ENCOUNTER — Ambulatory Visit
Admission: EM | Admit: 2020-08-25 | Discharge: 2020-08-25 | Disposition: A | Discharge status: Home / Self Care | Payer: Self-pay | Attending: Emergency Medicine | Admitting: Emergency Medicine

## 2020-08-25 ENCOUNTER — Other Ambulatory Visit: Payer: Self-pay

## 2020-08-25 ENCOUNTER — Encounter: Payer: Self-pay | Admitting: Emergency Medicine

## 2020-08-25 DIAGNOSIS — M25512 Pain in left shoulder: Secondary | ICD-10-CM

## 2020-08-25 DIAGNOSIS — G44319 Acute post-traumatic headache, not intractable: Secondary | ICD-10-CM

## 2020-08-25 MED ORDER — TIZANIDINE HCL 2 MG PO TABS: 2.0000 mg | ORAL_TABLET | Freq: Every day | ORAL | 0 refills | Status: DC

## 2020-08-25 MED ORDER — IBUPROFEN 800 MG PO TABS: 800.0000 mg | ORAL_TABLET | Freq: Three times a day (TID) | ORAL | 0 refills | Status: DC

## 2020-08-25 NOTE — Discharge Instructions (Signed)
Limit screen time and heavy focused/ concentration activities until headache improves.  Light stretching and ROM of neck and left shoulder.  Ibuprofen three times a day as needed for pain.  Tizanidine as a muscle relaxer as needed before bed.  If symptoms worsen or do not improve in the next week to return to be seen or to follow up with PCP.   If worsening headache, vision changes, vomiting, or otherwise worsening please go to the ER.
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
## 2020-08-31 ENCOUNTER — Other Ambulatory Visit: Payer: Self-pay

## 2020-08-31 MED ORDER — DULERA 100-5 MCG/ACT IN AERO: 2.0000 | INHALATION_SPRAY | RESPIRATORY_TRACT | 3 refills | Status: DC

## 2020-11-20 ENCOUNTER — Other Ambulatory Visit: Payer: Self-pay

## 2020-11-20 ENCOUNTER — Ambulatory Visit
Admission: EM | Admit: 2020-11-20 | Discharge: 2020-11-20 | Disposition: A | Discharge status: Home / Self Care | Payer: Self-pay | Attending: Student | Admitting: Student

## 2020-11-20 DIAGNOSIS — J4541 Moderate persistent asthma with (acute) exacerbation: Secondary | ICD-10-CM

## 2020-11-20 DIAGNOSIS — J301 Allergic rhinitis due to pollen: Secondary | ICD-10-CM

## 2020-11-20 MED ORDER — IPRATROPIUM-ALBUTEROL 0.5-2.5 (3) MG/3ML IN SOLN: 3.0000 mL | Freq: Four times a day (QID) | RESPIRATORY_TRACT | 2 refills | Status: DC | PRN

## 2020-11-20 MED ORDER — PREDNISONE 10 MG (21) PO TBPK: ORAL_TABLET | Freq: Every day | ORAL | 0 refills | Status: DC

## 2020-11-20 NOTE — ED Provider Notes (Signed)
[name]    CSN: [id number] Arrival date & time: 11/20/20  0810      History   Chief Complaint Chief Complaint  Patient presents with   Shortness of Breath   Asthma    HPI Julie Cardenas is a 35 y.o. female presenting with asthma exacerbation. Medical history asthma, bronchitis, allergic rhinitis, MDD, chronic sinusitis.  Notes 1 month of progressively worsening hacking cough, worse at bedtime.  Productive of yellow and green sputum.  Dyspnea on exertion.  bilateral ear pressure, denies pain, muffled hearing, dizzines, tinnitus. Denies recent URI, but states she does have allergic rhinitis that is currently poorly controlled on no medications. Denies fevers/chills, n/v/d, chest pain, facial pain, teeth pain, headaches, sore throat, loss of taste/smell, swollen lymph nodes.    HPI  Past Medical History:  Diagnosis Date   Anxiety    no medicine, prayers aid her    Asthma    Bronchitis    Depression    Family history of anesthesia complication    mother slow waking up   Headache(784.0)    sinus related   Leg cramps    PCP- advised her to exercise    Seasonal allergies    Shortness of breath     Patient Active Problem List   Diagnosis Date Noted   Major depressive disorder, recurrent episode, moderate (HCC) 12/05/2015   MDD (major depressive disorder), recurrent severe, 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/2015   Major depressive disorder, recurrent episode, moderate degree (HCC) 12/05/2015   Depression 10/04/2014   Generalized anxiety disorder 10/04/2014   Nasal polyps 09/13/2013   Sinusitis, chronic 09/13/2013    Past Surgical History:  Procedure Laterality Date   SINUS ENDO W/FUSION Right 09/13/2013   Procedure: RIGHT ENDOSCOPIC SINUS SURGERY WITH FUSION SCAN;  Surgeon: Osborn Coho, MD;  Location: [facility] OR;  Service: ENT;  Laterality: Right;   SINUS ENDO W/FUSION Left 02/01/2014   Procedure: LEFT ENDOSCOPIC SINUS SURGERY/NASAL POLYPECTOMY WITH FUSION;   Surgeon: Osborn Coho, MD;  Location: [facility] OR;  Service: ENT;  Laterality: Left;   sinus polyps removed      OB History     Gravida  1   Para      Term      Preterm      AB      Living         SAB      IAB      Ectopic      Multiple      Live Births               Home Medications    Prior to Admission medications   Medication Sig Start Date End Date Taking? Authorizing Provider  predniSONE (STERAPRED UNI-PAK 21 TAB) 10 MG (21) TBPK tablet Take by mouth daily. Take 6 tabs by mouth daily  for 2 days, then 5 tabs for 2 days, then 4 tabs for 2 days, then 3 tabs for 2 days, 2 tabs for 2 days, then 1 tab by mouth daily for 2 days 11/20/20  Yes Cheree Ditto, Lyman Speller, PA-C  albuterol (VENTOLIN HFA) 108 (90 Base) MCG/ACT inhaler Inhale 1-2 puffs into the lungs every 6 (six) hours as needed for wheezing or shortness of breath. 08/04/20   Bing Neighbors, FNP  azithromycin (ZITHROMAX) 250 MG tablet Take 2 pills today then one pill daily for 4 days 02/08/20   Fulp, Hewitt Shorts, MD  cetirizine (ZYRTEC ALLERGY) 10 MG tablet Take 1 tablet (10  mg total) by mouth daily. 02/19/19 03/21/19  Jeannie FendMurphy, Shaili Donalson A, PA-C  famotidine (PEPCID) 20 MG tablet Take 1 tablet (20 mg total) by mouth 2 (two) times daily. To reduce stomach acid 02/08/20 03/09/20  Fulp, Cammie, MD  fluticasone (FLONASE) 50 MCG/ACT nasal spray Place 1 spray into both nostrils daily. 02/19/19   Jeannie FendMurphy, Shawanna Zanders A, PA-C  ibuprofen (ADVIL) 800 MG tablet Take 1 tablet (800 mg total) by mouth 3 (three) times daily. 08/25/20   Linus MakoBurky, Natalie B, NP  ipratropium-albuterol (DUONEB) 0.5-2.5 (3) MG/3ML SOLN Take 3 mLs by nebulization every 6 (six) hours as needed. 11/20/20   Rhys MartiniGraham, Emmalin Jaquess E, PA-C  lidocaine (XYLOCAINE) 2 % solution Use as directed 15 mLs in the mouth or throat every 3 (three) hours as needed for mouth pain (mix with warm gargle and spit). 08/04/20   Bing NeighborsHarris, Kimberly S, FNP  mometasone-formoterol (DULERA) 100-5 MCG/ACT AERO Inhale 2 puffs  into the lungs in the morning and at bedtime. 08/04/20   Bing NeighborsHarris, Kimberly S, FNP  mometasone-formoterol [facility](DULERA) 100-5 MCG/ACT AERO inhale 2 puffs into the lungs in the morning and at bedtime 12/11/19 12/10/20  Hoy RegisterNewlin, Enobong, MD  montelukast (SINGULAIR) 10 MG tablet TAKE 1 TABLET (10 MG TOTAL) BY MOUTH AT BEDTIME. 12/11/19 12/10/20  Hoy RegisterNewlin, Enobong, MD  ondansetron (ZOFRAN) 4 MG tablet Take 1 tablet (4 mg total) by mouth every 8 (eight) hours as needed for nausea or vomiting. 02/08/20   Fulp, Cammie, MD  tiZANidine (ZANAFLEX) 2 MG tablet Take 1 tablet (2 mg total) by mouth at bedtime. 08/25/20   Georgetta HaberBurky, Natalie B, NP    Family History Family History  Problem Relation Age of Onset   Diabetes Mother    Cancer Mother    Hypertension Mother     Social History Social History   Tobacco Use   Smoking status: Never   Smokeless tobacco: Never  Vaping Use   Vaping Use: Never used  Substance Use Topics   Alcohol use: Yes    Comment: occas.   Drug use: No     Allergies   Naproxen   Review of Systems Review of Systems  Constitutional:  Negative for appetite change, chills and fever.  HENT:  Positive for congestion. Negative for ear pain, rhinorrhea, sinus pressure, sinus pain and sore throat.   Eyes:  Negative for redness and visual disturbance.  Respiratory:  Positive for cough, shortness of breath and wheezing. Negative for chest tightness.   Cardiovascular:  Negative for chest pain and palpitations.  Gastrointestinal:  Negative for abdominal pain, constipation, diarrhea, nausea and vomiting.  Genitourinary:  Negative for dysuria, frequency and urgency.  Musculoskeletal:  Negative for myalgias.  Neurological:  Negative for dizziness, weakness and headaches.  Psychiatric/Behavioral:  Negative for confusion.   All other systems reviewed and are negative.   Physical Exam Triage Vital Signs ED Triage Vitals  Enc Vitals Group     BP 11/20/20 0824 130/87     Pulse Rate 11/20/20 0824 87      Resp 11/20/20 0824 16     Temp 11/20/20 0824 97.9 F (36.6 C)     Temp Source 11/20/20 0824 Oral     SpO2 11/20/20 0824 99 %     Weight --      Height --      Head Circumference --      Peak Flow --      Pain Score 11/20/20 0827 0     Pain Loc --  Pain Edu? --      Excl. in GC? --    No data found.  Updated Vital Signs BP 130/87 (BP Location: Left Arm)   Pulse 87   Temp 97.9 F (36.6 C) (Oral)   Resp 16   LMP 10/29/2020   SpO2 99%   Visual Acuity Right Eye Distance:   Left Eye Distance:   Bilateral Distance:    Right Eye Near:   Left Eye Near:    Bilateral Near:     Physical Exam Vitals reviewed.  Constitutional:      General: She is not in acute distress.    Appearance: Normal appearance. She is not ill-appearing.  HENT:     Head: Normocephalic and atraumatic.     Right Ear: Hearing, ear canal and external ear normal. No swelling or tenderness. A middle ear effusion is present. There is no impacted cerumen. No mastoid tenderness. Tympanic membrane is not perforated, erythematous, retracted or bulging.     Left Ear: Hearing, ear canal and external ear normal. No swelling or tenderness. A middle ear effusion is present. There is no impacted cerumen. No mastoid tenderness. Tympanic membrane is not perforated, erythematous, retracted or bulging.     Nose:     Right Sinus: No maxillary sinus tenderness or frontal sinus tenderness.     Left Sinus: No maxillary sinus tenderness or frontal sinus tenderness.     Mouth/Throat:     Mouth: Mucous membranes are moist.     Pharynx: Uvula midline. No oropharyngeal exudate or posterior oropharyngeal erythema.     Tonsils: No tonsillar exudate.  Cardiovascular:     Rate and Rhythm: Normal rate and regular rhythm.     Heart sounds: Normal heart sounds.  Pulmonary:     Effort: No tachypnea, bradypnea or accessory muscle usage.     Breath sounds: Normal air entry. Wheezing present. No decreased breath sounds, rhonchi or  rales.     Comments: Expiratory wheezes throughout Chest:     Chest wall: No tenderness.  Abdominal:     General: Abdomen is flat. Bowel sounds are normal.     Tenderness: There is no abdominal tenderness. There is no guarding or rebound.  Lymphadenopathy:     Cervical: No cervical adenopathy.  Neurological:     General: No focal deficit present.     Mental Status: She is alert and oriented to person, place, and time.  Psychiatric:        Attention and Perception: Attention and perception normal.        Mood and Affect: Mood and affect normal.        Behavior: Behavior normal. Behavior is cooperative.        Thought Content: Thought content normal.        Judgment: Judgment normal.     UC Treatments / Results  Labs (all labs ordered are listed, but only abnormal results are displayed) Labs Reviewed - No data to display  EKG   Radiology No results found.  Procedures Procedures (including critical care time)  Medications Ordered in UC Medications - No data to display  Initial Impression / Assessment and Plan / UC Course  I have reviewed the triage vital signs and the nursing notes.  Pertinent labs & imaging results that were available during my care of the patient were reviewed by me and considered in my medical decision making (see chart for details).     This patient is a very pleasant 35 y.o. year old  female presenting with acute asthma exacerbation related to untreated allergic rhinitis.  Afebrile, nontachycardic, expiratory wheezes throughout.  Prednisone taper as below, she is not a diabetic.  Also refilled nebulizer solution.  Restart over-the-counter antihistamine.  Coding this visit a level 4 for chronic condition with acute exacerbation and prescription drug management.  Final Clinical Impressions(s) / UC Diagnoses   Final diagnoses:  Moderate persistent asthma with acute exacerbation  Seasonal allergic rhinitis due to pollen     Discharge  Instructions      -Prednisone taper for cough/bronchitis. I recommend taking this in the morning as it could give you energy.  Avoid NSAIDs like ibuprofen and alleve while taking this medication as they can increase your risk of stomach upset and even GI bleeding when in combination with a steroid. You can continue tylenol (acetaminophen) up to 1000mg  3x daily. -Restart the benedryl for allergy component -I refilled your nebulizer solution -Try to pick up the steroid inhaler when you can  -Seek additional medical attention if your symptoms worsen, like shortness of breath, new fever/chills, dizziness, chest pain.     ED Prescriptions     Medication Sig Dispense Auth. Provider   ipratropium-albuterol (DUONEB) 0.5-2.5 (3) MG/3ML SOLN Take 3 mLs by nebulization every 6 (six) hours as needed. 90 mL , PA-C   predniSONE (STERAPRED UNI-PAK 21 TAB) 10 MG (21) TBPK tablet Take by mouth daily. Take 6 tabs by mouth daily  for 2 days, then 5 tabs for 2 days, then 4 tabs for 2 days, then 3 tabs for 2 days, 2 tabs for 2 days, then 1 tab by mouth daily for 2 days 42 tablet Rhys Martini, PA-C      PDMP not reviewed this encounter.   Rhys Martini, PA-C 11/20/20 [id number]

## 2020-11-20 NOTE — ED Triage Notes (Signed)
Pt present SOB with coughing. Pt states that she has a history of asthma and it has been giving her issue for a week but last night symptom got worst. Pt states she is coughing up thick, dark yellow mucus.

## 2020-11-20 NOTE — Discharge Instructions (Addendum)
-  Prednisone taper for cough/bronchitis. I recommend taking this in the morning as it could give you energy.  Avoid NSAIDs like ibuprofen and alleve while taking this medication as they can increase your risk of stomach upset and even GI bleeding when in combination with a steroid. You can continue tylenol (acetaminophen) up to 1000mg  3x daily. -Restart the benedryl for allergy component -I refilled your nebulizer solution -Try to pick up the steroid inhaler when you can  -Seek additional medical attention if your symptoms worsen, like shortness of breath, new fever/chills, dizziness, chest pain.

## 2021-02-03 ENCOUNTER — Ambulatory Visit
Admission: EM | Admit: 2021-02-03 | Discharge: 2021-02-03 | Disposition: A | Discharge status: Home / Self Care | Payer: Self-pay | Attending: Internal Medicine | Admitting: Internal Medicine

## 2021-02-03 ENCOUNTER — Other Ambulatory Visit: Payer: Self-pay

## 2021-02-03 DIAGNOSIS — R0602 Shortness of breath: Secondary | ICD-10-CM

## 2021-02-03 DIAGNOSIS — J4521 Mild intermittent asthma with (acute) exacerbation: Secondary | ICD-10-CM

## 2021-02-03 MED ORDER — PREDNISONE 10 MG (21) PO TBPK: ORAL_TABLET | Freq: Every day | ORAL | 0 refills | Status: DC

## 2021-02-03 MED ORDER — ALBUTEROL SULFATE HFA 108 (90 BASE) MCG/ACT IN AERS: 1.0000 | INHALATION_SPRAY | Freq: Four times a day (QID) | RESPIRATORY_TRACT | 0 refills | Status: DC | PRN

## 2021-02-03 MED ORDER — CETIRIZINE HCL 10 MG PO TABS: 10.0000 mg | ORAL_TABLET | Freq: Every day | ORAL | 0 refills | Status: AC

## 2021-02-03 NOTE — Discharge Instructions (Addendum)
You have been prescribed prednisone steroid, cetirizine, albuterol inhaler to help with your asthma exacerbation.  Please go to the hospital if shortness of breath worsens.  Follow-up with your primary care as well.

## 2021-02-03 NOTE — ED Triage Notes (Signed)
Pt c/o dyspnea, cough, thick yellow mucous, worsening asthma sx onset approx 1.5 weeks ago states she has been using an old inhaler and neb treatments at home. The nebs give approx 4 hours of relief then symptoms return. States has also been taking children's benadryl without relief. States this has happened before and prednisone provides "the most relief."   States she cannot afford the maintenance drugs, neither Breo nor Dulera.

## 2021-02-03 NOTE — ED Provider Notes (Signed)
[name]    CSN: [id number] Arrival date & time: 02/03/21  0820      History   Chief Complaint Chief Complaint  Patient presents with   Shortness of Breath    HPI Julie Cardenas is a 35 y.o. female.   Patient presents with concerns for asthma exacerbation as she has been experiencing shortness of breath and productive cough with clear to yellow mucus.  Shortness of breath mainly occurs with exertion.  Denies any upper respiratory symptoms but states that she did take an at home COVID-19 test that was negative yesterday.  Symptoms started approximately 1.5 weeks ago.  Has been using albuterol inhaler and nebulizer treatments with minimal improvement in symptoms.  Patient states that she has run out of her albuterol inhaler and it is expired.  Has also been taking Benadryl over-the-counter daily.  Has been prescribed Breo and Dulera maintenance inhalers, but patient does not have insurance and has not been unable to afford these medications at this time.  Has not also not been able to see her PCP recently due to no appointment availability.   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/2015   Major depressive disorder, recurrent episode, moderate degree (HCC) 12/05/2015   Depression 10/04/2014   Generalized anxiety disorder 10/04/2014   Nasal polyps 09/13/2013   Sinusitis, chronic 09/13/2013    Past Surgical History:  Procedure Laterality Date   SINUS ENDO  W/FUSION Right 09/13/2013   Procedure: RIGHT ENDOSCOPIC SINUS SURGERY WITH FUSION SCAN;  Surgeon: Osborn Coho, MD;  Location: [facility] OR;  Service: ENT;  Laterality: Right;   SINUS ENDO W/FUSION Left 02/01/2014   Procedure: LEFT ENDOSCOPIC SINUS SURGERY/NASAL POLYPECTOMY WITH FUSION;  Surgeon: Osborn Coho, MD;  Location: [facility] OR;  Service: ENT;  Laterality: Left;   sinus polyps removed      OB History     Gravida  1   Para      Term      Preterm      AB      Living         SAB      IAB      Ectopic      Multiple      Live Births               Home Medications    Prior to Admission medications   Medication Sig Start Date End Date Taking? Authorizing Provider  albuterol (VENTOLIN HFA) 108 (90 Base) MCG/ACT inhaler Inhale 1-2 puffs into the lungs every 6 (six) hours as needed for wheezing or shortness of breath. 02/03/21  Yes Lance Muss, FNP  cetirizine (ZYRTEC) 10 MG tablet Take 1 tablet (10 mg total) by mouth daily. 02/03/21  Yes Lance Muss, FNP  predniSONE (STERAPRED UNI-PAK 21 TAB) 10 MG (21) TBPK tablet Take by mouth daily. Take 6  tabs by mouth daily  for 2 days, then 5 tabs for 2 days, then 4 tabs for 2 days, then 3 tabs for 2 days, 2 tabs for 2 days, then 1 tab by mouth daily for 2 days 02/03/21  Yes Lance MussFowler, Geraldy Akridge E, FNP  azithromycin (ZITHROMAX) 250 MG tablet Take 2 pills today then one pill daily for 4 days 02/08/20   Fulp, Cammie, MD  famotidine (PEPCID) 20 MG tablet Take 1 tablet (20 mg total) by mouth 2 (two) times daily. To reduce stomach acid 02/08/20 03/09/20  Fulp, Cammie, MD  fluticasone (FLONASE) 50 MCG/ACT nasal spray Place 1 spray into both nostrils daily. 02/19/19   Jeannie FendMurphy, Laura A, PA-C  ibuprofen (ADVIL) 800 MG tablet Take 1 tablet (800 mg total) by mouth 3 (three) times daily. 08/25/20   Linus MakoBurky, Natalie B, NP  ipratropium-albuterol (DUONEB) 0.5-2.5 (3) MG/3ML SOLN Take 3 mLs by nebulization every 6 (six) hours as needed. 11/20/20   Rhys MartiniGraham, Laura E,  PA-C  lidocaine (XYLOCAINE) 2 % solution Use as directed 15 mLs in the mouth or throat every 3 (three) hours as needed for mouth pain (mix with warm gargle and spit). 08/04/20   Bing NeighborsHarris, Kimberly S, FNP  mometasone-formoterol (DULERA) 100-5 MCG/ACT AERO Inhale 2 puffs into the lungs in the morning and at bedtime. 08/04/20   Bing NeighborsHarris, Kimberly S, FNP  mometasone-formoterol [facility](DULERA) 100-5 MCG/ACT AERO inhale 2 puffs into the lungs in the morning and at bedtime 12/11/19 12/10/20  Hoy RegisterNewlin, Enobong, MD  montelukast (SINGULAIR) 10 MG tablet TAKE 1 TABLET (10 MG TOTAL) BY MOUTH AT BEDTIME. 12/11/19 12/10/20  Hoy RegisterNewlin, Enobong, MD  ondansetron (ZOFRAN) 4 MG tablet Take 1 tablet (4 mg total) by mouth every 8 (eight) hours as needed for nausea or vomiting. 02/08/20   Fulp, Cammie, MD  tiZANidine (ZANAFLEX) 2 MG tablet Take 1 tablet (2 mg total) by mouth at bedtime. 08/25/20   Georgetta HaberBurky, Natalie B, NP    Family History Family History  Problem Relation Age of Onset   Diabetes Mother    Cancer Mother    Hypertension Mother     Social History Social History   Tobacco Use   Smoking status: Never   Smokeless tobacco: Never  Vaping Use   Vaping Use: Never used  Substance Use Topics   Alcohol use: Yes    Comment: occas.   Drug use: No     Allergies   Naproxen   Review of Systems Review of Systems Per HPI  Physical Exam Triage Vital Signs ED Triage Vitals [02/03/21 0937]  Enc Vitals Group     BP 119/65     Pulse Rate 85     Resp 18     Temp 98.3 F (36.8 C)     Temp Source Oral     SpO2 98 %     Weight      Height      Head Circumference      Peak Flow      Pain Score 0     Pain Loc      Pain Edu?      Excl. in [place]?    No data found.  Updated Vital Signs BP 119/65 (BP Location: Left Arm)   Pulse 85   Temp 98.3 F (36.8 C) (Oral)   Resp 18   LMP 01/19/2021 (Exact Date)   SpO2 98%   Visual Acuity Right Eye Distance:   Left Eye Distance:   Bilateral Distance:    Right  Eye Near:    Left Eye Near:    Bilateral Near:     Physical Exam Constitutional:      General: She is not in acute distress.    Appearance: Normal appearance. She is not ill-appearing, toxic-appearing or diaphoretic.  HENT:     Head: Normocephalic and atraumatic.     Right Ear: Tympanic membrane and ear canal normal.     Left Ear: Tympanic membrane and ear canal normal.     Nose: Nose normal.     Mouth/Throat:     Mouth: Mucous membranes are moist.     Pharynx: No posterior oropharyngeal erythema.  Eyes:     Extraocular Movements: Extraocular movements intact.     Conjunctiva/sclera: Conjunctivae normal.  Cardiovascular:     Rate and Rhythm: Normal rate and regular rhythm.     Pulses: Normal pulses.     Heart sounds: Normal heart sounds.  Pulmonary:     Effort: Pulmonary effort is normal. No respiratory distress.     Breath sounds: Wheezing present. No rhonchi.  Skin:    General: Skin is warm and dry.  Neurological:     General: No focal deficit present.     Mental Status: She is alert and oriented to person, place, and time. Mental status is at baseline.  Psychiatric:        Mood and Affect: Mood normal.        Behavior: Behavior normal.        Thought Content: Thought content normal.        Judgment: Judgment normal.     UC Treatments / Results  Labs (all labs ordered are listed, but only abnormal results are displayed) Labs Reviewed - No data to display  EKG   Radiology No results found.  Procedures Procedures (including critical care time)  Medications Ordered in UC Medications - No data to display  Initial Impression / Assessment and Plan / UC Course  I have reviewed the triage vital signs and the nursing notes.  Pertinent labs & imaging results that were available during my care of the patient were reviewed by me and considered in my medical decision making (see chart for details).     Patient's physical exam and clinical signs and symptoms are consistent with  an asthma exacerbation.  Will treat with prednisone steroid taper due to patient having successful treatment in the past with this.  Will refill albuterol inhaler as well.  Advised patient to go to the hospital if shortness of breath worsens.  Patient is nontoxic-appearing and does not appear to be in need immediate medical attention at the hospital at this time.  Patient advised to follow-up with PCP as soon as she can.Discussed strict return precautions. Patient verbalized understanding and is agreeable with plan.  Final Clinical Impressions(s) / UC Diagnoses   Final diagnoses:  Intermittent asthma with acute exacerbation, unspecified asthma severity  Shortness of breath     Discharge Instructions      You have been prescribed prednisone steroid, cetirizine, albuterol inhaler to help with your asthma exacerbation.  Please go to the hospital if shortness of breath worsens.  Follow-up with your primary care as well.     ED Prescriptions     Medication Sig Dispense Auth. Provider   predniSONE (STERAPRED UNI-PAK 21 TAB) 10 MG (21) TBPK tablet Take by mouth daily. Take 6 tabs by mouth daily  for 2 days, then 5 tabs for 2 days, then 4 tabs for 2 days, then  3 tabs for 2 days, 2 tabs for 2 days, then 1 tab by mouth daily for 2 days 42 tablet Lance Muss, FNP   cetirizine (ZYRTEC) 10 MG tablet Take 1 tablet (10 mg total) by mouth daily. 30 tablet Lance Muss, FNP   albuterol (VENTOLIN HFA) 108 (90 Base) MCG/ACT inhaler Inhale 1-2 puffs into the lungs every 6 (six) hours as needed for wheezing or shortness of breath. 1 each Lance Muss, FNP      PDMP not reviewed this encounter.   Lance Muss, FNP 02/03/21 1017

## 2021-02-11 ENCOUNTER — Telehealth: Payer: Self-pay | Admitting: Family Medicine

## 2021-02-11 ENCOUNTER — Telehealth: Payer: Self-pay | Admitting: Emergency Medicine

## 2021-02-11 NOTE — Telephone Encounter (Signed)
This medication was prescribed to her by urgent care.  If she is unable to tolerate it she can discontinue it.

## 2021-02-11 NOTE — Telephone Encounter (Signed)
Pt states she has been taking prednisone and she has been having different reaction on different days. One day she states that the roof of her mouth began to swell after taking the medication. She also states that she takes the medication and she can not sleep and she feel pressure in her face.

## 2021-02-11 NOTE — Telephone Encounter (Signed)
Copied from CRM [id number]. Topic: General - Call Back - No Documentation >> Feb 11, 2021 10:01 AM Louie Bun, Rosey Bath D wrote: Reason for CRM: Patient called and she couple of questions about her medication predniSONE (STERAPRED UNI-PAK 21 TAB) 10 MG (21) TBPK tablet. She would like a call back from her pcp cma, thanks.

## 2021-02-11 NOTE — Telephone Encounter (Signed)
Patient feels that she is having some side effects from the steroids she's been taken since 02/03/2021.  Patient has been taken the medication for one week and her asthma has approved but having other sx's.  Advised patient to either follow up here at urgent care or her PCP.  Patient will make an appointment to follow up here with urgent care.

## 2021-02-12 NOTE — Telephone Encounter (Signed)
Attempt to call patient. Left voicemail.

## 2021-02-12 NOTE — Telephone Encounter (Signed)
Patient aware to d/c medication per Dr. Alvis Lemmings

## 2021-02-12 NOTE — Telephone Encounter (Signed)
Pt calling to return call to nurse. Attempted to contact office x3 with no response. Please advise.

## 2021-03-10 ENCOUNTER — Encounter: Payer: Self-pay | Admitting: Family Medicine

## 2021-03-10 ENCOUNTER — Other Ambulatory Visit: Payer: Self-pay | Admitting: Family Medicine

## 2021-03-10 MED ORDER — ALBUTEROL SULFATE HFA 108 (90 BASE) MCG/ACT IN AERS: 1.0000 | INHALATION_SPRAY | Freq: Four times a day (QID) | RESPIRATORY_TRACT | 0 refills | Status: DC | PRN

## 2021-03-11 ENCOUNTER — Other Ambulatory Visit: Payer: Self-pay

## 2021-03-11 MED ORDER — DULERA 100-5 MCG/ACT IN AERO
2.0000 | INHALATION_SPRAY | RESPIRATORY_TRACT | 0 refills | Status: DC
  Filled 2021-03-11: qty 13, 30d supply, fill #0

## 2021-03-11 NOTE — Telephone Encounter (Signed)
Requested medications are due for refill today.  yes  Requested medications are on the active medications list.  yes  Last refill. 08/04/2020  Future visit scheduled.   no  Notes to clinic.  Pt last seen 01/23/2020.

## 2021-03-18 ENCOUNTER — Other Ambulatory Visit: Payer: Self-pay

## 2021-04-03 ENCOUNTER — Other Ambulatory Visit: Payer: Self-pay

## 2021-04-03 ENCOUNTER — Encounter (HOSPITAL_COMMUNITY): Payer: Self-pay

## 2021-04-03 ENCOUNTER — Ambulatory Visit (HOSPITAL_COMMUNITY)
Admission: EM | Admit: 2021-04-03 | Discharge: 2021-04-03 | Disposition: A | Discharge status: Home / Self Care | Payer: Self-pay | Attending: Student | Admitting: Student

## 2021-04-03 DIAGNOSIS — J0100 Acute maxillary sinusitis, unspecified: Secondary | ICD-10-CM

## 2021-04-03 DIAGNOSIS — J4541 Moderate persistent asthma with (acute) exacerbation: Secondary | ICD-10-CM

## 2021-04-03 MED ORDER — DEXAMETHASONE 6 MG PO TABS: 6.0000 mg | ORAL_TABLET | Freq: Every day | ORAL | 0 refills | Status: AC

## 2021-04-03 MED ORDER — AMOXICILLIN-POT CLAVULANATE 875-125 MG PO TABS: 1.0000 | ORAL_TABLET | Freq: Two times a day (BID) | ORAL | 0 refills | Status: DC

## 2021-04-03 MED ORDER — ALBUTEROL SULFATE HFA 108 (90 BASE) MCG/ACT IN AERS: 1.0000 | INHALATION_SPRAY | Freq: Four times a day (QID) | RESPIRATORY_TRACT | 0 refills | Status: AC | PRN

## 2021-04-03 NOTE — ED Provider Notes (Signed)
[place]    CSN: [id number] Arrival date & time: 04/03/21  [id number]      History   Chief Complaint Chief Complaint  Patient presents with   ASTHMA   Ear Fullness   Otalgia    HPI Julie Cardenas is a 35 y.o. female presenting with asthma exacerbation and bilateral ear pain x3 weeks. Medical history asthma. Describes 3 weeks of both symptoms. Initially asthma was relieved by albuterol but she is now out of this. Cough is productive of yellow sputum, denies SOB, fevers/chills. Prednisone works well for her asthma flares but states last time she had a reaction where her roof of her mouth felt painful and irritated. Denied any facial/lip/tongue/uvula swelling, SOB, sensation of throat closing, etc with this medicine.  She also describes 3 weeks of bilateral ear pressure right worse than left with some right-sided maxillary sinus tenderness.  HPI  Past Medical History:  Diagnosis Date   Anxiety    no medicine, prayers aid her    Asthma    Bronchitis    Depression    Family history of anesthesia complication    mother slow waking up   Headache(784.0)    sinus related   Leg cramps    PCP- advised her to exercise    Seasonal allergies    Shortness of breath     Patient Active Problem List   Diagnosis Date Noted   Major depressive disorder, recurrent episode, moderate ([place]) 12/05/2015   MDD (major depressive disorder), recurrent severe, without psychosis ([place]) 12/05/2015   Major depressive disorder, recurrent episode, moderate degree ([place]) 12/05/2015   Depression 10/04/2014   Generalized anxiety disorder 10/04/2014   Nasal polyps 09/13/2013   Sinusitis, chronic 09/13/2013    Past Surgical History:  Procedure Laterality Date   SINUS ENDO W/FUSION Right 09/13/2013   Procedure: RIGHT ENDOSCOPIC SINUS SURGERY WITH FUSION SCAN;  Surgeon: [name], MD;  Location: [facility] OR;  Service: ENT;  Laterality: Right;   SINUS ENDO W/FUSION Left 02/01/2014   Procedure: LEFT  ENDOSCOPIC SINUS SURGERY/NASAL POLYPECTOMY WITH FUSION;  Surgeon: [name], MD;  Location: [facility] OR;  Service: ENT;  Laterality: Left;   sinus polyps removed      OB History     Gravida  1   Para      Term      Preterm      AB      Living         SAB      IAB      Ectopic      Multiple      Live Births               Home Medications    Prior to Admission medications   Medication Sig Start Date End Date Taking? Authorizing Provider  albuterol (VENTOLIN HFA) 108 (90 Base) MCG/ACT inhaler Inhale 1-2 puffs into the lungs every 6 (six) hours as needed for wheezing or shortness of breath. 04/03/21  Yes Hazel Sams, PA-C  amoxicillin-clavulanate (AUGMENTIN) 875-125 MG tablet Take 1 tablet by mouth every 12 (twelve) hours. 04/03/21  Yes Hazel Sams, PA-C  dexamethasone (DECADRON) 6 MG tablet Take 1 tablet (6 mg total) by mouth daily with breakfast for 5 days. 04/03/21 04/08/21 Yes Hazel Sams, PA-C  azithromycin (ZITHROMAX) 250 MG tablet Take 2 pills today then one pill daily for 4 days 02/08/20   Fulp, Cammie, MD  cetirizine (ZYRTEC) 10 MG tablet Take 1 tablet (  10 mg total) by mouth daily. 02/03/21   Teodora Medici, FNP  famotidine (PEPCID) 20 MG tablet Take 1 tablet (20 mg total) by mouth 2 (two) times daily. To reduce stomach acid 02/08/20 03/09/20  Fulp, Cammie, MD  fluticasone (FLONASE) 50 MCG/ACT nasal spray Place 1 spray into both nostrils daily. 02/19/19   Tacy Learn, PA-C  ibuprofen (ADVIL) 800 MG tablet Take 1 tablet (800 mg total) by mouth 3 (three) times daily. 08/25/20   Augusto Gamble B, NP  ipratropium-albuterol (DUONEB) 0.5-2.5 (3) MG/3ML SOLN Take 3 mLs by nebulization every 6 (six) hours as needed. 11/20/20   Hazel Sams, PA-C  lidocaine (XYLOCAINE) 2 % solution Use as directed 15 mLs in the mouth or throat every 3 (three) hours as needed for mouth pain (mix with warm gargle and spit). 08/04/20   Scot Jun, FNP  mometasone-formoterol  [facility]) 100-5 MCG/ACT AERO inhale 2 puffs into the lungs in the morning and at bedtime 03/11/21 03/11/22  Charlott Rakes, MD  montelukast (SINGULAIR) 10 MG tablet TAKE 1 TABLET (10 MG TOTAL) BY MOUTH AT BEDTIME. 12/11/19 12/10/20  Charlott Rakes, MD  ondansetron (ZOFRAN) 4 MG tablet Take 1 tablet (4 mg total) by mouth every 8 (eight) hours as needed for nausea or vomiting. 02/08/20   Fulp, Cammie, MD  tiZANidine (ZANAFLEX) 2 MG tablet Take 1 tablet (2 mg total) by mouth at bedtime. 08/25/20   Zigmund Gottron, NP    Family History Family History  Problem Relation Age of Onset   Diabetes Mother    Cancer Mother    Hypertension Mother     Social History Social History   Tobacco Use   Smoking status: Never   Smokeless tobacco: Never  Vaping Use   Vaping Use: Never used  Substance Use Topics   Alcohol use: Yes    Comment: occas.   Drug use: No     Allergies   Naproxen and Prednisone   Review of Systems Review of Systems  Constitutional:  Negative for appetite change, chills and fever.  HENT:  Positive for ear pain and sinus pressure. Negative for congestion, rhinorrhea, sinus pain and sore throat.   Eyes:  Negative for redness and visual disturbance.  Respiratory:  Positive for cough. Negative for chest tightness, shortness of breath and wheezing.   Cardiovascular:  Negative for chest pain and palpitations.  Gastrointestinal:  Negative for abdominal pain, constipation, diarrhea, nausea and vomiting.  Genitourinary:  Negative for dysuria, frequency and urgency.  Musculoskeletal:  Negative for myalgias.  Neurological:  Negative for dizziness, weakness and headaches.  Psychiatric/Behavioral:  Negative for confusion.   All other systems reviewed and are negative.   Physical Exam Triage Vital Signs ED Triage Vitals  Enc Vitals Group     BP 04/03/21 1136 119/80     Pulse Rate 04/03/21 1136 (!) 103     Resp 04/03/21 1136 20     Temp 04/03/21 1136 98.2 F (36.8 C)     Temp  Source 04/03/21 1136 Oral     SpO2 04/03/21 1136 98 %     Weight --      Height --      Head Circumference --      Peak Flow --      Pain Score 04/03/21 1134 5     Pain Loc --      Pain Edu? --      Excl. in [place]? --    No data found.  Updated Vital Signs BP 119/80 (BP Location: Right Arm)   Pulse (!) 103   Temp 98.2 F (36.8 C) (Oral)   Resp 20   LMP 03/17/2021   SpO2 98%   Visual Acuity Right Eye Distance:   Left Eye Distance:   Bilateral Distance:    Right Eye Near:   Left Eye Near:    Bilateral Near:     Physical Exam Vitals reviewed.  Constitutional:      General: She is not in acute distress.    Appearance: Normal appearance. She is not ill-appearing.  HENT:     Head: Normocephalic and atraumatic.     Right Ear: Ear canal and external ear normal. No tenderness. A middle ear effusion is present. There is no impacted cerumen. Tympanic membrane is not perforated, erythematous, retracted or bulging.     Left Ear: Ear canal and external ear normal. No tenderness. A middle ear effusion is present. There is no impacted cerumen. Tympanic membrane is not perforated, erythematous, retracted or bulging.     Nose: No congestion.     Right Sinus: Maxillary sinus tenderness present.     Mouth/Throat:     Mouth: Mucous membranes are moist.     Pharynx: Uvula midline. No oropharyngeal exudate or posterior oropharyngeal erythema.  Eyes:     Extraocular Movements: Extraocular movements intact.     Pupils: Pupils are equal, round, and reactive to light.  Cardiovascular:     Rate and Rhythm: Normal rate and regular rhythm.     Heart sounds: Normal heart sounds.  Pulmonary:     Effort: Pulmonary effort is normal.     Breath sounds: Wheezing present. No decreased breath sounds, rhonchi or rales.     Comments: Expiratory wheezes throughout Abdominal:     Palpations: Abdomen is soft.     Tenderness: There is no abdominal tenderness. There is no guarding or rebound.   Lymphadenopathy:     Cervical: No cervical adenopathy.     Right cervical: No superficial cervical adenopathy.    Left cervical: No superficial cervical adenopathy.  Neurological:     General: No focal deficit present.     Mental Status: She is alert and oriented to person, place, and time.  Psychiatric:        Mood and Affect: Mood normal.        Behavior: Behavior normal.        Thought Content: Thought content normal.        Judgment: Judgment normal.     UC Treatments / Results  Labs (all labs ordered are listed, but only abnormal results are displayed) Labs Reviewed - No data to display  EKG   Radiology No results found.  Procedures Procedures (including critical care time)  Medications Ordered in UC Medications - No data to display  Initial Impression / Assessment and Plan / UC Course  I have reviewed the triage vital signs and the nursing notes.  Pertinent labs & imaging results that were available during my care of the patient were reviewed by me and considered in my medical decision making (see chart for details).     This patient is a very pleasant 35 y.o. year old female presenting with asthma exacerbation and maxillary sinusitis. Today this pt is afebrile nontachycardic nontachypneic, oxygenating well on room air, wheezes throughout but no rhonchi or rales. States she is not pregnant or breastfeeding.  Refilled albuterol. Given possible reaction to prednisone, will treat with decadron today. Also sent augmentin.  ED return precautions discussed. Patient verbalizes understanding and agreement.    Final Clinical Impressions(s) / UC Diagnoses   Final diagnoses:  Moderate persistent asthma with acute exacerbation  Acute non-recurrent maxillary sinusitis     Discharge Instructions      -Albuterol inhaler as needed for cough, wheezing, shortness of breath, 1 to 2 puffs every 6 hours as needed. -Decadron x5 days. Try taking this earlier in the day as  it can give you energy. Avoid NSAIDs like ibuprofen and alleve while taking this medication as they can increase your risk of stomach upset and even GI bleeding when in combination with a steroid. You can continue tylenol (acetaminophen) up to 1000mg  3x daily. -Start the antibiotic-Augmentin (amoxicillin-clavulanate), 1 pill every 12 hours for 7 days.  You can take this with food like with breakfast and dinner. -Seek immediate medical attention if you develop a reaction to the decadron, including rashes, facial swelling, shortness of breath, etc.    ED Prescriptions     Medication Sig Dispense Auth. Provider   dexamethasone (DECADRON) 6 MG tablet Take 1 tablet (6 mg total) by mouth daily with breakfast for 5 days. 5 tablet Hazel Sams, PA-C   albuterol (VENTOLIN HFA) 108 (90 Base) MCG/ACT inhaler Inhale 1-2 puffs into the lungs every 6 (six) hours as needed for wheezing or shortness of breath. 1 each Hazel Sams, PA-C   amoxicillin-clavulanate (AUGMENTIN) 875-125 MG tablet Take 1 tablet by mouth every 12 (twelve) hours. 14 tablet Hazel Sams, PA-C      PDMP not reviewed this encounter.   Hazel Sams, PA-C 04/03/21 1302

## 2021-04-03 NOTE — ED Triage Notes (Signed)
Pt presents with bilateral ear pain & fullness X 3 weeks; pt also complains of shortness of breath that is unrelieved with nebulizer treatment and she is out of rescue inhaler.

## 2021-04-03 NOTE — Discharge Instructions (Addendum)
-  Albuterol inhaler as needed for cough, wheezing, shortness of breath, 1 to 2 puffs every 6 hours as needed. -Decadron x5 days. Try taking this earlier in the day as it can give you energy. Avoid NSAIDs like ibuprofen and alleve while taking this medication as they can increase your risk of stomach upset and even GI bleeding when in combination with a steroid. You can continue tylenol (acetaminophen) up to 1000mg  3x daily. -Start the antibiotic-Augmentin (amoxicillin-clavulanate), 1 pill every 12 hours for 7 days.  You can take this with food like with breakfast and dinner. -Seek immediate medical attention if you develop a reaction to the decadron, including rashes, facial swelling, shortness of breath, etc.

## 2021-07-19 ENCOUNTER — Encounter: Payer: Self-pay | Admitting: Emergency Medicine

## 2021-07-19 ENCOUNTER — Ambulatory Visit
Admission: EM | Admit: 2021-07-19 | Discharge: 2021-07-19 | Disposition: A | Discharge status: Home / Self Care | Payer: Self-pay

## 2021-07-19 ENCOUNTER — Other Ambulatory Visit: Payer: Self-pay

## 2021-07-19 DIAGNOSIS — S99922A Unspecified injury of left foot, initial encounter: Secondary | ICD-10-CM

## 2021-07-19 NOTE — ED Provider Notes (Signed)
Patient here today for evaluation of continued left foot pain after injury yesterday. She was seen at another UC yesterday and had negative xrays. I wrapped her foot up in office but recommended further evaluation by ortho and discussed that from UC standpoint there was not much else I could offer. Patient expresses understanding and has appointment with PCP first thing tomorrow morning.    Francene Finders, PA-C 07/19/21 [phone]

## 2021-07-19 NOTE — ED Triage Notes (Signed)
Pt here for left foot pain after twisting yesterday; pt sts had xray yesterday; some swelling noted

## 2021-07-22 ENCOUNTER — Other Ambulatory Visit: Payer: Self-pay

## 2021-07-22 ENCOUNTER — Ambulatory Visit (INDEPENDENT_AMBULATORY_CARE_PROVIDER_SITE_OTHER): Payer: 59 | Admitting: Sports Medicine

## 2021-07-22 DIAGNOSIS — S93602A Unspecified sprain of left foot, initial encounter: Secondary | ICD-10-CM | POA: Diagnosis not present

## 2021-07-22 DIAGNOSIS — S92212A Displaced fracture of cuboid bone of left foot, initial encounter for closed fracture: Secondary | ICD-10-CM | POA: Insufficient documentation

## 2021-07-22 MED ORDER — TRAMADOL HCL 50 MG PO TABS: 50.0000 mg | ORAL_TABLET | Freq: Three times a day (TID) | ORAL | 0 refills | Status: DC | PRN

## 2021-07-22 NOTE — Assessment & Plan Note (Signed)
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 Had immediate pain, swelling, was seen in the ED, x-rays were negative for fracture. She is here for further evaluation. She did develop a small nodule dorsal midfoot. On exam the nodule feels like a ganglion cyst, it is well-defined and movable. She has tenderness over the dorsal midfoot, ankle is unremarkable. I do think she needs a postop shoe, I strapped up her foot again with a compressive dressing. Adding tramadol for pain, return to see me in 2 weeks at which point we will consider advanced imaging if she is not dramatically better.

## 2021-07-22 NOTE — Progress Notes (Signed)
° ° °  Procedures performed today:    None.  Independent interpretation of notes and tests performed by another provider:   None.  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 Had immediate pain, swelling, was seen in the ED, x-rays were negative for fracture. She is here for further evaluation. She did develop a small nodule dorsal midfoot. On exam the nodule feels like a ganglion cyst, it is well-defined and movable. She has tenderness over the dorsal midfoot, ankle is unremarkable. I do think she needs a postop shoe, I strapped up her foot again with a compressive dressing. Adding tramadol for pain, return to see me in 2 weeks at which point we will consider advanced imaging if she is not dramatically better.    ___________________________________________ Ihor Austin. [name], M.D., ABFM., CAQSM. Primary Care and Sports Medicine [facility]  Adjunct Instructor of Family Medicine  [employer]

## 2021-07-30 ENCOUNTER — Encounter: Payer: Self-pay | Admitting: Sports Medicine

## 2021-07-30 DIAGNOSIS — S93602D Unspecified sprain of left foot, subsequent encounter: Secondary | ICD-10-CM

## 2021-07-31 NOTE — Assessment & Plan Note (Signed)
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 ?She has a positive anterior drawer sign, swelling, tenderness at the ATFL, dorsal midfoot. ?Due to worsening pain in spite of conservative treatment we do need proceed with an MRI to further evaluate for an occult fracture versus ligamentous injury. ?Unable to bear weight. ?

## 2021-08-04 ENCOUNTER — Telehealth: Payer: Self-pay

## 2021-08-04 NOTE — Telephone Encounter (Signed)
Process for auth completed on 07/31/21. Patient's insurance requires that a completed insurance form be faxed to them. Fax confirmation was received. Awaiting for the decision whether plan will approve procedure, turnaround time is 24-72 business hours.  ?

## 2021-08-04 NOTE — Telephone Encounter (Signed)
Pt reports pain in left foot, with no relief of pain. pt states she is unable to bear weight on left foot, pt states she was prescribed tramadol for pain and has been using it intermittently with  motrin and has not had any relief . ?

## 2021-08-05 ENCOUNTER — Ambulatory Visit (INDEPENDENT_AMBULATORY_CARE_PROVIDER_SITE_OTHER): Payer: 59 | Admitting: Sports Medicine

## 2021-08-05 ENCOUNTER — Other Ambulatory Visit: Payer: Self-pay

## 2021-08-05 DIAGNOSIS — S92215D Nondisplaced fracture of cuboid bone of left foot, subsequent encounter for fracture with routine healing: Secondary | ICD-10-CM | POA: Diagnosis not present

## 2021-08-05 DIAGNOSIS — S93602D Unspecified sprain of left foot, subsequent encounter: Secondary | ICD-10-CM

## 2021-08-05 NOTE — Progress Notes (Addendum)
? ? ?  Procedures performed today:   ? ?None. ? ?Independent interpretation of notes and tests performed by another provider:  ? ?None. ? ?Brief History, Exam, Impression, and Recommendations:   ? 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 ?Continues to have significant pain, pain is predominantly over the cuboid and fifth metatarsal, no longer having pain over the ATFL. ?We have ordered an MRI, still awaiting this, I can refill her tramadol as needed, we added a lateral wedge in her postop shoe today, return to see me to go over MRI results. ? ?Update: MRI does confirm a nondisplaced fracture through the cuboid bone, not seen on x-rays.  Needs to be in a boot.  Expect 8 weeks at least for healing ? ? ? ?___________________________________________ ?Ihor Austin. [name], M.D., ABFM., CAQSM. ?Primary Care and Sports Medicine ?[facility] ? ?Adjunct Instructor of Family Medicine  ?[employer] ?

## 2021-08-05 NOTE — Assessment & Plan Note (Addendum)
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 ?Continues to have significant pain, pain is predominantly over the cuboid and fifth metatarsal, no longer having pain over the ATFL. ?We have ordered an MRI, still awaiting this, I can refill her tramadol as needed, we added a lateral wedge in her postop shoe today, return to see me to go over MRI results. ? ?Update: MRI does confirm a nondisplaced fracture through the cuboid bone, not seen on x-rays.  Needs to be in a boot.  Expect 8 weeks at least for healing ?

## 2021-08-09 ENCOUNTER — Ambulatory Visit (INDEPENDENT_AMBULATORY_CARE_PROVIDER_SITE_OTHER): Payer: 59

## 2021-08-09 ENCOUNTER — Other Ambulatory Visit: Payer: Self-pay

## 2021-08-09 DIAGNOSIS — S93602D Unspecified sprain of left foot, subsequent encounter: Secondary | ICD-10-CM

## 2021-08-10 ENCOUNTER — Encounter: Payer: Self-pay | Admitting: Sports Medicine

## 2021-08-27 ENCOUNTER — Encounter: Payer: Self-pay | Admitting: Sports Medicine

## 2021-08-28 ENCOUNTER — Telehealth: Payer: Self-pay | Admitting: *Deleted

## 2021-08-28 NOTE — Telephone Encounter (Signed)
Pt called and wanted to move forward with getting the cast placed on her foot however, Dr. Karie Schwalbe will not be in the office next week.  ? ?He suggested that she wear her post- op shoe for another week and after that schedule an appointment with him for the cast. If she felt that she could not wait until then he suggests that she be seen by one of his colleagues Dr. Clare Gandy at [facility] to have him place the cast for her.  ? ?I provided her with the phone number [phone].   ?

## 2021-08-31 ENCOUNTER — Encounter: Payer: Self-pay | Admitting: Family Medicine

## 2021-08-31 ENCOUNTER — Ambulatory Visit: Payer: 59 | Admitting: Family Medicine

## 2021-08-31 VITALS — BP 104/63 | Ht 65.0 in | Wt 295.0 lb

## 2021-08-31 DIAGNOSIS — S92215D Nondisplaced fracture of cuboid bone of left foot, subsequent encounter for fracture with routine healing: Secondary | ICD-10-CM | POA: Diagnosis not present

## 2021-08-31 NOTE — Patient Instructions (Signed)
Follow up with Dr. Karie Schwalbe for cast removal and reevaluation. ?I'll send a message to him too for when he returns to the office but message them again to see when he wants to see you back in the office. ?

## 2021-08-31 NOTE — Progress Notes (Signed)
Patient came in today for short leg cast placement for her left cuboid fracture as Dr. [name] is out of town.  She's had some persistent pain despite use of post-op shoe.  Also with numbness into 5th digit.  Short leg cast placed today with ankle at 90 degrees.  Felt comfortable to patient.  Crutches provided as well.  To follow up with Dr. [name] for further treatment. ?

## 2021-09-06 ENCOUNTER — Encounter: Payer: Self-pay | Admitting: Sports Medicine

## 2021-09-07 ENCOUNTER — Other Ambulatory Visit: Payer: Self-pay | Admitting: Nurse Practitioner

## 2021-09-07 ENCOUNTER — Other Ambulatory Visit (HOSPITAL_COMMUNITY)
Admission: RE | Admit: 2021-09-07 | Discharge: 2021-09-07 | Disposition: A | Discharge status: Home / Self Care | Payer: 59 | Source: Ambulatory Visit | Attending: Nurse Practitioner | Admitting: Nurse Practitioner

## 2021-09-07 DIAGNOSIS — Z124 Encounter for screening for malignant neoplasm of cervix: Secondary | ICD-10-CM | POA: Insufficient documentation

## 2021-09-08 LAB — CYTOLOGY - PAP
Comment: NEGATIVE
Diagnosis: NEGATIVE
High risk HPV: NEGATIVE

## 2021-09-09 ENCOUNTER — Encounter: Payer: Self-pay | Admitting: Family Medicine

## 2021-09-09 ENCOUNTER — Encounter: Payer: Self-pay | Admitting: Sports Medicine

## 2021-09-09 ENCOUNTER — Ambulatory Visit: Payer: 59 | Admitting: Family Medicine

## 2021-09-09 VITALS — BP 122/88 | HR 106 | Ht 65.0 in | Wt 293.0 lb

## 2021-09-09 DIAGNOSIS — S92215D Nondisplaced fracture of cuboid bone of left foot, subsequent encounter for fracture with routine healing: Secondary | ICD-10-CM

## 2021-09-09 MED ORDER — AMBULATORY NON FORMULARY MEDICATION: 1.0000 [IU] | Freq: Once | 0 refills | Status: AC

## 2021-09-09 NOTE — Patient Instructions (Addendum)
See me again in 2 weeks ? ?[facility] ?[address], [place] [address] ?[phone] ?

## 2021-09-09 NOTE — Progress Notes (Signed)
? ?  I, Philbert Riser, LAT, ATC acting as a scribe for Clementeen Graham, MD. ? ?Subjective:   ? ?CC: L foot fx ? ?HPI: Pt is a 36 y/o female presenting w/ a L, closed, non-displaced fx of the cuboid. 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 Pt was seen at the [facility] following the injury and again at the [facility] on 07/19/21.Pt was previously seen by Dr. Franne Forts on 08/31/21 due to original provider, Dr. Karie Schwalbe being out of the office. Pt was placed in a short leg cast and provided crutches. Pt contacted the [facility] on 09/08/21 reporting that she had broken the cast. Today, pt reports shooting pain in pinky toe of L foot. Has not noticed a difference with cast on. Complaining of broken piece of case underneath L heel.  ? ?Dx imaging: 08/09/21 L foot MRI ? 07/18/21 L foot XR (done @ [facility]) ? ?Pertinent review of Systems: No fevers or chills ? ?Relevant historical information: Left cuboid fracture ? ? ?Objective:   ? ?Vitals:  ? 09/09/21 1359  ?BP: 122/88  ?Pulse: (!) 106  ?SpO2: 98%  ? ?General: Well Developed, well nourished, and in no acute distress.  ? ?MSK: Left foot out of cast normal-appearing ?Tender palpation around the cuboid region on the dorsal lateral foot. ? ?Lab and Radiology Results ? ?A custom made short leg walking cast was applied to the left leg.  This was built in a robust fashion to hopefully withstand the extra forces. ? ? ? ? ?Impression and Recommendations:   ? ?Assessment and Plan: ?36 y.o. female with left foot pain due to cuboid fracture.  Patient is here today for a cast change as the medical doctor that applied the cast is on vacation and she needs some attention today.  Her regular sports medicine physician is Dr. [name] at [facility] in [place].  She lives in [place] and wonders if she can transfer her care for this injury to this location.  That is okay  with me and I think it would be okay with Dr. [name] as well. ? ?Plan for short leg walking cast.  However I think it is very important that she do her best to keep her weight off the cast and the foot.  Nonweightbearing or minimal weightbearing will allow the cuboid fracture to heal better.  I recommend using a knee scooter. ? ?Recheck in 2 weeks. ? ?PDMP not reviewed this encounter. ?No orders of the defined types were placed in this encounter. ? ?Meds ordered this encounter  ?Medications  ? AMBULATORY NON FORMULARY MEDICATION  ?  Sig: 1 Units by Other route once for 1 dose.  ?  Dispense:  1 Units  ?  Refill:  0  ?  Knee scooter  ? ? ?Discussed warning signs or symptoms. Please see discharge instructions. Patient expresses understanding. ? ? ?The above documentation has been reviewed and is accurate and complete Clementeen Graham, M.D. ? ?

## 2021-09-22 NOTE — Progress Notes (Signed)
? ?I, Christoper Cardenas, LAT, ATC, am serving as scribe for Dr. Clementeen Graham. ? ?Julie Cardenas is a 36 y.o. female who presents to [facility] today for f/u of L foot pain due to a cuboid fx.  She was last seen by Dr. Denyse Cardenas on 09/09/21 due to an issue w/ her cast that was originally applied by Dr. Benjamin Cardenas.  A new cast was applied due to the original one having a piece broken off of it.  She was also provided a prescription for a knee scooter.  Today, pt reports she was not able get the knee scooter either at [facility] or at another DME facility that her insurance company recommended only had respiratory equipment. Pt c/o soreness intermittently. ? ?Diagnostic testing: L foot MRI- 08/09/21 ? ?Pertinent review of systems: No fevers or chills ? ?Relevant historical information: Depression and anxiety.  Obesity. ? ? ?Exam:  ?BP 124/78   Pulse 82   Ht 5\' 5"  (1.651 m)   Wt 295 lb 9.6 oz (134.1 kg)   SpO2 98%   BMI 49.19 kg/m?  ?General: Well Developed, well nourished, and in no acute distress.  ? ?MSK: Left foot and ankle doing well in a short leg walking cast.  The cast is still in good shape with no cracks or excessive wear.  The visible toes are well-appearing with no significant swelling erythema.  They have good capillary refill and sensation. ? ? ? ?Lab and Radiology Results ? ?EXAM: ?MRI OF THE LEFT FOOT WITHOUT CONTRAST ?  ?TECHNIQUE: ?Multiplanar, multisequence MR imaging of the left foot was ?performed. No intravenous contrast was administered. ?  ?COMPARISON:  Left foot x-rays dated July 18, 2021. ?  ?FINDINGS: ?Bones/Joint/Cartilage ?  ?Acute nondisplaced fracture of the cuboid. Mild contusion of the ?talar head. No dislocation. Joint spaces are preserved. No joint ?effusion. ?  ?Ligaments ?  ?Collateral ligaments are intact.  Lisfranc ligament is intact. ?  ?Muscles and Tendons ?Flexor and extensor tendons are intact. No muscle edema or atrophy. ?  ?Soft tissue ?Mild  lateral midfoot soft tissue swelling. No fluid collection or ?hematoma. No soft tissue mass. ?  ?IMPRESSION: ?1. Acute nondisplaced fracture of the cuboid. ?2. Mild contusion of the talar head. ?  ?  ?Electronically Signed ?  By: July 20, 2021 M.D. ?  On: 08/10/2021 08:39 ?I, 08/12/2021, personally (independently) visualized and performed the interpretation of the images attached in this note. ? ? ? ? ? ?Assessment and Plan: ?36 y.o. female with left cuboid fracture.  Doing well with a short leg cast.  Ideally would like to have minimal to no weightbearing.  We reemphasized a knee scooter.  I think it is going to be affordable for her if she is able to rent 1 or buy one from 31.  We talked about do this at [facility] supply or [facility].  We also talked about plan on cutting the cast off in 2 weeks and transition into a cam walker boot.  She showed me the medical device that she had been using which was just a simple postop shoe.  I think she do better in a cam walker boot and advised her to purchase 1 of those herself at a time as it would be cheaper than getting it from my office. ? ?Recheck in 2 weeks or sooner if needed. ? ? ? ? ? ?Discussed warning signs or symptoms. Please see discharge instructions. Patient expresses understanding. ? ? ?  The above documentation has been reviewed and is accurate and complete [name], M.D. ? ? ?

## 2021-09-23 ENCOUNTER — Ambulatory Visit (INDEPENDENT_AMBULATORY_CARE_PROVIDER_SITE_OTHER): Payer: 59 | Admitting: Family Medicine

## 2021-09-23 VITALS — BP 124/78 | HR 82 | Ht 65.0 in | Wt 295.6 lb

## 2021-09-23 DIAGNOSIS — S92215D Nondisplaced fracture of cuboid bone of left foot, subsequent encounter for fracture with routine healing: Secondary | ICD-10-CM

## 2021-09-23 NOTE — Patient Instructions (Addendum)
Thank you for coming in today.  ? ?Please go to [facility] supply to get the long CAM walker boot and knee scooter we talked about today. You can rent these products. Or purchase them through [employer]. ? ?Recheck back in 2 weeks. ?

## 2021-10-05 ENCOUNTER — Encounter: Payer: Self-pay | Admitting: Family Medicine

## 2021-10-06 NOTE — Progress Notes (Signed)
? ?  I, Wendy Poet, LAT, ATC, am serving as scribe for Dr. [name]. ? ?Julie Cardenas is a 36 y.o. female who presents to [place] at [facility] today for f/u of L foot pain due to a cuboid fx.  She was last seen by Dr. Georgina Snell on 09/23/21 and noted intermittent soreness in her L foot.  She was unable to obtain a knee scooter due to an issue w/ her insurance and w/ facilities not having them.  She was educated about options for getting a knee scooter and was also advised to purchase a walking boot in preparation for removal of her hard cast.  Today, pt reports that she has now been able to purchase a knee scooter.  She also reports that she is feeling better than previously.  She con't to have some soreness but better overall. ? ?Diagnostic testing: L foot MRI- 08/09/21 ? ?Pertinent review of systems: No fevers or chills ? ?Relevant historical information: Depression.  Owns known small business [employer] ? ? ?Exam:  ?BP 110/80 (BP Location: Left Arm, Patient Position: Sitting, Cuff Size: Large)   Pulse 97   Ht 5\' 5"  (1.651 m)   Wt 293 lb 9.6 oz (133.2 kg)   LMP 09/28/2021   SpO2 94%   BMI 48.86 kg/m?  ?General: Well Developed, well nourished, and in no acute distress.  ? ?MSK: Left foot normal-appearing out of cast.  Minimally tender to palpation dorsal lateral midfoot. ? ? ? ?Lab and Radiology Results ? ?X-ray images left foot obtained today personally and independently interpreted ?Sensory ossicle is present lateral to the cuboid but no visible fracture is present within the cuboid on this x-ray.  ?Await formal radiology review ? ? ? ? ?Assessment and Plan: ?36 y.o. female with right cuboid fracture seen on MRI in March.  Patient has been immobilized with a short leg cast for the last month.  Ideally this was supposed to be a nonweightbearing cast but she has been doing a fair amount of weightbearing due to the necessities of life.  ?Plan to transition to a cam walker boot which should give  her a similar amount of immobilization but be a little more livable than a cast.  Encourage continued limited weightbearing or nonweightbearing.  Recheck in 1 month. ? ? ?PDMP not reviewed this encounter. ?Orders Placed This Encounter  ?Procedures  ? DG Foot Complete Left  ?  Standing Status:   Future  ?  Number of Occurrences:   1  ?  Standing Expiration Date:   11/07/2021  ?  Order Specific Question:   Reason for Exam (SYMPTOM  OR DIAGNOSIS REQUIRED)  ?  Answer:   L cuboid fracture  ?  Order Specific Question:   Is patient pregnant?  ?  Answer:   No  ?  Order Specific Question:   Preferred imaging location?  ?  Answer:   [facility]  ? ?No orders of the defined types were placed in this encounter. ? ? ? ?Discussed warning signs or symptoms. Please see discharge instructions. Patient expresses understanding. ? ? ?The above documentation has been reviewed and is accurate and complete [name], M.D. ? ? ?

## 2021-10-07 ENCOUNTER — Ambulatory Visit (INDEPENDENT_AMBULATORY_CARE_PROVIDER_SITE_OTHER): Payer: 59

## 2021-10-07 ENCOUNTER — Encounter: Payer: Self-pay | Admitting: Family Medicine

## 2021-10-07 ENCOUNTER — Ambulatory Visit: Payer: 59 | Admitting: Family Medicine

## 2021-10-07 VITALS — BP 110/80 | HR 97 | Ht 65.0 in | Wt 293.6 lb

## 2021-10-07 DIAGNOSIS — S92215D Nondisplaced fracture of cuboid bone of left foot, subsequent encounter for fracture with routine healing: Secondary | ICD-10-CM

## 2021-10-07 NOTE — Patient Instructions (Addendum)
Good to see you today. ? ?Please get an Xray today before you leave. ? ?Follow-up: one month ?

## 2021-10-09 NOTE — Progress Notes (Signed)
Left foot x-ray does show some evidence of healing of the cuboid fracture although it is difficult to see.

## 2021-11-03 NOTE — Progress Notes (Unsigned)
   I, Wendy Poet, LAT, ATC, am serving as scribe for Dr. [name].  Julie Cardenas is a 36 y.o. female who presents to [place] at [facility] today for  f/u of L foot pain due to a cuboid fx.  She was last seen by Dr. Georgina Snell on 10/07/21 and noted improvement in her L foot pain and was advised to transition out of a hard cast into a walking boot w/ limited weight-bearing.  Today, pt reports for an unknown reason, pt feels soreness today. 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 Pt has been compliant w/ wearing the CAM walker boot.   Diagnostic testing: L foot XR- 10/07/21; L foot MRI- 08/09/21  Pertinent review of systems: No fevers or chills  Relevant historical information: Depression obesity anxiety disorder.   Exam:  BP 132/88   Pulse 89   Ht 5\' 5"  (1.651 m)   Wt 290 lb 12.8 oz (131.9 kg)   SpO2 99%   BMI 48.39 kg/m  General: Well Developed, well nourished, and in no acute distress.   MSK: Left foot: Normal appearing. Minimally tender to palpation overlying cuboid otherwise nontender.  Normal foot and ankle motion.    Lab and Radiology Results EXAM: LEFT FOOT - COMPLETE 3+ VIEW   COMPARISON:  X-ray foot 07/18/2021; MR foot 08/09/2021.   FINDINGS: Nondisplaced cuboid fracture is better seen on the MRI. Some sclerosis suggested in the cuboid consistent with healing response. No malalignment   IMPRESSION: Some sclerosis at the cuboid likely related to interval healing changes of nondisplaced fracture     Electronically Signed   By: Donavan Foil M.D.   On: 10/08/2021 20:42   I, [name], personally (independently) visualized and performed the interpretation of the images attached in this note.     Assessment and Plan: 36 y.o. female with left cuboid fracture.  Slowly healing.  Primarily relying on her subjective experience.  Plan to continue CAM Walker boot with heavy duty activity.  Slowly wean out of the boot with normal light  activity at home.  We will start home exercise program taught in clinic today by ATC.  Check back in 6 weeks.   Discussed warning signs or symptoms. Please see discharge instructions. Patient expresses understanding.   The above documentation has been reviewed and is accurate and complete [name], M.D.

## 2021-11-04 ENCOUNTER — Ambulatory Visit: Payer: 59 | Admitting: Family Medicine

## 2021-11-04 VITALS — BP 132/88 | HR 89 | Ht 65.0 in | Wt 290.8 lb

## 2021-11-04 DIAGNOSIS — S92215D Nondisplaced fracture of cuboid bone of left foot, subsequent encounter for fracture with routine healing: Secondary | ICD-10-CM | POA: Diagnosis not present

## 2021-11-04 NOTE — Patient Instructions (Addendum)
Thank you for coming in today.   Please complete the exercises that the athletic trainer went over with you: View at [email or web address] using code: [email or web address]  Hope you have a great birthday!   You can slowly wean out of the boot, with light activity and good arch support  Check back in 6 weeks

## 2021-12-03 ENCOUNTER — Ambulatory Visit
Admission: EM | Admit: 2021-12-03 | Discharge: 2021-12-03 | Disposition: A | Discharge status: Home / Self Care | Payer: 59 | Attending: Family Medicine | Admitting: Family Medicine

## 2021-12-03 DIAGNOSIS — K122 Cellulitis and abscess of mouth: Secondary | ICD-10-CM | POA: Diagnosis not present

## 2021-12-03 MED ORDER — AMOXICILLIN 875 MG PO TABS: 875.0000 mg | ORAL_TABLET | Freq: Two times a day (BID) | ORAL | 0 refills | Status: AC

## 2021-12-03 MED ORDER — IBUPROFEN 800 MG PO TABS: 800.0000 mg | ORAL_TABLET | Freq: Three times a day (TID) | ORAL | 0 refills | Status: AC | PRN

## 2021-12-03 MED ORDER — FLUCONAZOLE 150 MG PO TABS: 150.0000 mg | ORAL_TABLET | Freq: Once | ORAL | 0 refills | Status: AC

## 2021-12-03 NOTE — Discharge Instructions (Signed)
Take amoxicillin 875 mg--1 tab twice daily for 7 days  Take ibuprofen 800 mg--1 tab every 8 hours as needed for pain.

## 2021-12-03 NOTE — ED Triage Notes (Signed)
Patient presents to Urgent Care with complaints of dental pain right side bottom since this morning. Patient reports dentist app tuesday.

## 2021-12-03 NOTE — ED Provider Notes (Addendum)
[name]    CSN: [id number] Arrival date & time: 12/03/21  9381      History   Chief Complaint Chief Complaint  Patient presents with   Dental Pain    HPI Julie Cardenas is a 36 y.o. female.    Dental Pain  Here for right lower dental pain that she first noted this morning.  She noted a bubble by the back of her lower dental ridge where there is a broken tooth.  She has made a dentist appointment but is not until July 11. No fever or vomiting  She is allergic to naproxen which causes stomach trouble.  Last menstrual cycle began yesterday   Past Medical History:  Diagnosis Date   Anxiety    no medicine, prayers aid her    Asthma    Bronchitis    Depression    Family history of anesthesia complication    mother slow waking up   Headache(784.0)    sinus related   Leg cramps    PCP- advised her to exercise    Seasonal allergies    Shortness of breath     Patient Active Problem List   Diagnosis Date Noted   Morbid obesity (HCC) 09/23/2021   Fracture of cuboid, left ankle, closed 07/22/2021   Major depressive disorder, recurrent episode, moderate (HCC) 12/05/2015   MDD (major depressive disorder), recurrent severe, 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/2015   Major depressive disorder, recurrent episode, moderate degree (HCC) 12/05/2015   Generalized anxiety disorder 10/04/2014   Nasal polyps 09/13/2013   Sinusitis, chronic 09/13/2013    Past Surgical History:  Procedure Laterality Date   SINUS ENDO W/FUSION Right 09/13/2013   Procedure: RIGHT ENDOSCOPIC SINUS SURGERY WITH FUSION SCAN;  Surgeon: Osborn Coho, MD;  Location: [facility] OR;  Service: ENT;  Laterality: Right;   SINUS ENDO W/FUSION Left 02/01/2014   Procedure: LEFT ENDOSCOPIC SINUS SURGERY/NASAL POLYPECTOMY WITH FUSION;  Surgeon: Osborn Coho, MD;  Location: [facility] OR;  Service: ENT;  Laterality: Left;   sinus polyps removed      OB History     Gravida  1   Para      Term      Preterm       AB      Living         SAB      IAB      Ectopic      Multiple      Live Births               Home Medications    Prior to Admission medications   Medication Sig Start Date End Date Taking? Authorizing Provider  amoxicillin (AMOXIL) 875 MG tablet Take 1 tablet (875 mg total) by mouth 2 (two) times daily for 7 days. 12/03/21 12/10/21 Yes Yanni Quiroa, Janace Aris, MD  fluconazole (DIFLUCAN) 150 MG tablet Take 1 tablet (150 mg total) by mouth once for 1 dose. And repeat in 3-7 days if need 12/03/21 12/03/21 Yes Kleigh Hoelzer, Janace Aris, MD  ibuprofen (ADVIL) 800 MG tablet Take 1 tablet (800 mg total) by mouth every 8 (eight) hours as needed (pain). 12/03/21  Yes Zigmond Trela, Janace Aris, MD  albuterol (VENTOLIN HFA) 108 (90 Base) MCG/ACT inhaler Inhale 1-2 puffs into the lungs every 6 (six) hours as needed for wheezing or shortness of breath. 04/03/21   Rhys Martini, PA-C  cetirizine (ZYRTEC) 10 MG tablet Take 1 tablet (10 mg total) by mouth daily. 02/03/21  Mound, [place] E, FNP  fluticasone furoate-vilanterol (BREO ELLIPTA) 200-25 MCG/ACT AEPB Inhale 200 mg into the lungs once.    [provider]    Family History Family History  Problem Relation Age of Onset   Diabetes Mother    Cancer Mother    Hypertension Mother     Social History Social History   Tobacco Use   Smoking status: Never   Smokeless tobacco: Never  Vaping Use   Vaping Use: Never used  Substance Use Topics   Alcohol use: Yes    Comment: occas.   Drug use: No     Allergies   Naproxen and Prednisone   Review of Systems Review of Systems   Physical Exam Triage Vital Signs ED Triage Vitals  Enc Vitals Group     BP 12/03/21 0942 119/74     Pulse Rate 12/03/21 0942 86     Resp 12/03/21 0942 18     Temp 12/03/21 0942 97.9 F (36.6 C)     Temp src --      SpO2 12/03/21 0942 98 %     Weight --      Height --      Head Circumference --      Peak Flow --      Pain Score 12/03/21 0941 3     Pain  Loc --      Pain Edu? --      Excl. in [place]? --    No data found.  Updated Vital Signs BP 119/74   Pulse 86   Temp 97.9 F (36.6 C)   Resp 18   LMP 12/02/2021   SpO2 98%   Visual Acuity Right Eye Distance:   Left Eye Distance:   Bilateral Distance:    Right Eye Near:   Left Eye Near:    Bilateral Near:     Physical Exam Vitals reviewed.  Constitutional:      General: She is not in acute distress.    Appearance: She is not ill-appearing, toxic-appearing or diaphoretic.  HENT:     Mouth/Throat:     Mouth: Mucous membranes are moist.     Comments: There is some swelling around the outer portion of her right posterior dental ridge on the bottom.  There is some broken tooth there Cardiovascular:     Rate and Rhythm: Normal rate and regular rhythm.     Heart sounds: No murmur heard. Pulmonary:     Effort: Pulmonary effort is normal.     Breath sounds: Normal breath sounds.  Skin:    Coloration: Skin is not pale.  Neurological:     Mental Status: She is alert and oriented to person, place, and time.  Psychiatric:        Behavior: Behavior normal.      UC Treatments / Results  Labs (all labs ordered are listed, but only abnormal results are displayed) Labs Reviewed - No data to display  EKG   Radiology No results found.  Procedures Procedures (including critical care time)  Medications Ordered in UC Medications - No data to display  Initial Impression / Assessment and Plan / UC Course  I have reviewed the triage vital signs and the nursing notes.  Pertinent labs & imaging results that were available during my care of the patient were reviewed by me and considered in my medical decision making (see chart for details).     We will treat with amoxicillin and ibuprofen, and she requested Diflucan for  possible yeast infection that she often gets with antibiotics Final Clinical Impressions(s) / UC Diagnoses   Final diagnoses:  Oral infection      Discharge Instructions      Take amoxicillin 875 mg--1 tab twice daily for 7 days  Take ibuprofen 800 mg--1 tab every 8 hours as needed for pain.       ED Prescriptions     Medication Sig Dispense Auth. Provider   amoxicillin (AMOXIL) 875 MG tablet Take 1 tablet (875 mg total) by mouth 2 (two) times daily for 7 days. 14 tablet Lacrystal Barbe, Gwenlyn Perking, MD   ibuprofen (ADVIL) 800 MG tablet Take 1 tablet (800 mg total) by mouth every 8 (eight) hours as needed (pain). 21 tablet Barrett Henle, MD   fluconazole (DIFLUCAN) 150 MG tablet Take 1 tablet (150 mg total) by mouth once for 1 dose. And repeat in 3-7 days if need 2 tablet Jalal Rauch, Gwenlyn Perking, MD      I have reviewed the PDMP during this encounter.   Barrett Henle, MD 12/03/21 [id number]    Barrett Henle, MD 12/03/21 [phone]

## 2021-12-15 NOTE — Progress Notes (Unsigned)
   I, [name], LAT, ATC, am serving as scribe for Dr. Clementeen Graham.  Julie Cardenas is a 36 y.o. female who presents to [facility] today for f/u of L foot pain due to a cuboid fx.  She was last seen by Dr. [name] on 11/04/21 and was advised to con't using her CAM walker boot w/ heavy activity and to slowly wean out of the boot w/ normal ADLs.  She was also shown a HEP.  Today, pt reports L foot is feeling good. She notes an occasional "ache," but overall is feeling well. Pt has been wearing her CAM walker foot when she is out of the house or standing for long periods.   Diagnostic testing: L foot XR- 10/07/21; L foot MRI- 08/09/21  Pertinent review of systems: No fevers or chills   Relevant historical information: History of depression   Exam:  BP 138/82   Pulse 96   Ht 5\' 5"  (1.651 m)   Wt 289 lb 6.4 oz (131.3 kg)   LMP 12/02/2021   SpO2 98%   BMI 48.16 kg/m  General: Well Developed, well nourished, and in no acute distress.   MSK: Left foot: Nontender normal foot and ankle motion.     Assessment and Plan: 36 y.o. female with left cuboid fracture.  Very slow to heal but clinically is healing.  Spent time today discussing how to wean out of the boot successfully.  Recommend setting a timer and literally calculating percentages of time out of the boot while standing at home and increasing 10 %/week..  This should be modulated based on pain.  If all is well check back in 3 months.  If having trouble I like to see her or her from her sooner.  Also recommend continued home exercise program with elastic bands to previously.  Reviewed those exercises today prior to discharge.     Discussed warning signs or symptoms. Please see discharge instructions. Patient expresses understanding.   The above documentation has been reviewed and is accurate and complete 31, M.D.

## 2021-12-16 ENCOUNTER — Ambulatory Visit: Payer: Commercial Managed Care - HMO | Admitting: Family Medicine

## 2021-12-16 VITALS — BP 138/82 | HR 96 | Ht 65.0 in | Wt 289.4 lb

## 2021-12-16 DIAGNOSIS — S92215D Nondisplaced fracture of cuboid bone of left foot, subsequent encounter for fracture with routine healing: Secondary | ICD-10-CM

## 2021-12-16 NOTE — Patient Instructions (Addendum)
Thank you for coming in today.   Slowly wean out of the boot.  Make sure the shoes have great arch support.   Recheck in 3 months.   Continue the home exercises.

## 2022-03-17 ENCOUNTER — Ambulatory Visit: Payer: 59 | Admitting: Family Medicine

## 2022-03-22 ENCOUNTER — Ambulatory Visit: Payer: Commercial Managed Care - HMO | Admitting: Family Medicine

## 2022-03-22 NOTE — Progress Notes (Deleted)
   I, Julie Cardenas, LAT, ATC acting as a scribe for Julie Leader, MD.  Julie Cardenas is a 36 y.o. female who presents to [facility] today for her 87-month follow-up for a left closed nondisplaced placed fracture of the cuboid.  Patient was last seen by Dr. Georgina Snell on 12/16/2021 and was advised to continue HEP and begin weaning out of the boot.  Today, patient reports  Dx imaging: 10/07/2021 left foot x-ray  08/09/2021 left foot MRI  Pertinent review of systems: ***  Relevant historical information: ***   Exam:  There were no vitals taken for this visit. General: Well Developed, well nourished, and in no acute distress.   MSK: ***    Lab and Radiology Results No results found for this or any previous visit (from the past 72 hour(s)). No results found.     Assessment and Plan: 36 y.o. female with ***   PDMP not reviewed this encounter. No orders of the defined types were placed in this encounter.  No orders of the defined types were placed in this encounter.    Discussed warning signs or symptoms. Please see discharge instructions. Patient expresses understanding.   ***

## 2022-09-01 ENCOUNTER — Ambulatory Visit
Admission: EM | Admit: 2022-09-01 | Discharge: 2022-09-01 | Disposition: A | Discharge status: Home / Self Care | Payer: Commercial Managed Care - HMO | Attending: Emergency Medicine | Admitting: Emergency Medicine

## 2022-09-01 DIAGNOSIS — K649 Unspecified hemorrhoids: Secondary | ICD-10-CM

## 2022-09-01 MED ORDER — DOCUSATE SODIUM 50 MG PO CAPS: 50.0000 mg | ORAL_CAPSULE | Freq: Every day | ORAL | 0 refills | Status: AC

## 2022-09-01 MED ORDER — HYDROCORTISONE (PERIANAL) 2.5 % EX CREA: 1.0000 | TOPICAL_CREAM | Freq: Two times a day (BID) | CUTANEOUS | 0 refills | Status: AC

## 2022-09-01 NOTE — Discharge Instructions (Signed)
On exam unable to visualize hemorrhoid Which thinks it is most likely internal which is why the topical medicine has not been as effective  Place hydrocortisone suppository twice daily for 5 days, this medicine helps to reduce inflammation and will help to calm down discomfort, insert and then lie on left side for 5 minutes to allow for medicine to dissolve and take effect  Constipation will worsen your symptoms therefore begin use of Colace daily which is a stool softener, this medicine will not make you go to the bathroom but when you naturally have to go it will keep stools from becoming hard and, in addition to this you may continue use of probiotics, increase fluid intake primarily through water and increase fiber  At any point if you begin to see large amounts of blood, severe pain or hardness to your rectum please follow-up for reevaluation

## 2022-09-01 NOTE — ED Triage Notes (Signed)
Pt presents with flare up of Hemorrhoids X 6 days

## 2022-09-01 NOTE — ED Provider Notes (Signed)
[name]    CSN: [id number] Arrival date & time: 09/01/22  0930      History   Chief Complaint Chief Complaint  Patient presents with   Hemorrhoids    HPI CONSUELLA STRASSMAN is a 37 y.o. female.   Patient presents for evaluation of rectal pain and burning sensation for 6 days which she believes to be hemorrhoids, started after episode of constipation.  Constipation has resolved after increasing fluid intake.  Has been managing discomfort with witch hazel and hemorrhoid cream which has been minimally effective.  Denies anal itching or rectal bleeding.  Symptoms have occurred before.   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/05/2015   Major depressive disorder, recurrent episode, moderate degree 12/05/2015   Generalized anxiety disorder 10/04/2014   Nasal polyps 09/13/2013   Sinusitis, chronic 09/13/2013    Past Surgical History:  Procedure Laterality Date   SINUS ENDO W/FUSION Right 09/13/2013   Procedure: RIGHT ENDOSCOPIC SINUS SURGERY WITH FUSION SCAN;  Surgeon: [name], MD;  Location: [facility] OR;  Service: ENT;  Laterality: Right;   SINUS ENDO W/FUSION Left 02/01/2014   Procedure: LEFT ENDOSCOPIC SINUS SURGERY/NASAL POLYPECTOMY WITH FUSION;  Surgeon: [name], MD;  Location: [facility] OR;  Service: ENT;  Laterality: Left;   sinus polyps removed      OB History     Gravida  1   Para      Term       Preterm      AB      Living         SAB      IAB      Ectopic      Multiple      Live Births               Home Medications    Prior to Admission medications   Medication Sig Start Date End Date Taking? Authorizing Provider  albuterol (VENTOLIN HFA) 108 (90 Base) MCG/ACT inhaler Inhale 1-2 puffs into the lungs every 6 (six) hours as needed for wheezing or shortness of breath. 04/03/21   Hazel Sams, PA-C  cetirizine (ZYRTEC) 10 MG tablet Take 1 tablet (10 mg total) by mouth daily. 02/03/21   Mound, Michele Rockers, FNP  fluticasone furoate-vilanterol (BREO ELLIPTA) 200-25 MCG/ACT AEPB Inhale 200 mg into the lungs once.    [provider]  ibuprofen (ADVIL) 800 MG tablet Take 1 tablet (800 mg total) by mouth every 8 (eight) hours as needed (pain). 12/03/21   Barrett Henle, MD    Family History Family History  Problem Relation Age of Onset   Diabetes Mother    Cancer Mother    Hypertension Mother     Social History Social History   Tobacco Use   Smoking status: Never   Smokeless tobacco:  Never  Vaping Use   Vaping Use: Never used  Substance Use Topics   Alcohol use: Yes    Comment: occas.   Drug use: No     Allergies   Naproxen and Prednisone   Review of Systems Review of Systems   Physical Exam Triage Vital Signs ED Triage Vitals [09/01/22 1106]  Enc Vitals Group     BP 126/76     Pulse Rate 81     Resp 18     Temp 98 F (36.7 C)     Temp Source Oral     SpO2 98 %     Weight      Height      Head Circumference      Peak Flow      Pain Score 7     Pain Loc      Pain Edu?      Excl. in [place]?    No data found.  Updated Vital Signs BP 126/76 (BP Location: Left Arm)   Pulse 81   Temp 98 F (36.7 C) (Oral)   Resp 18   LMP 08/27/2022   SpO2 98%   Visual Acuity Right Eye Distance:   Left Eye Distance:   Bilateral Distance:    Right Eye Near:   Left Eye Near:    Bilateral Near:     Physical Exam Constitutional:       Appearance: Normal appearance.  Eyes:     Extraocular Movements: Extraocular movements intact.  Pulmonary:     Effort: Pulmonary effort is normal.  Genitourinary:    Comments: No external hemorrhoid noted on exam, no anal fissure, rectal tone is normal Skin:    General: Skin is warm and dry.  Neurological:     Mental Status: She is alert and oriented to person, place, and time. Mental status is at baseline.      UC Treatments / Results  Labs (all labs ordered are listed, but only abnormal results are displayed) Labs Reviewed - No data to display  EKG   Radiology No results found.  Procedures Procedures (including critical care time)  Medications Ordered in UC Medications - No data to display  Initial Impression / Assessment and Plan / UC Course  I have reviewed the triage vital signs and the nursing notes.  Pertinent labs & imaging results that were available during my care of the patient were reviewed by me and considered in my medical decision making (see chart for details).  Hemorrhoid  Most likely internal, discussed with patient, Anusol suppository prescribed, discussed administration, most likely caused by constipation therefore Colace prescribed advised continued use of probiotics and increase fluid intake as well as increasing fiber for management, given strict precautions for incarceration, may follow-up with his urgent care as needed Final Clinical Impressions(s) / UC Diagnoses   Final diagnoses:  None   Discharge Instructions   None    ED Prescriptions   None    PDMP not reviewed this encounter.   Hans Eden, NP 09/01/22 1140

## 2022-09-13 ENCOUNTER — Encounter: Payer: Self-pay | Admitting: *Deleted

## 2023-02-05 ENCOUNTER — Ambulatory Visit
Admission: EM | Admit: 2023-02-05 | Discharge: 2023-02-05 | Disposition: A | Discharge status: Home / Self Care | Payer: Self-pay | Attending: Internal Medicine | Admitting: Internal Medicine

## 2023-02-05 ENCOUNTER — Other Ambulatory Visit: Payer: Self-pay

## 2023-02-05 ENCOUNTER — Ambulatory Visit (INDEPENDENT_AMBULATORY_CARE_PROVIDER_SITE_OTHER): Payer: Self-pay

## 2023-02-05 ENCOUNTER — Encounter: Payer: Self-pay | Admitting: *Deleted

## 2023-02-05 DIAGNOSIS — R059 Cough, unspecified: Secondary | ICD-10-CM

## 2023-02-05 DIAGNOSIS — J189 Pneumonia, unspecified organism: Secondary | ICD-10-CM

## 2023-02-05 MED ORDER — BENZONATATE 100 MG PO CAPS: 100.0000 mg | ORAL_CAPSULE | Freq: Three times a day (TID) | ORAL | 0 refills | Status: DC | PRN

## 2023-02-05 MED ORDER — AMOXICILLIN 500 MG PO CAPS: 1000.0000 mg | ORAL_CAPSULE | Freq: Three times a day (TID) | ORAL | 0 refills | Status: DC

## 2023-02-05 MED ORDER — AZITHROMYCIN 200 MG/5ML PO SUSR: ORAL | 0 refills | Status: AC

## 2023-02-05 MED ORDER — AMOXICILLIN 400 MG/5ML PO SUSR: 1000.0000 mg | Freq: Three times a day (TID) | ORAL | 0 refills | Status: AC

## 2023-02-05 MED ORDER — AZITHROMYCIN 250 MG PO TABS: ORAL_TABLET | ORAL | 0 refills | Status: DC

## 2023-02-05 NOTE — Discharge Instructions (Addendum)
It appears that you have pneumonia of your right lung.  I have prescribed you 2 antibiotics to treat this as well as a cough medication.  Please follow-up with primary care or urgent care in 4 to 6 weeks to have repeat chest x-ray.  Follow-up sooner if symptoms persist or worsen.

## 2023-02-05 NOTE — ED Provider Notes (Signed)
[name]    CSN: [id number] Arrival date & time: 02/05/23  0815      History   Chief Complaint Chief Complaint  Patient presents with   Headache   Cough   Nasal Congestion    HPI Julie Cardenas is a 37 y.o. female.   Patient presents with 2-week history of nasal congestion, postnasal drip, cough.  Reports that cough is productive with green sputum.  Patient does have a history of asthma and reports that she used her albuterol inhaler a few days prior with some improvement.  Denies any current shortness of breath but patient reports pain with inspiration and with coughing.  Denies any fever or known sick contacts.  Patient also uses Breo inhaler daily for her asthma.   Headache Cough   Past Medical History:  Diagnosis Date   Anxiety    no medicine, prayers aid her    Asthma    Bronchitis    Depression    Family history of anesthesia complication    mother slow waking up   Headache(784.0)    sinus related   Leg cramps    PCP- advised her to exercise    Seasonal allergies    Shortness of breath     Patient Active Problem List   Diagnosis Date Noted   Morbid obesity (HCC) 09/23/2021   Fracture of cuboid, left ankle, closed 07/22/2021   Major depressive disorder, recurrent episode, moderate (HCC) 12/05/2015   MDD (major depressive disorder), recurrent severe, 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/2015   Major depressive disorder, recurrent episode, moderate degree (HCC) 12/05/2015   Generalized anxiety disorder 10/04/2014   Nasal polyps 09/13/2013   Sinusitis, chronic 09/13/2013    Past Surgical History:  Procedure Laterality Date   SINUS ENDO W/FUSION Right 09/13/2013   Procedure: RIGHT ENDOSCOPIC SINUS SURGERY WITH FUSION SCAN;  Surgeon: Osborn Coho, MD;  Location: [facility] OR;  Service: ENT;  Laterality: Right;   SINUS ENDO W/FUSION Left 02/01/2014   Procedure: LEFT ENDOSCOPIC SINUS SURGERY/NASAL POLYPECTOMY WITH FUSION;  Surgeon: Osborn Coho, MD;   Location: [facility] OR;  Service: ENT;  Laterality: Left;   sinus polyps removed      OB History     Gravida  1   Para      Term      Preterm      AB      Living         SAB      IAB      Ectopic      Multiple      Live Births               Home Medications    Prior to Admission medications   Medication Sig Start Date End Date Taking? Authorizing Provider  albuterol (VENTOLIN HFA) 108 (90 Base) MCG/ACT inhaler Inhale 1-2 puffs into the lungs every 6 (six) hours as needed for wheezing or shortness of breath. 04/03/21  Yes Rhys Martini, PA-C  amoxicillin (AMOXIL) 500 MG capsule Take 2 capsules (1,000 mg total) by mouth 3 (three) times daily for 5 days. 02/05/23 02/10/23 Yes [place] Mullett, Acie Fredrickson, FNP  azithromycin (ZITHROMAX Z-PAK) 250 MG tablet Take 2 tablets (500 mg total) by mouth daily for 1 day, THEN 1 tablet (250 mg total) daily for 4 days. 02/05/23 02/10/23 Yes Nyaira Hodgens, Acie Fredrickson, FNP  benzonatate (TESSALON) 100 MG capsule Take 1 capsule (100 mg total) by mouth every 8 (eight) hours as needed for cough. 02/05/23  Yes Jeanelle Dake, Dutton E, FNP  cetirizine (ZYRTEC) 10 MG tablet Take 1 tablet (10 mg total) by mouth daily. 02/03/21  Yes Trinitee Horgan, Rolly Salter E, FNP  docusate sodium (COLACE) 50 MG capsule Take 1 capsule (50 mg total) by mouth daily. 09/01/22  Yes White, Adrienne R, NP  fluticasone furoate-vilanterol (BREO ELLIPTA) 200-25 MCG/ACT AEPB Inhale 200 mg into the lungs once.   Yes [provider]  hydrocortisone (ANUSOL-HC) 2.5 % rectal cream Place 1 Application rectally 2 (two) times daily. 09/01/22  Yes White, Adrienne R, NP  ibuprofen (ADVIL) 800 MG tablet Take 1 tablet (800 mg total) by mouth every 8 (eight) hours as needed (pain). 12/03/21  Yes Banister, Janace Aris, MD    Family History Family History  Problem Relation Age of Onset   Diabetes Mother    Cancer Mother    Hypertension Mother     Social History Social History   Tobacco Use   Smoking status: Never   Smokeless  tobacco: Never  Vaping Use   Vaping status: Never Used  Substance Use Topics   Alcohol use: Yes    Comment: occas.   Drug use: No     Allergies   Naproxen, Other, and Prednisone   Review of Systems Review of Systems Per HPI  Physical Exam Triage Vital Signs ED Triage Vitals  Encounter Vitals Group     BP 02/05/23 0836 (!) 127/97     Systolic BP Percentile --      Diastolic BP Percentile --      Pulse Rate 02/05/23 0836 82     Resp 02/05/23 0836 20     Temp 02/05/23 0836 98 F (36.7 C)     Temp src --      SpO2 02/05/23 0836 98 %     Weight --      Height --      Head Circumference --      Peak Flow --      Pain Score 02/05/23 0833 3     Pain Loc --      Pain Education --      Exclude from Growth Chart --    No data found.  Updated Vital Signs BP (!) 127/97   Pulse 82   Temp 98 F (36.7 C)   Resp 20   LMP 01/25/2023   SpO2 98%   Visual Acuity Right Eye Distance:   Left Eye Distance:   Bilateral Distance:    Right Eye Near:   Left Eye Near:    Bilateral Near:     Physical Exam Constitutional:      General: She is not in acute distress.    Appearance: Normal appearance. She is not toxic-appearing or diaphoretic.  HENT:     Head: Normocephalic and atraumatic.     Right Ear: Tympanic membrane and ear canal normal.     Left Ear: Tympanic membrane and ear canal normal.     Nose: Congestion present.     Mouth/Throat:     Mouth: Mucous membranes are moist.     Pharynx: Posterior oropharyngeal erythema present.  Eyes:     Extraocular Movements: Extraocular movements intact.     Conjunctiva/sclera: Conjunctivae normal.     Pupils: Pupils are equal, round, and reactive to light.  Cardiovascular:     Rate and Rhythm: Normal rate and regular rhythm.     Pulses: Normal pulses.     Heart sounds: Normal heart sounds.  Pulmonary:     Effort:  Pulmonary effort is normal. No respiratory distress.     Breath sounds: Normal breath sounds. No stridor. No  wheezing, rhonchi or rales.  Abdominal:     General: Abdomen is flat. Bowel sounds are normal.     Palpations: Abdomen is soft.  Musculoskeletal:        General: Normal range of motion.     Cervical back: Normal range of motion.  Skin:    General: Skin is warm and dry.  Neurological:     General: No focal deficit present.     Mental Status: She is alert and oriented to person, place, and time. Mental status is at baseline.  Psychiatric:        Mood and Affect: Mood normal.        Behavior: Behavior normal.      UC Treatments / Results  Labs (all labs ordered are listed, but only abnormal results are displayed) Labs Reviewed - No data to display  EKG   Radiology DG Chest 2 View  Result Date: 02/05/2023 CLINICAL DATA:  cough EXAM: CHEST - 2 VIEW COMPARISON:  CXR 09/01/19, CXR 02/19/19 FINDINGS: The heart size and mediastinal contours are within normal limits. No pleural effusion. No pneumothorax. Possible hazy airspace opacity in the right middle lobe (best seen on the lateral view). The visualized skeletal structures are unremarkable. IMPRESSION: Possible hazy airspace opacity in the right middle lobe could represent infection. Electronically Signed   By: Lorenza Cambridge M.D.   On: 02/05/2023 09:09    Procedures Procedures (including critical care time)  Medications Ordered in UC Medications - No data to display  Initial Impression / Assessment and Plan / UC Course  I have reviewed the triage vital signs and the nursing notes.  Pertinent labs & imaging results that were available during my care of the patient were reviewed by me and considered in my medical decision making (see chart for details).     There is suspicion of community-acquired pneumonia in the right middle lobe on chest x-ray.  Will treat with amoxicillin and azithromycin.  Advised patient to return to PCP or urgent care in 4 to 6 weeks to have repeat x-ray x-ray to ensure infection is clear.  Oxygen is normal and  patient is not tachypneic so do think outpatient treatment is reasonable. Advised supportive care and symptom management.  Advised strict follow-up precautions.  Patient verbalized understanding and was agreeable with plan.  Patient requesting liquid oral medication as opposed to pills.  Pharmacy was called to discontinue pills and will send liquid medicine. Final Clinical Impressions(s) / UC Diagnoses   Final diagnoses:  Community acquired pneumonia of right middle lobe of lung     Discharge Instructions      It appears that you have pneumonia of your right lung.  I have prescribed you 2 antibiotics to treat this as well as a cough medication.  Please follow-up with primary care or urgent care in 4 to 6 weeks to have repeat chest x-ray.  Follow-up sooner if symptoms persist or worsen.     ED Prescriptions     Medication Sig Dispense Auth. Provider   benzonatate (TESSALON) 100 MG capsule Take 1 capsule (100 mg total) by mouth every 8 (eight) hours as needed for cough. 21 capsule [place], Finklea E, [place]   amoxicillin (AMOXIL) 500 MG capsule Take 2 capsules (1,000 mg total) by mouth 3 (three) times daily for 5 days. 30 capsule [place], Rolly Salter E, [place]   azithromycin (ZITHROMAX Z-PAK)  250 MG tablet Take 2 tablets (500 mg total) by mouth daily for 1 day, THEN 1 tablet (250 mg total) daily for 4 days. 6 tablet [place], Acie Fredrickson, [place]      PDMP not reviewed this encounter.   Gustavus Bryant, [place] 02/05/23 [phone]

## 2023-02-05 NOTE — ED Triage Notes (Signed)
Pt reports for 2 weeks she has had post nasal drip,cough and HA. Pt has had green to yellow mucous. Pt also has had muscle pain with cough.

## 2023-02-16 ENCOUNTER — Other Ambulatory Visit: Payer: Self-pay

## 2023-02-16 ENCOUNTER — Emergency Department (HOSPITAL_BASED_OUTPATIENT_CLINIC_OR_DEPARTMENT_OTHER): Payer: Self-pay

## 2023-02-16 ENCOUNTER — Encounter (HOSPITAL_BASED_OUTPATIENT_CLINIC_OR_DEPARTMENT_OTHER): Payer: Self-pay

## 2023-02-16 ENCOUNTER — Emergency Department (HOSPITAL_BASED_OUTPATIENT_CLINIC_OR_DEPARTMENT_OTHER)
Admission: EM | Admit: 2023-02-16 | Discharge: 2023-02-16 | Disposition: A | Discharge status: Home / Self Care | Payer: Self-pay | Attending: Emergency Medicine | Admitting: Emergency Medicine

## 2023-02-16 DIAGNOSIS — R0789 Other chest pain: Secondary | ICD-10-CM | POA: Insufficient documentation

## 2023-02-16 DIAGNOSIS — R791 Abnormal coagulation profile: Secondary | ICD-10-CM | POA: Insufficient documentation

## 2023-02-16 DIAGNOSIS — R7989 Other specified abnormal findings of blood chemistry: Secondary | ICD-10-CM

## 2023-02-16 DIAGNOSIS — J45909 Unspecified asthma, uncomplicated: Secondary | ICD-10-CM | POA: Insufficient documentation

## 2023-02-16 DIAGNOSIS — Z7951 Long term (current) use of inhaled steroids: Secondary | ICD-10-CM | POA: Insufficient documentation

## 2023-02-16 LAB — COMPREHENSIVE METABOLIC PANEL WITH GFR
ALT: 11 U/L (ref 0–44)
AST: 14 U/L — ABNORMAL LOW (ref 15–41)
Albumin: 4.1 g/dL (ref 3.5–5.0)
Alkaline Phosphatase: 74 U/L (ref 38–126)
Anion gap: 10 (ref 5–15)
BUN: 8 mg/dL (ref 6–20)
CO2: 23 mmol/L (ref 22–32)
Calcium: 9.1 mg/dL (ref 8.9–10.3)
Chloride: 103 mmol/L (ref 98–111)
Creatinine, Ser: 0.88 mg/dL (ref 0.44–1.00)
GFR, Estimated: >60 mL/min (ref >60)
Glucose, Bld: 112 mg/dL — ABNORMAL HIGH (ref 70–99)
Potassium: 4 mmol/L (ref 3.5–5.1)
Sodium: 136 mmol/L (ref 135–145)
Total Bilirubin: 0.5 mg/dL (ref 0.3–1.2)
Total Protein: 8 g/dL (ref 6.5–8.1)

## 2023-02-16 LAB — CBC WITH DIFFERENTIAL/PLATELET
Abs Immature Granulocytes: 0.01 10*3/uL (ref 0.00–0.07)
Basophils Absolute: 0 10*3/uL (ref 0.0–0.1)
Basophils Relative: 1 %
Eosinophils Absolute: 0.3 10*3/uL (ref 0.0–0.5)
Eosinophils Relative: 3 %
HCT: 37.2 % (ref 36.0–46.0)
Hemoglobin: 12.1 g/dL (ref 12.0–15.0)
Immature Granulocytes: 0 %
Lymphocytes Relative: 37 %
Lymphs Abs: 3.1 10*3/uL (ref 0.7–4.0)
MCH: 25.7 pg — ABNORMAL LOW (ref 26.0–34.0)
MCHC: 32.5 g/dL (ref 30.0–36.0)
MCV: 79.1 fL — ABNORMAL LOW (ref 80.0–100.0)
Monocytes Absolute: 0.8 10*3/uL (ref 0.1–1.0)
Monocytes Relative: 9 %
Neutro Abs: 4.2 10*3/uL (ref 1.7–7.7)
Neutrophils Relative %: 50 %
Platelets: 424 10*3/uL — ABNORMAL HIGH (ref 150–400)
RBC: 4.7 MIL/uL (ref 3.87–5.11)
RDW: 15.1 % (ref 11.5–15.5)
WBC: 8.4 10*3/uL (ref 4.0–10.5)
nRBC: 0 % (ref 0.0–0.2)

## 2023-02-16 LAB — TROPONIN I (HIGH SENSITIVITY): Troponin I (High Sensitivity): 3 ng/L (ref <18)

## 2023-02-16 LAB — HCG, QUANTITATIVE, PREGNANCY: hCG, Beta Chain, Quant, S: <1 m[IU]/mL (ref <5)

## 2023-02-16 MED ORDER — IOHEXOL 350 MG/ML SOLN
100.0000 mL | Freq: Once | INTRAVENOUS | Status: AC | PRN
  Administered 2023-02-16: 85 mL via INTRAVENOUS

## 2023-02-16 NOTE — Discharge Instructions (Signed)
Take ibuprofen 600 mg 3 times daily for the next 5 days.  Rest.  Return to the ER if you develop worsening pain, difficulty breathing, high fever, or for other new and concerning symptoms.

## 2023-02-16 NOTE — ED Provider Notes (Signed)
[place] EMERGENCY DEPARTMENT AT [facility] Provider Note   CSN: [id number] Arrival date & time: 02/16/23  0024     History  Chief Complaint  Patient presents with   Chest Pain    Julie Cardenas is a 37 y.o. female.  Patient is a 37 year old female with history of asthma and recent pneumonia.  She completed antibiotics last week, then 2 days ago began experiencing tightness to her upper chest.  This sensation comes and goes and makes her feel short of breath.  She denies fevers, chills, or productive cough.  She was seen by her primary doctor and was found to have an elevated D-dimer.  She was then referred here for CTA of the chest to rule out a blood clot.  The history is provided by the patient.       Home Medications Prior to Admission medications   Medication Sig Start Date End Date Taking? Authorizing Provider  albuterol (VENTOLIN HFA) 108 (90 Base) MCG/ACT inhaler Inhale 1-2 puffs into the lungs every 6 (six) hours as needed for wheezing or shortness of breath. 04/03/21   Rhys Martini, PA-C  cetirizine (ZYRTEC) 10 MG tablet Take 1 tablet (10 mg total) by mouth daily. 02/03/21   Gustavus Bryant, FNP  docusate sodium (COLACE) 50 MG capsule Take 1 capsule (50 mg total) by mouth daily. 09/01/22   White, Elita Boone, NP  fluticasone furoate-vilanterol (BREO ELLIPTA) 200-25 MCG/ACT AEPB Inhale 200 mg into the lungs once.    [provider]  hydrocortisone (ANUSOL-HC) 2.5 % rectal cream Place 1 Application rectally 2 (two) times daily. 09/01/22   White, Elita Boone, NP  ibuprofen (ADVIL) 800 MG tablet Take 1 tablet (800 mg total) by mouth every 8 (eight) hours as needed (pain). 12/03/21   Zenia Resides, MD      Allergies    Naproxen, Other, and Prednisone    Review of Systems   Review of Systems  All other systems reviewed and are negative.   Physical Exam Updated Vital Signs BP (!) 164/78   Pulse (!) 108   Temp 98.2 F (36.8 C) (Oral)   Resp 20   Ht  5\' 5"  (1.651 m)   Wt 124.7 kg   LMP 01/25/2023   SpO2 100%   BMI 45.76 kg/m  Physical Exam Vitals and nursing note reviewed.  Constitutional:      General: She is not in acute distress.    Appearance: She is well-developed. She is not diaphoretic.  HENT:     Head: Normocephalic and atraumatic.  Cardiovascular:     Rate and Rhythm: Normal rate and regular rhythm.     Heart sounds: No murmur heard.    No friction rub. No gallop.  Pulmonary:     Effort: Pulmonary effort is normal. No respiratory distress.     Breath sounds: Normal breath sounds. No wheezing.  Abdominal:     General: Bowel sounds are normal. There is no distension.     Palpations: Abdomen is soft.     Tenderness: There is no abdominal tenderness.  Musculoskeletal:        General: Normal range of motion.     Cervical back: Normal range of motion and neck supple.     Right lower leg: No tenderness. No edema.     Left lower leg: No tenderness. No edema.     Comments: Denna Haggard' sign is absent bilaterally.  Skin:    General: Skin is warm and dry.  Neurological:     General: No focal deficit present.     Mental Status: She is alert and oriented to person, place, and time.     ED Results / Procedures / Treatments   Labs (all labs ordered are listed, but only abnormal results are displayed) Labs Reviewed - No data to display  EKG None  Radiology No results found.  Procedures Procedures    Medications Ordered in ED Medications - No data to display  ED Course/ Medical Decision Making/ A&P  Patient is a 37 year old female presenting with complaints of chest pain as described in the HPI.  She had a D-dimer obtained by her primary doctor which she was told later was positive and was instructed to come to the ER for a CT scan to rule out PE.  Patient arrives here with stable vital signs and is afebrile.  There is no hypoxia.  Workup initiated including CBC, metabolic panel, and troponin, all of which are  unremarkable.  Pregnancy test negative.  CTA of the chest obtained showing no evidence for pulmonary embolism or other acute pathology.  At this point, symptoms seem musculoskeletal in nature.  Patient will be treated for costochondritis with ibuprofen, rest, and follow-up as needed.  Final Clinical Impression(s) / ED Diagnoses Final diagnoses:  None    Rx / DC Orders ED Discharge Orders     None         Geoffery Lyons, MD 02/16/23 [phone]

## 2023-02-16 NOTE — ED Triage Notes (Signed)
Pt states that she was diagnosed with pneumonia on the 7th, finished antibiotics, CP started again on Sun with SOB and went to UC today and had d dimer done that was elevated and told to come for CT scan

## 2023-05-01 ENCOUNTER — Emergency Department (HOSPITAL_BASED_OUTPATIENT_CLINIC_OR_DEPARTMENT_OTHER): Payer: PRIVATE HEALTH INSURANCE

## 2023-05-01 ENCOUNTER — Emergency Department (HOSPITAL_BASED_OUTPATIENT_CLINIC_OR_DEPARTMENT_OTHER)
Admission: EM | Admit: 2023-05-01 | Discharge: 2023-05-01 | Disposition: A | Discharge status: Home / Self Care | Payer: PRIVATE HEALTH INSURANCE

## 2023-05-01 ENCOUNTER — Other Ambulatory Visit: Payer: Self-pay

## 2023-05-01 DIAGNOSIS — M79605 Pain in left leg: Secondary | ICD-10-CM | POA: Insufficient documentation

## 2023-05-01 DIAGNOSIS — M7989 Other specified soft tissue disorders: Secondary | ICD-10-CM | POA: Diagnosis not present

## 2023-05-01 DIAGNOSIS — M79662 Pain in left lower leg: Secondary | ICD-10-CM

## 2023-05-01 LAB — CBC
HCT: 37.8 % (ref 36.0–46.0)
Hemoglobin: 12.1 g/dL (ref 12.0–15.0)
MCH: 26 pg (ref 26.0–34.0)
MCHC: 32 g/dL (ref 30.0–36.0)
MCV: 81.3 fL (ref 80.0–100.0)
Platelets: 415 10*3/uL — ABNORMAL HIGH (ref 150–400)
RBC: 4.65 MIL/uL (ref 3.87–5.11)
RDW: 15.5 % (ref 11.5–15.5)
WBC: 6.7 10*3/uL (ref 4.0–10.5)
nRBC: 0 % (ref 0.0–0.2)

## 2023-05-01 LAB — BASIC METABOLIC PANEL
Anion gap: 7 (ref 5–15)
BUN: 11 mg/dL (ref 6–20)
CO2: 24 mmol/L (ref 22–32)
Calcium: 9.4 mg/dL (ref 8.9–10.3)
Chloride: 105 mmol/L (ref 98–111)
Creatinine, Ser: 0.92 mg/dL (ref 0.44–1.00)
GFR, Estimated: >60 mL/min (ref >60)
Glucose, Bld: 105 mg/dL — ABNORMAL HIGH (ref 70–99)
Potassium: 3.9 mmol/L (ref 3.5–5.1)
Sodium: 136 mmol/L (ref 135–145)

## 2023-05-01 LAB — MAGNESIUM: Magnesium: 1.7 mg/dL (ref 1.7–2.4)

## 2023-05-01 MED ORDER — ACETAMINOPHEN 500 MG PO TABS
1000.0000 mg | ORAL_TABLET | Freq: Once | ORAL | Status: DC
  Filled 2023-05-01: qty 2

## 2023-05-01 MED ORDER — METHOCARBAMOL 500 MG PO TABS: 500.0000 mg | ORAL_TABLET | Freq: Two times a day (BID) | ORAL | 0 refills | Status: AC

## 2023-05-01 NOTE — ED Provider Notes (Signed)
[place] EMERGENCY DEPARTMENT AT [facility] Provider Note   CSN: [id number] Arrival date & time: 05/01/23  1853     History  Chief Complaint  Patient presents with   Leg Pain    Julie Cardenas is a 37 y.o. female.  This is a 37 year old female present emergency department left calf pain and swelling.  Reports symptoms started today.  States for the past month she has had intermittent cramping to her leg.  For different she was last night and has been more active lately.  She noticed today that her left leg was sore and she is having difficulty walking.  Noticed some swelling.  She reports history of elevated D-dimer in the past and is concerned she had blood clot.  Denies any chest pain and shortness of breath.  Has not taking any medications for her pain today.   Leg Pain      Home Medications Prior to Admission medications   Medication Sig Start Date End Date Taking? Authorizing Provider  methocarbamol (ROBAXIN) 500 MG tablet Take 1 tablet (500 mg total) by mouth 2 (two) times daily for 3 days. 05/01/23 05/04/23 Yes Ammarie Matsuura, Harmon Dun, DO  albuterol (VENTOLIN HFA) 108 (90 Base) MCG/ACT inhaler Inhale 1-2 puffs into the lungs every 6 (six) hours as needed for wheezing or shortness of breath. 04/03/21   Rhys Martini, PA-C  cetirizine (ZYRTEC) 10 MG tablet Take 1 tablet (10 mg total) by mouth daily. 02/03/21   Gustavus Bryant, FNP  docusate sodium (COLACE) 50 MG capsule Take 1 capsule (50 mg total) by mouth daily. 09/01/22   White, Elita Boone, NP  fluticasone furoate-vilanterol (BREO ELLIPTA) 200-25 MCG/ACT AEPB Inhale 200 mg into the lungs once.    [provider]  hydrocortisone (ANUSOL-HC) 2.5 % rectal cream Place 1 Application rectally 2 (two) times daily. 09/01/22   White, Elita Boone, NP  ibuprofen (ADVIL) 800 MG tablet Take 1 tablet (800 mg total) by mouth every 8 (eight) hours as needed (pain). 12/03/21   Zenia Resides, MD      Allergies    Naproxen, Other,  and Prednisone    Review of Systems   Review of Systems  Physical Exam Updated Vital Signs BP 123/76 (BP Location: Right Arm)   Pulse 90   Temp (!) 97.2 F (36.2 C)   Resp 16   Ht 5\' 5"  (1.651 m)   Wt 127 kg   LMP 04/24/2023 (Exact Date)   SpO2 100%   BMI 46.59 kg/m  Physical Exam Vitals and nursing note reviewed.  Constitutional:      General: She is not in acute distress.    Appearance: She is not toxic-appearing.  HENT:     Nose: Nose normal.     Mouth/Throat:     Mouth: Mucous membranes are moist.  Eyes:     Conjunctiva/sclera: Conjunctivae normal.  Cardiovascular:     Rate and Rhythm: Normal rate.  Pulmonary:     Effort: Pulmonary effort is normal.  Abdominal:     General: Abdomen is flat. There is no distension.  Musculoskeletal:     Comments: Minor swelling on left calf compared to right.  Some tenderness at the gastrocnemius belly.  Thompson test negative; Achilles appears to be intact.  Good strength.  Soft compartments.  2+ DP pulses bilaterally.    Skin:    Capillary Refill: Capillary refill takes less than 2 seconds.  Neurological:     Mental Status: She is alert  and oriented to person, place, and time.  Psychiatric:        Mood and Affect: Mood normal.        Behavior: Behavior normal.     ED Results / Procedures / Treatments   Labs (all labs ordered are listed, but only abnormal results are displayed) Labs Reviewed  CBC - Abnormal; Notable for the following components:      Result Value   Platelets 415 (*)    All other components within normal limits  BASIC METABOLIC PANEL - Abnormal; Notable for the following components:   Glucose, Bld 105 (*)    All other components within normal limits  MAGNESIUM    EKG None  Radiology US Venous Img Lower Unilateral Left  Result Date: 05/01/2023 CLINICAL DATA:  Evaluate for DVT.  Swelling. EXAM: LEFT LOWER EXTREMITY VENOUS DOPPLER ULTRASOUND TECHNIQUE: Gray-scale sonography with compression, as well  as color and duplex ultrasound, were performed to evaluate the deep venous system(s) from the level of the common femoral vein through the popliteal and proximal calf veins. COMPARISON:  None Available. FINDINGS: VENOUS Normal compressibility of the common femoral, superficial femoral, and popliteal veins, as well as the visualized calf veins. Visualized portions of profunda femoral vein and great saphenous vein unremarkable. No filling defects to suggest DVT on grayscale or color Doppler imaging. Doppler waveforms show normal direction of venous flow, normal respiratory plasticity and response to augmentation. Limited views of the contralateral common femoral vein are unremarkable. OTHER None. Limitations: none IMPRESSION: Negative. Electronically Signed   By: Darliss Cheney M.D.   On: 05/01/2023 20:29    Procedures Procedures    Medications Ordered in ED Medications  acetaminophen (TYLENOL) tablet 1,000 mg (1,000 mg Oral Not Given 05/01/23 2127)    ED Course/ Medical Decision Making/ A&P Clinical Course as of 05/01/23 2344  Sun May 01, 2023  2041 US Venous Img Lower Unilateral Left IMPRESSION: Negative.   [TY]    Clinical Course User Index [TY] Coral Spikes, DO                                 Medical Decision Making Well-appearing 37 year old female present emergency department for left calf pain and swelling.  She is obese, and reports history of elevated D-dimer in the past.  Did have some minor swelling compared to right.  Ultrasound negative for DVT however.  Given her complaints of frequent cramps concern for possible electrolyte derangement.  However basic metabolic panel normal.  Normal kidney function.  Magnesium level not elevated.  She has no leukocytosis to suggest systemic infection.  Exam today not consistent with cellulitis or infectious etiology.  Low suspicion for bony abnormality.  Discussed supportive care with stretches, NSAIDs.  Will prescribe short course of muscle  relaxer. Follow up with pcp/ortho Stable for discharge at this time.  Amount and/or Complexity of Data Reviewed Labs: ordered. Radiology:  Decision-making details documented in ED Course.  Risk OTC drugs. Prescription drug management.          Final Clinical Impression(s) / ED Diagnoses Final diagnoses:  Pain of left calf    Rx / DC Orders ED Discharge Orders          Ordered    methocarbamol (ROBAXIN) 500 MG tablet  2 times daily        05/01/23 2118              Jaclyn Andy,  Harmon Dun, DO 05/01/23 2344

## 2023-05-01 NOTE — ED Notes (Signed)
US at bedside

## 2023-05-01 NOTE — ED Triage Notes (Signed)
Pt POV from home reporting left calf pain + swelling that began today, reports experiencing leg cramping past week, worse today.

## 2023-05-01 NOTE — Discharge Instructions (Addendum)
Take Tylenol Motrin.  As discussed you may do stretches.  Please follow-up with your primary doctor.  You may use muscle relaxer as needed at night for cramps.  Please follow-up with your primary doctor.

## 2024-01-31 ENCOUNTER — Encounter: Payer: Self-pay | Admitting: Sports Medicine
